# Patient Record
Sex: Male | Born: 1970 | Race: Black or African American | Hispanic: No | Marital: Single | Smoking: Current every day smoker
Health system: Southern US, Community
[De-identification: ages and names within clinical notes are randomized; demographics above are authoritative.]

## PROBLEM LIST (undated history)

## (undated) ENCOUNTER — Emergency Department (HOSPITAL_BASED_OUTPATIENT_CLINIC_OR_DEPARTMENT_OTHER): Admission: EM | Payer: Commercial Managed Care - HMO | Source: Home / Self Care

## (undated) DIAGNOSIS — I2510 Atherosclerotic heart disease of native coronary artery without angina pectoris: Secondary | ICD-10-CM

## (undated) DIAGNOSIS — I509 Heart failure, unspecified: Secondary | ICD-10-CM

## (undated) DIAGNOSIS — I5022 Chronic systolic (congestive) heart failure: Secondary | ICD-10-CM

## (undated) DIAGNOSIS — I10 Essential (primary) hypertension: Secondary | ICD-10-CM

## (undated) HISTORY — PX: ABDOMINAL SURGERY: SHX537

---

## 2012-10-15 ENCOUNTER — Encounter (HOSPITAL_BASED_OUTPATIENT_CLINIC_OR_DEPARTMENT_OTHER): Payer: Self-pay | Admitting: *Deleted

## 2012-10-15 ENCOUNTER — Emergency Department (HOSPITAL_BASED_OUTPATIENT_CLINIC_OR_DEPARTMENT_OTHER)
Admission: EM | Admit: 2012-10-15 | Discharge: 2012-10-16 | Disposition: A | Discharge status: Home / Self Care | Payer: No Typology Code available for payment source | Attending: Emergency Medicine | Admitting: Emergency Medicine

## 2012-10-15 ENCOUNTER — Emergency Department (HOSPITAL_BASED_OUTPATIENT_CLINIC_OR_DEPARTMENT_OTHER): Payer: No Typology Code available for payment source

## 2012-10-15 DIAGNOSIS — F17200 Nicotine dependence, unspecified, uncomplicated: Secondary | ICD-10-CM | POA: Insufficient documentation

## 2012-10-15 DIAGNOSIS — S139XXA Sprain of joints and ligaments of unspecified parts of neck, initial encounter: Secondary | ICD-10-CM | POA: Insufficient documentation

## 2012-10-15 DIAGNOSIS — S39012A Strain of muscle, fascia and tendon of lower back, initial encounter: Secondary | ICD-10-CM

## 2012-10-15 DIAGNOSIS — Y9389 Activity, other specified: Secondary | ICD-10-CM | POA: Insufficient documentation

## 2012-10-15 DIAGNOSIS — Z79899 Other long term (current) drug therapy: Secondary | ICD-10-CM | POA: Insufficient documentation

## 2012-10-15 DIAGNOSIS — S239XXA Sprain of unspecified parts of thorax, initial encounter: Secondary | ICD-10-CM | POA: Insufficient documentation

## 2012-10-15 DIAGNOSIS — I10 Essential (primary) hypertension: Secondary | ICD-10-CM | POA: Insufficient documentation

## 2012-10-15 DIAGNOSIS — S161XXA Strain of muscle, fascia and tendon at neck level, initial encounter: Secondary | ICD-10-CM

## 2012-10-15 DIAGNOSIS — S335XXA Sprain of ligaments of lumbar spine, initial encounter: Secondary | ICD-10-CM | POA: Insufficient documentation

## 2012-10-15 DIAGNOSIS — S29019A Strain of muscle and tendon of unspecified wall of thorax, initial encounter: Secondary | ICD-10-CM

## 2012-10-15 DIAGNOSIS — Y92410 Unspecified street and highway as the place of occurrence of the external cause: Secondary | ICD-10-CM | POA: Insufficient documentation

## 2012-10-15 HISTORY — DX: Essential (primary) hypertension: I10
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
## 2012-10-16 MED ORDER — CYCLOBENZAPRINE HCL 10 MG PO TABS: 10.0000 mg | ORAL_TABLET | Freq: Three times a day (TID) | ORAL | Status: DC | PRN

## 2013-05-22 ENCOUNTER — Emergency Department (HOSPITAL_BASED_OUTPATIENT_CLINIC_OR_DEPARTMENT_OTHER)
Admission: EM | Admit: 2013-05-22 | Discharge: 2013-05-23 | Disposition: A | Discharge status: Home / Self Care | Payer: Self-pay | Attending: Emergency Medicine | Admitting: Emergency Medicine

## 2013-05-22 ENCOUNTER — Encounter (HOSPITAL_BASED_OUTPATIENT_CLINIC_OR_DEPARTMENT_OTHER): Payer: Self-pay | Admitting: Emergency Medicine

## 2013-05-22 DIAGNOSIS — Z79899 Other long term (current) drug therapy: Secondary | ICD-10-CM | POA: Insufficient documentation

## 2013-05-22 DIAGNOSIS — I10 Essential (primary) hypertension: Secondary | ICD-10-CM | POA: Insufficient documentation

## 2013-05-22 DIAGNOSIS — R071 Chest pain on breathing: Secondary | ICD-10-CM | POA: Insufficient documentation

## 2013-05-22 DIAGNOSIS — R0781 Pleurodynia: Secondary | ICD-10-CM

## 2013-05-22 DIAGNOSIS — F17200 Nicotine dependence, unspecified, uncomplicated: Secondary | ICD-10-CM | POA: Insufficient documentation

## 2013-05-22 LAB — PROTIME-INR
INR: 1.33 (ref 0.00–1.49)
Prothrombin Time: 16.2 seconds — ABNORMAL HIGH (ref 11.6–15.2)

## 2013-05-22 LAB — CBC WITH DIFFERENTIAL/PLATELET
Basophils Absolute: 0 10*3/uL (ref 0.0–0.1)
Basophils Relative: 0 % (ref 0–1)
Eosinophils Absolute: 0.2 10*3/uL (ref 0.0–0.7)
Eosinophils Relative: 4 % (ref 0–5)
HCT: 42.3 % (ref 39.0–52.0)
Hemoglobin: 14.4 g/dL (ref 13.0–17.0)
Lymphocytes Relative: 43 % (ref 12–46)
Lymphs Abs: 2.6 10*3/uL (ref 0.7–4.0)
MCH: 28.6 pg (ref 26.0–34.0)
MCHC: 34 g/dL (ref 30.0–36.0)
MCV: 83.9 fL (ref 78.0–100.0)
Monocytes Absolute: 0.8 10*3/uL (ref 0.1–1.0)
Monocytes Relative: 13 % — ABNORMAL HIGH (ref 3–12)
Neutro Abs: 2.4 10*3/uL (ref 1.7–7.7)
Neutrophils Relative %: 41 % — ABNORMAL LOW (ref 43–77)
Platelets: 207 10*3/uL (ref 150–400)
RBC: 5.04 MIL/uL (ref 4.22–5.81)
RDW: 14.7 % (ref 11.5–15.5)
WBC: 6 10*3/uL (ref 4.0–10.5)

## 2013-05-22 NOTE — ED Notes (Signed)
Chest pain since Thursday- right chest- worse with deep breath and laughing- states got sweaty during one episode

## 2013-05-23 ENCOUNTER — Emergency Department (HOSPITAL_BASED_OUTPATIENT_CLINIC_OR_DEPARTMENT_OTHER): Payer: Self-pay

## 2013-05-23 LAB — COMPREHENSIVE METABOLIC PANEL
ALT: 15 U/L (ref 0–53)
AST: 15 U/L (ref 0–37)
Albumin: 4.1 g/dL (ref 3.5–5.2)
Alkaline Phosphatase: 85 U/L (ref 39–117)
BUN: 12 mg/dL (ref 6–23)
CO2: 26 mEq/L (ref 19–32)
Calcium: 9.7 mg/dL (ref 8.4–10.5)
Chloride: 100 mEq/L (ref 96–112)
Creatinine, Ser: 1.3 mg/dL (ref 0.50–1.35)
GFR calc Af Amer: 75 mL/min — ABNORMAL LOW (ref >90)
GFR calc non Af Amer: 65 mL/min — ABNORMAL LOW (ref >90)
Glucose, Bld: 99 mg/dL (ref 70–99)
Potassium: 4 mEq/L (ref 3.7–5.3)
Sodium: 141 mEq/L (ref 137–147)
Total Bilirubin: 0.3 mg/dL (ref 0.3–1.2)
Total Protein: 8.6 g/dL — ABNORMAL HIGH (ref 6.0–8.3)

## 2013-05-23 LAB — TROPONIN I: Troponin I: <0.3 ng/mL (ref <0.30)

## 2013-05-23 LAB — D-DIMER, QUANTITATIVE: D-Dimer, Quant: 1.42 ug/mL-FEU — ABNORMAL HIGH (ref 0.00–0.48)

## 2013-05-23 MED ORDER — IOHEXOL 350 MG/ML SOLN
100.0000 mL | Freq: Once | INTRAVENOUS | Status: AC | PRN
  Administered 2013-05-23: 100 mL via INTRAVENOUS

## 2013-05-23 NOTE — ED Provider Notes (Addendum)
CSN: [id number]     Arrival date & time 05/22/13  2302 History   First MD Initiated Contact with Patient 05/23/13 0122     Chief Complaint  Patient presents with  . 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 He is here with chest pain for the past 4 days. The pain is on the right side anteriorly. The pain is pleuritic and he only feels it with deep breathing, laughing or coughing. He has had only an occasional cough, nonproductive. He has not had a fever that he is aware of but 3 days ago did have alternating feelings of warmth and chills. This lasted one day. The pain is moderate at worst. There is no associated shortness of breath, nausea, vomiting or diaphoresis. He denies lower extremity pain or swelling. He has no history of DVT.  Past Medical History  Diagnosis Date  . Hypertension    History reviewed. No pertinent past surgical history. No family history on file. History  Substance Use Topics  . Smoking status: Current Every Day Smoker    Types: Cigarettes  . Smokeless tobacco: Never Used  . Alcohol Use: 1.2 oz/week    2 Cans of beer per week    Review of Systems  All other systems reviewed and are negative.    Allergies  Review of patient's allergies indicates no known allergies.  Home Medications   Current Outpatient Rx  Name  Route  Sig  Dispense  Refill  . cyclobenzaprine (FLEXERIL) 10 MG tablet   Oral   Take 1 tablet (10 mg total) by mouth 3 (three) times daily as needed for muscle spasms.   15 tablet   0   . hydrochlorothiazide (HYDRODIURIL) 25 MG tablet   Oral   Take 25 mg by mouth daily.          BP 157/84  Pulse 97  Temp(Src) 99.1 F (37.3 C) (Oral)  Resp 17  Ht 6\' 2"  (1.88 m)  Wt 270 lb (122.471 kg)  BMI 34.65 kg/m2  SpO2 99%  Physical Exam General: Well-developed,  well-nourished male in no acute distress; appearance consistent with age of record HENT: normocephalic; atraumatic Eyes: pupils equal, round and reactive to light; extraocular muscles intact Neck: supple Heart: regular rate and rhythm; no murmurs, rubs or gallops Lungs: clear to auscultation bilaterally Chest: non-tender Abdomen: soft; nondistended; nontender; no masses or hepatosplenomegaly; bowel sounds present Extremities: No deformity; full range of motion; pulses normal; no edema Neurologic: Awake, alert and oriented; motor function intact in all extremities and symmetric; no facial droop Skin: Warm and dry Psychiatric: Normal mood and affect    ED Course  Procedures (including critical care time)  MDM    EKG Interpretation  Date/Time:  Sunday May 22 2013 23:12:00 EDT Ventricular Rate:  99 PR Interval:  158 QRS Duration: 86 QT Interval:  326 QTC Calculation: 418 R Axis:   28 Text Interpretation:  Normal sinus rhythm Possible Left atrial enlargement T wave abnormality, consider inferolateral ischemia Abnormal ECG No previous ECGs available Confirmed by Keishon Chavarin  MD, Jenny Reichmann ([id number]) on 05/23/2013 1:23:32 AM       Nursing notes and vitals signs, including pulse oximetry, reviewed.  Summary of this visit's results, reviewed by myself:  Labs:  Results for orders placed during the hospital encounter of 05/22/13 (from the past 24 hour(s))  CBC WITH DIFFERENTIAL     Status:  Abnormal   Collection Time    05/22/13 11:35 PM      Result Value Ref Range   WBC 6.0  4.0 - 10.5 K/uL   RBC 5.04  4.22 - 5.81 MIL/uL   Hemoglobin 14.4  13.0 - 17.0 g/dL   HCT 42.3  39.0 - 52.0 %   MCV 83.9  78.0 - 100.0 fL   MCH 28.6  26.0 - 34.0 pg   MCHC 34.0  30.0 - 36.0 g/dL   RDW 14.7  11.5 - 15.5 %   Platelets 207  150 - 400 K/uL   Neutrophils Relative % 41 (*) 43 - 77 %   Neutro Abs 2.4  1.7 - 7.7 K/uL   Lymphocytes Relative 43  12 - 46 %   Lymphs Abs 2.6  0.7 - 4.0 K/uL   Monocytes  Relative 13 (*) 3 - 12 %   Monocytes Absolute 0.8  0.1 - 1.0 K/uL   Eosinophils Relative 4  0 - 5 %   Eosinophils Absolute 0.2  0.0 - 0.7 K/uL   Basophils Relative 0  0 - 1 %   Basophils Absolute 0.0  0.0 - 0.1 K/uL  COMPREHENSIVE METABOLIC PANEL     Status: Abnormal   Collection Time    05/22/13 11:35 PM      Result Value Ref Range   Sodium 141  137 - 147 mEq/L   Potassium 4.0  3.7 - 5.3 mEq/L   Chloride 100  96 - 112 mEq/L   CO2 26  19 - 32 mEq/L   Glucose, Bld 99  70 - 99 mg/dL   BUN 12  6 - 23 mg/dL   Creatinine, Ser 1.30  0.50 - 1.35 mg/dL   Calcium 9.7  8.4 - 10.5 mg/dL   Total Protein 8.6 (*) 6.0 - 8.3 g/dL   Albumin 4.1  3.5 - 5.2 g/dL   AST 15  0 - 37 U/L   ALT 15  0 - 53 U/L   Alkaline Phosphatase 85  39 - 117 U/L   Total Bilirubin 0.3  0.3 - 1.2 mg/dL   GFR calc non Af Amer 65 (*) >90 mL/min   GFR calc Af Amer 75 (*) >90 mL/min  TROPONIN I     Status: None   Collection Time    05/22/13 11:35 PM      Result Value Ref Range   Troponin I <0.30  <0.30 ng/mL  PROTIME-INR     Status: Abnormal   Collection Time    05/22/13 11:35 PM      Result Value Ref Range   Prothrombin Time 16.2 (*) 11.6 - 15.2 seconds   INR 1.33  0.00 - 1.49  D-DIMER, QUANTITATIVE     Status: Abnormal   Collection Time    05/22/13 11:35 PM      Result Value Ref Range   D-Dimer, Quant 1.42 (*) 0.00 - 0.48 ug/mL-FEU   Will obtain CT Angio Chest due to pleuritic pain with elevated D-Dimer.  Imaging Studies: Dg Chest 2 View  05/23/2013   CLINICAL DATA:  Chest pain. Increasing with deep inspiration and laughing.  EXAM: CHEST  2 VIEW  COMPARISON:  No priors.  FINDINGS: Mild diffuse peribronchial cuffing and interstitial prominence. No acute consolidative airspace disease. No pleural effusions. No evidence of pulmonary edema. Heart size is normal. Mediastinal contours are unremarkable.  IMPRESSION: 1. Findings, as above, suggestive of mild acute bronchitis.   Electronically Signed   By: Quillian Quince  Entrikin M.D.   On: 05/23/2013 02:05   Ct Angio Chest W/cm &/or Wo Cm  05/23/2013   CLINICAL DATA:  Chest pain.  EXAM: CT ANGIOGRAPHY CHEST WITH CONTRAST  TECHNIQUE: Multidetector CT imaging of the chest was performed using the standard protocol during bolus administration of intravenous contrast. Multiplanar CT image reconstructions and MIPs were obtained to evaluate the vascular anatomy.  CONTRAST:  143mL OMNIPAQUE IOHEXOL 350 MG/ML SOLN  COMPARISON:  No priors.  FINDINGS: Mediastinum: No filling defects within the pulmonary arterial tree to suggest underlying pulmonary embolism. Heart size is normal. Probable left ventricular hypertrophy. Numerous borderline enlarged mediastinal lymph nodes. No definite pathologically enlarged mediastinal or hilar lymph nodes are noted. Esophagus is unremarkable in appearance.  Lungs/Pleura: Unremarkable. Description small amount of dependent atelectasis in the lower lobes of the lungs bilaterally. No consolidative airspace disease. No pleural effusions. No suspicious appearing pulmonary nodules or masses are identified.  Upper Abdomen: Unremarkable.  Musculoskeletal: There are no aggressive appearing lytic or blastic lesions noted in the visualized portions of the skeleton.  Review of the MIP images confirms the above findings.  IMPRESSION: 1. No evidence of pulmonary embolism. 2. No acute findings in the thorax to account for the patient's symptoms. 3. Probable left ventricular hypertrophy. This could be confirmed with followup nonemergent echocardiogram if clinically appropriate.   Electronically Signed   By: Vinnie Langton M.D.   On: 05/23/2013 03:07   3:12 AM No evidence of pulmonary embolism on CT. advised CT findings of enlarged heart which is likely the cause of his T wave inversions. He states he has PCP with whom he will follow up.     Wynetta Fines, MD 05/23/13 [id number]  Wynetta Fines, MD 05/23/13 [place], MD 05/23/13 [phone]

## 2013-05-23 NOTE — Discharge Instructions (Signed)

## 2014-12-20 ENCOUNTER — Encounter (HOSPITAL_BASED_OUTPATIENT_CLINIC_OR_DEPARTMENT_OTHER): Payer: Self-pay

## 2014-12-20 ENCOUNTER — Emergency Department (HOSPITAL_BASED_OUTPATIENT_CLINIC_OR_DEPARTMENT_OTHER)
Admission: EM | Admit: 2014-12-20 | Discharge: 2014-12-20 | Disposition: A | Discharge status: Home / Self Care | Payer: Self-pay | Attending: Emergency Medicine | Admitting: Emergency Medicine

## 2014-12-20 ENCOUNTER — Emergency Department (HOSPITAL_BASED_OUTPATIENT_CLINIC_OR_DEPARTMENT_OTHER): Payer: Self-pay

## 2014-12-20 DIAGNOSIS — I10 Essential (primary) hypertension: Secondary | ICD-10-CM | POA: Insufficient documentation

## 2014-12-20 DIAGNOSIS — S3992XA Unspecified injury of lower back, initial encounter: Secondary | ICD-10-CM | POA: Insufficient documentation

## 2014-12-20 DIAGNOSIS — Y92410 Unspecified street and highway as the place of occurrence of the external cause: Secondary | ICD-10-CM | POA: Insufficient documentation

## 2014-12-20 DIAGNOSIS — Y9389 Activity, other specified: Secondary | ICD-10-CM | POA: Insufficient documentation

## 2014-12-20 DIAGNOSIS — T25221A Burn of second degree of right foot, initial encounter: Secondary | ICD-10-CM | POA: Insufficient documentation

## 2014-12-20 DIAGNOSIS — Z720 Tobacco use: Secondary | ICD-10-CM | POA: Insufficient documentation

## 2014-12-20 DIAGNOSIS — T300 Burn of unspecified body region, unspecified degree: Secondary | ICD-10-CM

## 2014-12-20 DIAGNOSIS — Y998 Other external cause status: Secondary | ICD-10-CM | POA: Insufficient documentation

## 2014-12-20 DIAGNOSIS — X100XXA Contact with hot drinks, initial encounter: Secondary | ICD-10-CM | POA: Insufficient documentation

## 2014-12-20 MED ORDER — HYDROCHLOROTHIAZIDE 25 MG PO TABS: 25.0000 mg | ORAL_TABLET | Freq: Every day | ORAL | Status: DC

## 2014-12-20 MED ORDER — HYDROCHLOROTHIAZIDE 25 MG PO TABS
25.0000 mg | ORAL_TABLET | Freq: Once | ORAL | Status: AC
  Administered 2014-12-20: 25 mg via ORAL
  Filled 2014-12-20: qty 1

## 2014-12-20 MED ORDER — TETANUS-DIPHTH-ACELL PERTUSSIS 5-2.5-18.5 LF-MCG/0.5 IM SUSP: 0.5000 mL | Freq: Once | INTRAMUSCULAR | Status: DC

## 2014-12-20 MED ORDER — BACITRACIN ZINC 500 UNIT/GM EX OINT
TOPICAL_OINTMENT | Freq: Two times a day (BID) | CUTANEOUS | Status: DC
  Administered 2014-12-20: 1 via TOPICAL
  Filled 2014-12-20: qty 28.35

## 2014-12-20 MED ORDER — CEPHALEXIN 500 MG PO CAPS: 500.0000 mg | ORAL_CAPSULE | Freq: Four times a day (QID) | ORAL | Status: DC

## 2014-12-20 MED ORDER — BACITRACIN ZINC 500 UNIT/GM EX OINT: 1.0000 "application " | TOPICAL_OINTMENT | Freq: Two times a day (BID) | CUTANEOUS | Status: DC

## 2014-12-20 NOTE — ED Notes (Signed)
Pt denies ha, dizziness, eye pain or visual disturbances.
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
## 2014-12-20 NOTE — ED Notes (Signed)
Dr Wyvonnia Dusky aware of BP being elevated.

## 2015-06-06 ENCOUNTER — Encounter (HOSPITAL_BASED_OUTPATIENT_CLINIC_OR_DEPARTMENT_OTHER): Payer: Self-pay

## 2015-06-06 ENCOUNTER — Emergency Department (HOSPITAL_BASED_OUTPATIENT_CLINIC_OR_DEPARTMENT_OTHER)
Admission: EM | Admit: 2015-06-06 | Discharge: 2015-06-06 | Disposition: A | Discharge status: Home / Self Care | Payer: No Typology Code available for payment source | Attending: Emergency Medicine | Admitting: Emergency Medicine

## 2015-06-06 DIAGNOSIS — I10 Essential (primary) hypertension: Secondary | ICD-10-CM | POA: Insufficient documentation

## 2015-06-06 DIAGNOSIS — F17210 Nicotine dependence, cigarettes, uncomplicated: Secondary | ICD-10-CM | POA: Insufficient documentation

## 2015-06-06 DIAGNOSIS — J3489 Other specified disorders of nose and nasal sinuses: Secondary | ICD-10-CM | POA: Insufficient documentation

## 2015-06-06 MED ORDER — DIPHENHYDRAMINE HCL 25 MG PO TABS: 25.0000 mg | ORAL_TABLET | Freq: Four times a day (QID) | ORAL | Status: DC

## 2015-06-06 MED ORDER — HYDROCHLOROTHIAZIDE 25 MG PO TABS: 25.0000 mg | ORAL_TABLET | Freq: Every day | ORAL | Status: DC

## 2015-06-06 MED ORDER — FLUTICASONE PROPIONATE 50 MCG/ACT NA SUSP: 2.0000 | Freq: Every day | NASAL | Status: DC

## 2015-06-06 MED FILL — DIPHENHYDRAMINE 25 MG CAP: 25 | 25 days supply | Qty: 100 | Fill #0

## 2015-06-06 MED FILL — SM ALLERGY RELIEF 50 MCG SP: 50 MCG | 30 days supply | Qty: 16 | Fill #0

## 2015-06-06 MED FILL — HYDROCHLOROTHIAZIDE 25 MG T: 25 | 30 days supply | Qty: 30 | Fill #0

## 2015-06-06 NOTE — Discharge Instructions (Signed)
Hypertension Hypertension, commonly called high blood pressure, is when the force of blood pumping through your arteries is too strong. Your arteries are the blood vessels that carry blood from your heart throughout your body. A blood pressure reading consists of a higher number over a lower number, such as 110/72. The higher number (systolic) is the pressure inside your arteries when your heart pumps. The lower number (diastolic) is the pressure inside your arteries when your heart relaxes. Ideally you want your blood pressure below 120/80. Hypertension forces your heart to work harder to pump blood. Your arteries may become narrow or stiff. Having untreated or uncontrolled hypertension can cause heart attack, stroke, kidney disease, and other problems. RISK FACTORS Some risk factors for high blood pressure are controllable. Others are not.  Risk factors you cannot control include:   Race. You may be at higher risk if you are African American.  Age. Risk increases with age.  Gender. Men are at higher risk than women before age 45 years. After age 65, women are at higher risk than men. Risk factors you can control include:  Not getting enough exercise or physical activity.  Being overweight.  Getting too much fat, sugar, calories, or salt in your diet.  Drinking too much alcohol. SIGNS AND SYMPTOMS Hypertension does not usually cause signs or symptoms. Extremely high blood pressure (hypertensive crisis) may cause headache, anxiety, shortness of breath, and nosebleed. DIAGNOSIS To check if you have hypertension, your health care provider will measure your blood pressure while you are seated, with your arm held at the level of your heart. It should be measured at least twice using the same arm. Certain conditions can cause a difference in blood pressure between your right and left arms. A blood pressure reading that is higher than normal on one occasion does not mean that you need treatment. If  it is not clear whether you have high blood pressure, you may be asked to return on a different day to have your blood pressure checked again. Or, you may be asked to monitor your blood pressure at home for 1 or more weeks. TREATMENT Treating high blood pressure includes making lifestyle changes and possibly taking medicine. Living a healthy lifestyle can help lower high blood pressure. You may need to change some of your habits. Lifestyle changes may include:  Following the DASH diet. This diet is high in fruits, vegetables, and whole grains. It is low in salt, red meat, and added sugars.  Keep your sodium intake below 2,300 mg per day.  Getting at least 30-45 minutes of aerobic exercise at least 4 times per week.  Losing weight if necessary.  Not smoking.  Limiting alcoholic beverages.  Learning ways to reduce stress. Your health care provider may prescribe medicine if lifestyle changes are not enough to get your blood pressure under control, and if one of the following is true:  You are 18-59 years of age and your systolic blood pressure is above 140.  You are 60 years of age or older, and your systolic blood pressure is above 150.  Your diastolic blood pressure is above 90.  You have diabetes, and your systolic blood pressure is over 140 or your diastolic blood pressure is over 90.  You have kidney disease and your blood pressure is above 140/90.  You have heart disease and your blood pressure is above 140/90. Your personal target blood pressure may vary depending on your medical conditions, your age, and other factors. HOME CARE INSTRUCTIONS    Have your blood pressure rechecked as directed by your health care provider.   Take medicines only as directed by your health care provider. Follow the directions carefully. Blood pressure medicines must be taken as prescribed. The medicine does not work as well when you skip doses. Skipping doses also puts you at risk for  problems.  Do not smoke.   Monitor your blood pressure at home as directed by your health care provider. SEEK MEDICAL CARE IF:   You think you are having a reaction to medicines taken.  You have recurrent headaches or feel dizzy.  You have swelling in your ankles.  You have trouble with your vision. SEEK IMMEDIATE MEDICAL CARE IF:  You develop a severe headache or confusion.  You have unusual weakness, numbness, or feel faint.  You have severe chest or abdominal pain.  You vomit repeatedly.  You have trouble breathing. MAKE SURE YOU:   Understand these instructions.  Will watch your condition.  Will get help right away if you are not doing well or get worse.   This information is not intended to replace advice given to you by your health care provider. Make sure you discuss any questions you have with your health care provider.   Document Released: 02/17/2005 Document Revised: 07/04/2014 Document Reviewed: 12/10/2012 Elsevier Interactive Patient Education 2016 [employer]  

## 2015-06-06 NOTE — ED Notes (Signed)
C/o head congestion, sinus pain x 1 week

## 2015-06-06 NOTE — ED Notes (Signed)
Pt wanting refill for home BP medications.  Pt states that he cannot "get to his doctor right now".

## 2015-06-06 NOTE — ED Provider Notes (Signed)
CSN: [id number]     Arrival date & time 06/06/15  1207 History   First MD Initiated Contact with Patient 06/06/15 1224     Chief Complaint  Patient presents with  . Nasal Congestion     HPI Patient presents to the emergency department with complaints of nasal congestion over the past 2 weeks.  He states fullness sensation in his bilateral maxillary sinuses as well as radiating towards his bilateral ears.  He's had nasal congestion as well.  Denies sore throat.  No significant cough.  No chest pain shortness breath.  Denies abdominal pain.  He also requests a refill of his blood pressure medication.  He previously was on 25 mg hydrochlorothiazide daily.   Past Medical History  Diagnosis Date  . Hypertension    History reviewed. No pertinent past surgical history. No family history on file. Social History  Substance Use Topics  . Smoking status: Current Every Day Smoker -- 0.50 packs/day    Types: Cigarettes  . Smokeless tobacco: Never Used  . Alcohol Use: Yes     Comment: weekly    Review of Systems  All other systems reviewed and are negative.     Allergies  Review of patient's allergies indicates no known allergies.  Home Medications   Prior to Admission medications   Not on File   BP 184/123 mmHg  Pulse 93  Temp(Src) 98 F (36.7 C) (Oral)  Resp 18  Ht 6\' 1"  (1.854 m)  Wt 268 lb (121.564 kg)  BMI 35.37 kg/m2  SpO2 100% Physical Exam  Constitutional: He is oriented to person, place, and time. He appears well-developed and well-nourished.  HENT:  Head: Normocephalic and atraumatic.  Right Ear: External ear normal.  Left Ear: External ear normal.  Bilateral TMs normal.  No significant maxillary or frontal sinus tenderness.  No facial swelling.  Posterior pharynx is normal in appearance  Eyes: EOM are normal.  Neck: Normal range of motion.  Cardiovascular: Normal rate and regular rhythm.   Pulmonary/Chest: Effort normal and breath sounds normal. No respiratory  distress.  Abdominal: Soft. He exhibits no distension.  Musculoskeletal: Normal range of motion.  Neurological: He is alert and oriented to person, place, and time.  Skin: Skin is warm and dry.  Psychiatric: He has a normal mood and affect. Judgment normal.  Nursing note and vitals reviewed.   ED Course  Procedures (including critical care time) Labs Review Labs Reviewed - No data to display  Imaging Review No results found. I have personally reviewed and evaluated these images and lab results as part of my medical decision-making.   EKG Interpretation None      MDM   Final diagnoses:  None    Patient be placed on nasal decongestant.  No indication for antibiotics.  Doubt bacterial sinusitis.  Patient is given a refill of his hydrochlorothiazide.  I've informed him that he will need to see her primary care physician for ongoing prescriptions.    Jola Schmidt, MD 06/06/15 1250

## 2015-12-09 ENCOUNTER — Emergency Department (HOSPITAL_COMMUNITY)
Admission: EM | Admit: 2015-12-09 | Discharge: 2015-12-09 | Disposition: A | Discharge status: Home / Self Care | Payer: Self-pay | Attending: Emergency Medicine | Admitting: Emergency Medicine

## 2015-12-09 ENCOUNTER — Encounter (HOSPITAL_COMMUNITY): Payer: Self-pay | Admitting: *Deleted

## 2015-12-09 ENCOUNTER — Emergency Department (HOSPITAL_COMMUNITY): Payer: Self-pay

## 2015-12-09 DIAGNOSIS — K2900 Acute gastritis without bleeding: Secondary | ICD-10-CM | POA: Insufficient documentation

## 2015-12-09 DIAGNOSIS — F17210 Nicotine dependence, cigarettes, uncomplicated: Secondary | ICD-10-CM | POA: Insufficient documentation

## 2015-12-09 DIAGNOSIS — I10 Essential (primary) hypertension: Secondary | ICD-10-CM | POA: Insufficient documentation

## 2015-12-09 LAB — CBC
HCT: 40.1 % (ref 39.0–52.0)
Hemoglobin: 12.9 g/dL — ABNORMAL LOW (ref 13.0–17.0)
MCH: 27.3 pg (ref 26.0–34.0)
MCHC: 32.2 g/dL (ref 30.0–36.0)
MCV: 84.8 fL (ref 78.0–100.0)
Platelets: 257 10*3/uL (ref 150–400)
RBC: 4.73 MIL/uL (ref 4.22–5.81)
RDW: 14.6 % (ref 11.5–15.5)
WBC: 6.8 10*3/uL (ref 4.0–10.5)

## 2015-12-09 LAB — BASIC METABOLIC PANEL
Anion gap: 11 (ref 5–15)
BUN: 13 mg/dL (ref 6–20)
CO2: 24 mmol/L (ref 22–32)
Calcium: 8.7 mg/dL — ABNORMAL LOW (ref 8.9–10.3)
Chloride: 104 mmol/L (ref 101–111)
Creatinine, Ser: 1.27 mg/dL — ABNORMAL HIGH (ref 0.61–1.24)
GFR calc Af Amer: >60 mL/min (ref >60)
GFR calc non Af Amer: >60 mL/min (ref >60)
Glucose, Bld: 96 mg/dL (ref 65–99)
Potassium: 3.3 mmol/L — ABNORMAL LOW (ref 3.5–5.1)
Sodium: 139 mmol/L (ref 135–145)

## 2015-12-09 LAB — ETHANOL: Alcohol, Ethyl (B): 186 mg/dL — ABNORMAL HIGH (ref <5)

## 2015-12-09 LAB — TROPONIN I
Troponin I: <0.03 ng/mL (ref <0.03)
Troponin I: <0.03 ng/mL (ref <0.03)

## 2015-12-09 MED ORDER — SODIUM CHLORIDE 0.9 % IV BOLUS (SEPSIS)
1000.0000 mL | Freq: Once | INTRAVENOUS | Status: AC
  Administered 2015-12-09: 1000 mL via INTRAVENOUS

## 2015-12-09 MED ORDER — GI COCKTAIL ~~LOC~~
30.0000 mL | Freq: Once | ORAL | Status: AC
  Administered 2015-12-09: 30 mL via ORAL
  Filled 2015-12-09: qty 30

## 2015-12-09 MED ORDER — OMEPRAZOLE 20 MG PO CPDR: 20.0000 mg | DELAYED_RELEASE_CAPSULE | Freq: Every day | ORAL | 0 refills | Status: DC

## 2015-12-09 NOTE — ED Provider Notes (Signed)
[facility] DEPT Provider Note   CSN: [id number] Arrival date & time: 12/09/15  0241  By signing my name below, I, Clinton Johnson, attest that this documentation has been prepared under the direction and in the presence of Sema Stangler, MD. Electronically Signed: Hansel Johnson, ED Scribe. 12/09/15. 3:17 AM.     History   Chief Complaint Chief Complaint  Patient presents with  . Chest Pain    HPI Clinton Johnson is a 45 y.o. male with h/o HTN who presents to the Emergency Department complaining of moderate, substernal CP onset at 2:10 AM after eating a hot dog. Pt states he left the club and ate a hot dog, which was followed by immediate pain. He states he consumed large amounts of alcohol this evening (~15 crown and cokes). He denies illegal drug use. No daily medications. He denies vomiting, abdominal pain.   The history is provided by the patient. No language interpreter was used.  Chest Pain   This is a recurrent problem. The current episode started 1 to 2 hours ago. The problem occurs constantly. The problem has not changed since onset.The pain is associated with eating. The pain is present in the substernal region. The pain is moderate. The pain does not radiate. Duration of episode(s) is 1 hour. Pertinent negatives include no abdominal pain, no back pain, no claudication, no cough, no diaphoresis, no exertional chest pressure, no fever, no headaches, no hemoptysis, no irregular heartbeat, no leg pain, no lower extremity edema, no malaise/fatigue, no near-syncope, no numbness, no orthopnea, no palpitations, no shortness of breath, no sputum production, no syncope, no vomiting and no weakness. He has tried nothing for the symptoms. The treatment provided no relief. Risk factors include alcohol intake.  His past medical history is significant for hypertension.  Pertinent negatives for past medical history include no aneurysm and no Marfan's syndrome.  Pertinent negatives for family medical  history include: no aortic dissection.  Procedure history is negative for cardiac catheterization.    Past Medical History:  Diagnosis Date  . Hypertension     There are no active problems to display for this patient.   History reviewed. No pertinent surgical history.     Home Medications    Prior to Admission medications   Medication Sig Start Date End Date Taking? Authorizing Provider  diphenhydrAMINE (BENADRYL) 25 MG tablet Take 1 tablet (25 mg total) by mouth every 6 (six) hours. 06/06/15   Jola Schmidt, MD  fluticasone [facility]) 50 MCG/ACT nasal spray Place 2 sprays into both nostrils daily. 06/06/15   Jola Schmidt, MD  hydrochlorothiazide (HYDRODIURIL) 25 MG tablet Take 1 tablet (25 mg total) by mouth daily. 06/06/15   Jola Schmidt, MD    Family History No family history on file.  Social History Social History  Substance Use Topics  . Smoking status: Current Every Day Smoker    Packs/day: 0.50    Types: Cigarettes  . Smokeless tobacco: Never Used  . Alcohol use Yes     Comment: weekly     Allergies   Review of patient's allergies indicates no known allergies.   Review of Systems Review of Systems  Constitutional: Negative for diaphoresis, fever and malaise/fatigue.  Respiratory: Negative for cough, hemoptysis, sputum production, chest tightness and shortness of breath.   Cardiovascular: Positive for chest pain. Negative for palpitations, orthopnea, claudication, leg swelling, syncope and near-syncope.  Gastrointestinal: Negative for abdominal pain, diarrhea and vomiting.  Musculoskeletal: Negative for arthralgias and back pain.  Neurological: Negative for weakness,  numbness and headaches.  All other systems reviewed and are negative.    Physical Exam Updated Vital Signs BP 121/83   Pulse 104   Temp 98.9 F (37.2 C) (Oral)   Resp 18   Ht 6\' 1"  (1.854 m)   Wt 270 lb (122.5 kg)   SpO2 96%   BMI 35.62 kg/m   Physical Exam  Constitutional: He is  oriented to person, place, and time. He appears well-developed and well-nourished.  Intoxicated  HENT:  Head: Normocephalic and atraumatic.  Mouth/Throat: Oropharynx is clear and moist. No oropharyngeal exudate.  Moist mucous membranes.   Eyes: Conjunctivae and EOM are normal.  Pinpoint pupils.   Neck: Normal range of motion. Neck supple. No JVD present. No tracheal deviation present.  No carotid bruits. Trachea midline.   Cardiovascular: Normal rate, regular rhythm, normal heart sounds and intact distal pulses.  Exam reveals no gallop and no friction rub.   No murmur heard. RRR.   Pulmonary/Chest: Effort normal and breath sounds normal. No stridor. No respiratory distress. He has no wheezes. He has no rales. He exhibits no tenderness.  Lungs CTA bilaterally.   Abdominal: Soft. He exhibits no distension and no mass. There is no tenderness. There is no rebound and no guarding.  Hyperactive bowel sounds throughout.   Musculoskeletal: Normal range of motion. He exhibits no edema or tenderness.  No BLE edema. PTs intact.   Lymphadenopathy:    He has no cervical adenopathy.  Neurological: He is alert and oriented to person, place, and time. He has normal reflexes.  DTRs intact.   Skin: Skin is warm and dry.  Psychiatric: He has a normal mood and affect.  Nursing note and vitals reviewed.    ED Treatments / Results   Vitals:   12/09/15 0445 12/09/15 0600  BP: 134/77 121/79  Pulse: 92   Resp: (!) 28 20  Temp:     Results for orders placed or performed during the hospital encounter of [id number]  Basic metabolic panel  Result Value Ref Range   Sodium 139 135 - 145 mmol/L   Potassium 3.3 (L) 3.5 - 5.1 mmol/L   Chloride 104 101 - 111 mmol/L   CO2 24 22 - 32 mmol/L   Glucose, Bld 96 65 - 99 mg/dL   BUN 13 6 - 20 mg/dL   Creatinine, Ser 1.27 (H) 0.61 - 1.24 mg/dL   Calcium 8.7 (L) 8.9 - 10.3 mg/dL   GFR calc non Af Amer >60 >60 mL/min   GFR calc Af Amer >60 >60 mL/min   Anion gap  11 5 - 15  CBC  Result Value Ref Range   WBC 6.8 4.0 - 10.5 K/uL   RBC 4.73 4.22 - 5.81 MIL/uL   Hemoglobin 12.9 (L) 13.0 - 17.0 g/dL   HCT 40.1 39.0 - 52.0 %   MCV 84.8 78.0 - 100.0 fL   MCH 27.3 26.0 - 34.0 pg   MCHC 32.2 30.0 - 36.0 g/dL   RDW 14.6 11.5 - 15.5 %   Platelets 257 150 - 400 K/uL  Troponin I  Result Value Ref Range   Troponin I <0.03 <0.03 ng/mL  Ethanol  Result Value Ref Range   Alcohol, Ethyl (B) 186 (H) <5 mg/dL  Troponin I  Result Value Ref Range   Troponin I <0.03 <0.03 ng/mL   Dg Chest 2 View  Result Date: 12/09/2015 CLINICAL DATA:  Moderate substernal chest pain beginning at 2:10 a.m. Immediate pain began after eating a  hot dog. Alcohol use. EXAM: CHEST  2 VIEW COMPARISON:  05/23/2013 FINDINGS: Slightly shallow inspiration with linear atelectasis in the right lung base. Heart size and pulmonary vascularity are normal. Central peribronchial thickening and interstitial changes consistent with chronic bronchitis. No focal airspace disease or consolidation. No blunting of costophrenic angles. No pneumothorax. Mediastinal contours appear intact. Tortuous aorta. IMPRESSION: Shallow inspiration with linear atelectasis in the right lung base. Chronic bronchitic changes. No evidence of active pulmonary disease. Electronically Signed   By: Lucienne Capers M.D.   On: 12/09/2015 03:33     DIAGNOSTIC STUDIES: Oxygen Saturation is 96% on RA, adequate by my interpretation.    COORDINATION OF CARE: 3:15 AM Discussed treatment plan with pt at bedside which includes lab work and pt agreed to plan.   EKG  EKG Interpretation  Date/Time:  Sunday December 09 2015 02:46:15 EDT Ventricular Rate:  108 PR Interval:  148 QRS Duration: 98 QT Interval:  348 QTC Calculation: 466 R Axis:   28 Text Interpretation:  Sinus tachycardia with Premature ventricular complexes non specific st changes Confirmed by [facility]  MD, Yliana Gravois ([id number]) on 12/09/2015 3:02:14 AM        Procedures Procedures (including critical care time)  Medications  gi cocktail (Maalox,Lidocaine,Donnatal) (30 mLs Oral Given 12/09/15 0345)  sodium chloride 0.9 % bolus 1,000 mL (0 mLs Intravenous Stopped 12/09/15 0445)     Initial Impression / Assessment and Plan / ED Course  I have reviewed the triage vital signs and the nursing notes.  Pertinent labs & imaging results that were available during my care of the patient were reviewed by me and considered in my medical decision making (see chart for details).  Clinical Course    Sleeping soundly without complaints since meds given  Final Clinical Impressions(s) / ED Diagnoses    New Prescriptions New Prescriptions   No medications on file  HEART score is 1, low risk for mace.  SX consistent with gastritis likely from a combination of ETOH mixed with hot dog.  Symptoms atypical for cardiac pain ruled out with 2 normal troponins.  PERC negative wells 0 highly doubt pe.  No ETOH. Bland diet and take prilosec x 1 month. All questions answered to patient's satisfaction. Based on history and exam patient has been appropriately medically screened and emergency conditions excluded. Patient is stable for discharge at this time. Follow up with your PMD for recheck in 2 days and strict return precautions given.   I personally performed the services described in this documentation, which was scribed in my presence. The recorded information has been reviewed and is accurate.      Veatrice Kells, MD 12/09/15 [phone]

## 2015-12-09 NOTE — ED Triage Notes (Signed)
The pt was at the club and as he was leaving he developed chest pain central  No mother symptoms  He is intoxicated.  No previous history.  He thinks it was the hot dog he ate

## 2019-12-05 ENCOUNTER — Other Ambulatory Visit: Payer: Self-pay

## 2019-12-05 ENCOUNTER — Encounter (HOSPITAL_BASED_OUTPATIENT_CLINIC_OR_DEPARTMENT_OTHER): Payer: Self-pay | Admitting: Emergency Medicine

## 2019-12-05 ENCOUNTER — Emergency Department (HOSPITAL_BASED_OUTPATIENT_CLINIC_OR_DEPARTMENT_OTHER): Payer: Self-pay

## 2019-12-05 ENCOUNTER — Emergency Department (HOSPITAL_BASED_OUTPATIENT_CLINIC_OR_DEPARTMENT_OTHER)
Admission: EM | Admit: 2019-12-05 | Discharge: 2019-12-05 | Discharge status: Against Medical Advice | Payer: Self-pay | Attending: Emergency Medicine | Admitting: Emergency Medicine

## 2019-12-05 DIAGNOSIS — Z79899 Other long term (current) drug therapy: Secondary | ICD-10-CM | POA: Insufficient documentation

## 2019-12-05 DIAGNOSIS — R1011 Right upper quadrant pain: Secondary | ICD-10-CM | POA: Insufficient documentation

## 2019-12-05 DIAGNOSIS — D135 Benign neoplasm of extrahepatic bile ducts: Secondary | ICD-10-CM

## 2019-12-05 DIAGNOSIS — F17210 Nicotine dependence, cigarettes, uncomplicated: Secondary | ICD-10-CM | POA: Insufficient documentation

## 2019-12-05 DIAGNOSIS — I110 Hypertensive heart disease with heart failure: Secondary | ICD-10-CM | POA: Insufficient documentation

## 2019-12-05 DIAGNOSIS — I509 Heart failure, unspecified: Secondary | ICD-10-CM

## 2019-12-05 DIAGNOSIS — K824 Cholesterolosis of gallbladder: Secondary | ICD-10-CM | POA: Insufficient documentation

## 2019-12-05 LAB — COMPREHENSIVE METABOLIC PANEL
ALT: 24 U/L (ref 0–44)
AST: 27 U/L (ref 15–41)
Albumin: 3.8 g/dL (ref 3.5–5.0)
Alkaline Phosphatase: 71 U/L (ref 38–126)
Anion gap: 11 (ref 5–15)
BUN: 12 mg/dL (ref 6–20)
CO2: 23 mmol/L (ref 22–32)
Calcium: 8.7 mg/dL — ABNORMAL LOW (ref 8.9–10.3)
Chloride: 102 mmol/L (ref 98–111)
Creatinine, Ser: 1.25 mg/dL — ABNORMAL HIGH (ref 0.61–1.24)
GFR calc Af Amer: >60 mL/min (ref >60)
GFR calc non Af Amer: >60 mL/min (ref >60)
Glucose, Bld: 94 mg/dL (ref 70–99)
Potassium: 4 mmol/L (ref 3.5–5.1)
Sodium: 136 mmol/L (ref 135–145)
Total Bilirubin: 1.1 mg/dL (ref 0.3–1.2)
Total Protein: 7.6 g/dL (ref 6.5–8.1)

## 2019-12-05 LAB — CBC WITH DIFFERENTIAL/PLATELET
Abs Immature Granulocytes: 0.02 10*3/uL (ref 0.00–0.07)
Basophils Absolute: 0.1 10*3/uL (ref 0.0–0.1)
Basophils Relative: 1 %
Eosinophils Absolute: 0.2 10*3/uL (ref 0.0–0.5)
Eosinophils Relative: 3 %
HCT: 44.1 % (ref 39.0–52.0)
Hemoglobin: 14.2 g/dL (ref 13.0–17.0)
Immature Granulocytes: 0 %
Lymphocytes Relative: 46 %
Lymphs Abs: 2.7 10*3/uL (ref 0.7–4.0)
MCH: 27.6 pg (ref 26.0–34.0)
MCHC: 32.2 g/dL (ref 30.0–36.0)
MCV: 85.8 fL (ref 80.0–100.0)
Monocytes Absolute: 0.7 10*3/uL (ref 0.1–1.0)
Monocytes Relative: 13 %
Neutro Abs: 2.2 10*3/uL (ref 1.7–7.7)
Neutrophils Relative %: 37 %
Platelets: 247 10*3/uL (ref 150–400)
RBC: 5.14 MIL/uL (ref 4.22–5.81)
RDW: 16 % — ABNORMAL HIGH (ref 11.5–15.5)
WBC: 5.8 10*3/uL (ref 4.0–10.5)
nRBC: 0.3 % — ABNORMAL HIGH (ref 0.0–0.2)

## 2019-12-05 LAB — LIPASE, BLOOD: Lipase: 25 U/L (ref 11–51)

## 2019-12-05 LAB — BRAIN NATRIURETIC PEPTIDE: B Natriuretic Peptide: 633.8 pg/mL — ABNORMAL HIGH (ref 0.0–100.0)

## 2019-12-05 MED ORDER — HYDRALAZINE HCL 20 MG/ML IJ SOLN
10.0000 mg | Freq: Once | INTRAMUSCULAR | Status: AC
  Administered 2019-12-05: 10 mg via INTRAVENOUS
  Filled 2019-12-05: qty 1

## 2019-12-05 MED ORDER — FUROSEMIDE 10 MG/ML IJ SOLN
40.0000 mg | Freq: Once | INTRAMUSCULAR | Status: AC
Start: 2019-12-05 — End: 2019-12-05
  Administered 2019-12-05: 40 mg via INTRAVENOUS
  Filled 2019-12-05: qty 4

## 2019-12-05 NOTE — ED Notes (Signed)
Pt refusing to stay, offered food, drinks, & snacks which pt also refused. States he is tired of waiting and will go to a different hospital "that will actually do something for me". Attempted to explain that he is receiving care here, that his BP is concerning, & every hospital system is overwhelmed/there will be a wait. Pt adamant that he is ready to leave, verbalized understanding that he is leaving against medical advice. IV removed.

## 2019-12-05 NOTE — ED Triage Notes (Signed)
Pt here with multiple complaints. Poor historian. States that he's been SOB, ankles are swelling, has HTN, he's been put out of his house, worried about corona virus.

## 2019-12-05 NOTE — ED Provider Notes (Signed)
[facility] EMERGENCY DEPARTMENT Provider Note   CSN: [id number] Arrival date & time: 12/05/19  1014     History Chief Complaint  Patient presents with  . Multiple Complaints    Clinton Johnson is a 49 y.o. male past medical history of hypertension, MI with cardiac stent, presenting to the emergency department with complaint of 3 days of shortness of breath, lower extremity edema, right upper quadrant abdominal pain.  Patient states 3 days ago he noticed bilateral ankles were swollen, this is improved with elevation.  He does endorse increased sodium intake in his diet.  He is taking his blood pressures at the pharmacy and he has had high readings.  He is not been taking any blood pressure medications.  He states last year he was seen at a [facility] in [facility] for a heart attack where he had a stent placed.  He was treated with aspirin and a "blood thinner" at discharge, however has not been taking it since.  No known history of CHF.  Mild cough is noted. Denies associated chest pain or fevers.  Patient also complaining of intermittent right upper quadrant abdominal pain that is postprandial in nature.  No associated nausea or vomiting.  Past abdominal surgeries include repair of his bowels when he was a teenager after an injury during football.  He does have an umbilical hernia though has not been bothering him.  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  Currently he is just driving through town.  The history is provided by the patient.       Past Medical History:  Diagnosis Date  . Hypertension     Patient Active Problem List   Diagnosis Date Noted  . Acute exacerbation of CHF (congestive heart failure) ([place]) 12/05/2019    History reviewed. No pertinent surgical history.     History reviewed. No pertinent family history.  Social History   Tobacco Use  . Smoking status: Current Every Day Smoker     Packs/day: 0.50    Types: Cigarettes  . Smokeless tobacco: Never Used  Substance Use Topics  . Alcohol use: Yes    Comment: weekly  . Drug use: No    Home Medications Prior to Admission medications   Medication Sig Start Date End Date Taking? Authorizing Provider  diphenhydrAMINE (BENADRYL) 25 MG tablet Take 1 tablet (25 mg total) by mouth every 6 (six) hours. Patient not taking: Reported on 12/09/2015 06/06/15   Jola Schmidt, MD  fluticasone [facility]) 50 MCG/ACT nasal spray Place 2 sprays into both nostrils daily. Patient not taking: Reported on 12/09/2015 06/06/15   Jola Schmidt, MD  hydrochlorothiazide (HYDRODIURIL) 25 MG tablet Take 1 tablet (25 mg total) by mouth daily. Patient not taking: Reported on 12/09/2015 06/06/15   Jola Schmidt, MD  omeprazole (PRILOSEC) 20 MG capsule Take 1 capsule (20 mg total) by mouth daily. 12/09/15   Palumbo, April, MD    Allergies    Patient has no known allergies.  Review of Systems   Review of Systems  All other systems reviewed and are negative.   Physical Exam Updated Vital Signs BP (!) 183/139   Pulse 93   Temp 98.4 F (36.9 C)   Resp 18   SpO2 100%   Physical Exam Vitals and nursing note reviewed.  Constitutional:      General: He is not in acute distress.    Appearance: He is well-developed. He is obese.  HENT:  Head: Normocephalic and atraumatic.  Eyes:     Conjunctiva/sclera: Conjunctivae normal.  Cardiovascular:     Rate and Rhythm: Normal rate and regular rhythm.  Pulmonary:     Effort: Pulmonary effort is normal. No respiratory distress.     Comments: Diminished bilaterally Abdominal:     General: Bowel sounds are normal.     Palpations: Abdomen is soft.     Tenderness: There is abdominal tenderness. There is no guarding or rebound.     Hernia: A hernia (Easily reducible umbilical hernia ) is present.     Comments: Right upper quadrant tenderness, neg murphys.  Midline surgical scar to the epigastrium.    Musculoskeletal:     Comments: 2-3+ pitting edema BLE  Skin:    General: Skin is warm.  Neurological:     Mental Status: He is alert.  Psychiatric:        Behavior: Behavior normal.     ED Results / Procedures / Treatments   Labs (all labs ordered are listed, but only abnormal results are displayed) Labs Reviewed  BRAIN NATRIURETIC PEPTIDE - Abnormal; Notable for the following components:      Result Value   B Natriuretic Peptide 633.8 (*)    All other components within normal limits  CBC WITH DIFFERENTIAL/PLATELET - Abnormal; Notable for the following components:   RDW 16.0 (*)    nRBC 0.3 (*)    All other components within normal limits  COMPREHENSIVE METABOLIC PANEL - Abnormal; Notable for the following components:   Creatinine, Ser 1.25 (*)    Calcium 8.7 (*)    All other components within normal limits  RESPIRATORY PANEL BY RT PCR (FLU A&B, COVID)  LIPASE, BLOOD    EKG EKG Interpretation  Date/Time:  Monday December 05 2019 11:55:50 EDT Ventricular Rate:  90 PR Interval:    QRS Duration: 100 QT Interval:  414 QTC Calculation: 507 R Axis:   12 Text Interpretation: Sinus rhythm Probable left atrial enlargement Anterior infarct, old Borderline repolarization abnormality Prolonged QT interval No significant change since last tracing Confirmed by Gareth Morgan [phone]) on 12/05/2019 2:39:11 PM   Radiology DG Chest Port 1 View  Result Date: 12/05/2019 CLINICAL DATA:  Shortness of breath EXAM: PORTABLE CHEST 1 VIEW COMPARISON:  None. FINDINGS: Bilateral interstitial thickening and hazy right lower lobe airspace disease concerning for pulmonary edema versus pneumonia. No pleural effusion or pneumothorax. Stable cardiomediastinal silhouette. No aggressive osseous lesion. IMPRESSION: Bilateral interstitial thickening and hazy right lower lobe airspace disease concerning for pulmonary edema versus pneumonia. Electronically Signed   By: Kathreen Devoid   On: 12/05/2019 13:34    US Abdomen Limited RUQ  Result Date: 12/05/2019 CLINICAL DATA:  Right upper quadrant pain EXAM: ULTRASOUND ABDOMEN LIMITED RIGHT UPPER QUADRANT COMPARISON:  None. FINDINGS: Gallbladder: Mildly thickened appearance of the gallbladder wall, most pronounced near the gallbladder neck measuring 5.6 mm. There are areas of comet tail artifact at this location (image 44) suggesting adenomyomatosis. No hyperdense gallstones. No sonographic Murphy sign noted by sonographer. Common bile duct: Diameter: 5 mm. Liver: Liver measures 19.6 cm in length. No focal lesion identified. Within normal limits in parenchymal echogenicity. Portal vein is patent on color Doppler imaging with normal direction of blood flow towards the liver. Other: Suspect small right pleural effusion. IMPRESSION: 1. No sonographic evidence of cholecystitis. 2. Findings suggestive of gallbladder adenomyomatosis. 3. Liver is mildly enlarged measuring approximately 20 cm in length. 4. Small right pleural effusion. Electronically Signed   By: Hart Carwin  Plundo D.O.   On: 12/05/2019 14:04    Procedures Procedures (including critical care time)  Medications Ordered in ED Medications  furosemide (LASIX) injection 40 mg (40 mg Intravenous Given 12/05/19 1528)  hydrALAZINE (APRESOLINE) injection 10 mg (10 mg Intravenous Given 12/05/19 1526)    ED Course  I have reviewed the triage vital signs and the nursing notes.  Pertinent labs & imaging results that were available during my care of the patient were reviewed by me and considered in my medical decision making (see chart for details).  Clinical Course as of Dec 04 1853  Molli Knock Dec 05, 2019  1620 Consulted with hospitalist, accepting admission   [JR]    Clinical Course User Index [JR] Keegan Bensch, [place] N, PA-C   MDM Rules/Calculators/A&P                          Patient with history of CAD, hypertension, presenting with a complaint of lower extremity edema, orthopnea, dyspnea on exertion  over the last few days.  He also has separate complaint of right upper quadrant abdominal pain that is postprandial nature, comes and goes.  On exam, patient is hypertensive, in no acute distress.  Lung sounds are diminished, he has 2-3+ pitting edema to bilateral lower extremities, O2 saturation is normal on room air.  Symptoms are concerning for new onset CHF, laboratory work-up initiated with basic labs, BNP, EKG and chest x-ray.  Regarding patient's right upper quadrant abdominal pain, he has some tenderness in this location, no palpable primary in the right upper quadrant, though patient does have easily reducible umbilical hernia.  He has surgical scar to the midline epigastric region.  Right upper quadrant ultrasound obtained to evaluate for gallbladder pathology.  Chest x-ray with findings consistent with pulmonary edema versus early infiltrate.  Given patient's presentation, favor pulmonary edema.  BNP is also elevated at 630.  Treated with IV Lasix and hydralazine.  Right upper quadrant ultrasound consistent with gallbladder adenomyomatosis.  No surrounding inflammatory changes concerning for acute cholecystitis.  Lipase within normal limits.  No leukocytosis.  Given patient's lack of outpatient follow-up, and presentation consistent with new onset of CHF, recommend admission for IV diuresis, blood pressure management, echocardiogram.  Patient accepted to [facility] hospitalist service for admission.  Patient however requested he self swab the nasopharyngeal swab for Covid test.  Recommended against this due to lack of efficacy of the swab.  Patient stating he would prefer to drive himself to [facility] for admission, however explained to patient that it was not lack of transport to the other facility but that patient does not have a bed available currently. lPatient then stating he would like to leave to get food. Offered multiple options for a meal including food delivery. Patient ultimately decided  to leave AMA. Patient is aware of the risks of leaving and verbalized understanding.  Final Clinical Impression(s) / ED Diagnoses Final diagnoses:  RUQ abdominal pain  New onset of congestive heart failure ([place])  Adenomyomatosis of gallbladder    Rx / DC Orders ED Discharge Orders    None       Kariann Wecker, [place] N, PA-C 12/05/19 1904    Gareth Morgan, MD 12/05/19 2156

## 2019-12-05 NOTE — ED Notes (Signed)
Pt refused Covid swab.

## 2019-12-05 NOTE — ED Notes (Signed)
Pt refused Covid swab states he will go to [facility] and they  Will let him swab himself , pt has on his shirt and  Has pulled off all  Leads they are laying on the floor pulled off bp cuff and pulse ox

## 2019-12-06 ENCOUNTER — Telehealth: Payer: Self-pay | Admitting: Emergency Medicine

## 2020-01-21 ENCOUNTER — Encounter (HOSPITAL_BASED_OUTPATIENT_CLINIC_OR_DEPARTMENT_OTHER): Payer: Self-pay

## 2020-01-21 ENCOUNTER — Other Ambulatory Visit: Payer: Self-pay

## 2020-01-21 ENCOUNTER — Emergency Department (HOSPITAL_BASED_OUTPATIENT_CLINIC_OR_DEPARTMENT_OTHER)
Admission: EM | Admit: 2020-01-21 | Discharge: 2020-01-21 | Disposition: A | Discharge status: Home / Self Care | Payer: Self-pay | Attending: Emergency Medicine | Admitting: Emergency Medicine

## 2020-01-21 ENCOUNTER — Emergency Department (HOSPITAL_BASED_OUTPATIENT_CLINIC_OR_DEPARTMENT_OTHER): Payer: Self-pay

## 2020-01-21 DIAGNOSIS — I509 Heart failure, unspecified: Secondary | ICD-10-CM

## 2020-01-21 DIAGNOSIS — F17210 Nicotine dependence, cigarettes, uncomplicated: Secondary | ICD-10-CM | POA: Insufficient documentation

## 2020-01-21 DIAGNOSIS — I110 Hypertensive heart disease with heart failure: Secondary | ICD-10-CM | POA: Insufficient documentation

## 2020-01-21 DIAGNOSIS — I5023 Acute on chronic systolic (congestive) heart failure: Secondary | ICD-10-CM | POA: Insufficient documentation

## 2020-01-21 LAB — CBC WITH DIFFERENTIAL/PLATELET
Abs Immature Granulocytes: 0.01 10*3/uL (ref 0.00–0.07)
Basophils Absolute: 0.1 10*3/uL (ref 0.0–0.1)
Basophils Relative: 1 %
Eosinophils Absolute: 0.1 10*3/uL (ref 0.0–0.5)
Eosinophils Relative: 2 %
HCT: 48.8 % (ref 39.0–52.0)
Hemoglobin: 15.4 g/dL (ref 13.0–17.0)
Immature Granulocytes: 0 %
Lymphocytes Relative: 51 %
Lymphs Abs: 2.9 10*3/uL (ref 0.7–4.0)
MCH: 27.1 pg (ref 26.0–34.0)
MCHC: 31.6 g/dL (ref 30.0–36.0)
MCV: 85.8 fL (ref 80.0–100.0)
Monocytes Absolute: 0.7 10*3/uL (ref 0.1–1.0)
Monocytes Relative: 12 %
Neutro Abs: 2 10*3/uL (ref 1.7–7.7)
Neutrophils Relative %: 34 %
Platelets: 274 10*3/uL (ref 150–400)
RBC: 5.69 MIL/uL (ref 4.22–5.81)
RDW: 16 % — ABNORMAL HIGH (ref 11.5–15.5)
WBC: 5.8 10*3/uL (ref 4.0–10.5)
nRBC: 0 % (ref 0.0–0.2)

## 2020-01-21 LAB — BASIC METABOLIC PANEL
Anion gap: 10 (ref 5–15)
BUN: 14 mg/dL (ref 6–20)
CO2: 25 mmol/L (ref 22–32)
Calcium: 9 mg/dL (ref 8.9–10.3)
Chloride: 104 mmol/L (ref 98–111)
Creatinine, Ser: 1.29 mg/dL — ABNORMAL HIGH (ref 0.61–1.24)
GFR, Estimated: >60 mL/min (ref >60)
Glucose, Bld: 105 mg/dL — ABNORMAL HIGH (ref 70–99)
Potassium: 3.6 mmol/L (ref 3.5–5.1)
Sodium: 139 mmol/L (ref 135–145)

## 2020-01-21 LAB — BRAIN NATRIURETIC PEPTIDE: B Natriuretic Peptide: 760.4 pg/mL — ABNORMAL HIGH (ref 0.0–100.0)

## 2020-01-21 MED ORDER — LABETALOL HCL 5 MG/ML IV SOLN
10.0000 mg | Freq: Once | INTRAVENOUS | Status: AC
  Administered 2020-01-21: 10 mg via INTRAVENOUS
  Filled 2020-01-21: qty 4

## 2020-01-21 MED ORDER — HYDROCHLOROTHIAZIDE 25 MG PO TABS: 25.0000 mg | ORAL_TABLET | Freq: Every day | ORAL | 0 refills | Status: DC

## 2020-01-21 MED ORDER — FUROSEMIDE 10 MG/ML IJ SOLN
40.0000 mg | Freq: Once | INTRAMUSCULAR | Status: AC
  Administered 2020-01-21: 40 mg via INTRAVENOUS
  Filled 2020-01-21: qty 4

## 2020-01-21 NOTE — ED Provider Notes (Signed)
[facility] EMERGENCY DEPARTMENT Provider Note   CSN: [id number] Arrival date & time: 01/21/20  1439     History Chief Complaint  Patient presents with  . Shortness of Breath    Chistian Johnson is a 49 y.o. male who presents with concern for shortness of breath, lower extremity edema intermittently times approximately 1 month.  Patient is a Administrator, originally from [place], who was seen in the emergency department in October 2021 when he is experiencing the same symptoms.  He states that at that time he was told he had fluid in his lungs, and they recommend admitting to the hospital but he declined.   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  He states that he rarely has any swelling when he is holding off of work, elevating his legs.  Patient states that he has since moved to [facility]  Personally reviewed this patient's medical records.  Patient with history of MI with stent placement in November 2020, hypertension, but states he does not take his medications.  Patient was seen here in December 05, 2019 and diagnosed with heart failure exacerbation.  ED provider that time wanted to admit the patient to the hospital due to evidence of pulmonary edema on his chest x-ray, as well as elevated BNP.  Patient left AMA at that time, refusing to be admitted to the hospital.  He was administered IV Lasix and hydralazine while in the emergency department at that time, and states that these medications made him feel much better.  He is hoping that we can do the same thing today and sent him home.  Patient has not seen a cardiologist ever, does not have a primary care doctor, is noncompliant with his hypertension medications.  HPI     Past Medical History:  Diagnosis Date  . Hypertension     Patient Active Problem List   Diagnosis Date Noted  . Acute exacerbation of CHF (congestive heart  failure) ([place]) 12/05/2019    History reviewed. No pertinent surgical history.     History reviewed. No pertinent family history.  Social History   Tobacco Use  . Smoking status: Current Every Day Smoker    Packs/day: 0.50    Types: Cigarettes  . Smokeless tobacco: Never Used  Substance Use Topics  . Alcohol use: Not Currently  . Drug use: No    Home Medications Prior to Admission medications   Medication Sig Start Date End Date Taking? Authorizing Provider  diphenhydrAMINE (BENADRYL) 25 MG tablet Take 1 tablet (25 mg total) by mouth every 6 (six) hours. Patient not taking: Reported on 12/09/2015 06/06/15   Jola Schmidt, MD  fluticasone [facility]) 50 MCG/ACT nasal spray Place 2 sprays into both nostrils daily. Patient not taking: Reported on 12/09/2015 06/06/15   Jola Schmidt, MD  hydrochlorothiazide (HYDRODIURIL) 25 MG tablet Take 1 tablet (25 mg total) by mouth daily for 14 days. 01/21/20 02/04/20  Kathyjo Briere, Gypsy Balsam, PA-C  omeprazole (PRILOSEC) 20 MG capsule Take 1 capsule (20 mg total) by mouth daily. 12/09/15   Palumbo, April, MD    Allergies    Patient has no known allergies.  Review of Systems   Review of Systems  Constitutional: Positive for fatigue.  HENT: Negative.   Eyes: Negative.   Respiratory: Positive for chest tightness and shortness of breath. Negative for cough.   Cardiovascular: Positive for leg swelling. Negative for chest pain and  palpitations.  Gastrointestinal: Negative for abdominal pain, diarrhea, nausea and vomiting.  Genitourinary: Negative for dysuria, hematuria and urgency.  Musculoskeletal: Negative for arthralgias, back pain, gait problem, joint swelling and myalgias.  Skin: Negative.   Neurological: Negative for dizziness, syncope, facial asymmetry, speech difficulty, weakness, light-headedness and headaches.  Psychiatric/Behavioral: Negative.     Physical Exam Updated Vital Signs BP (!) 166/100   Pulse 88   Temp 98.4 F (36.9 C)    Resp 20   Ht 6\' 1"  (1.854 m)   Wt 122.5 kg   SpO2 100%   BMI 35.62 kg/m   Physical Exam Vitals and nursing note reviewed.  Constitutional:      Appearance: He is obese.  HENT:     Head: Normocephalic and atraumatic.     Nose: Nose normal.     Mouth/Throat:     Mouth: Mucous membranes are moist.     Pharynx: Oropharynx is clear. Uvula midline. No oropharyngeal exudate, posterior oropharyngeal erythema or uvula swelling.     Tonsils: No tonsillar exudate.  Eyes:     General:        Right eye: No discharge.        Left eye: No discharge.     Conjunctiva/sclera: Conjunctivae normal.     Pupils: Pupils are equal, round, and reactive to light.  Neck:     Trachea: Trachea and phonation normal.  Cardiovascular:     Rate and Rhythm: Regular rhythm. Tachycardia present.     Pulses:          Radial pulses are 2+ on the right side and 2+ on the left side.       Dorsalis pedis pulses are 2+ on the right side and 1+ on the left side.     Heart sounds: No murmur heard.   Pulmonary:     Effort: Pulmonary effort is normal. No respiratory distress.     Breath sounds: Examination of the right-middle field reveals rales. Examination of the right-lower field reveals rales. Examination of the left-lower field reveals rales. Rales present. No wheezing.  Chest:     Chest wall: No mass, deformity, tenderness, crepitus or edema.  Abdominal:     General: Bowel sounds are normal. There is no distension.     Tenderness: There is no abdominal tenderness.  Musculoskeletal:        General: No deformity.     Cervical back: Neck supple. No edema, rigidity or crepitus. No pain with movement, spinous process tenderness or muscular tenderness.     Right lower leg: 2+ Pitting Edema present.     Left lower leg: 1+ Pitting Edema present.  Lymphadenopathy:     Cervical: No cervical adenopathy.  Skin:    General: Skin is warm and dry.     Capillary Refill: Capillary refill takes less than 2 seconds.    Neurological:     General: No focal deficit present.     Mental Status: He is alert and oriented to person, place, and time. Mental status is at baseline.  Psychiatric:        Mood and Affect: Mood normal.     ED Results / Procedures / Treatments   Labs (all labs ordered are listed, but only abnormal results are displayed) Labs Reviewed  BASIC METABOLIC PANEL - Abnormal; Notable for the following components:      Result Value   Glucose, Bld 105 (*)    Creatinine, Ser 1.29 (*)    All other components within  normal limits  CBC WITH DIFFERENTIAL/PLATELET - Abnormal; Notable for the following components:   RDW 16.0 (*)    All other components within normal limits  BRAIN NATRIURETIC PEPTIDE - Abnormal; Notable for the following components:   B Natriuretic Peptide 760.4 (*)    All other components within normal limits    EKG EKG Interpretation  Date/Time:  Saturday January 21 2020 17:11:04 EST Ventricular Rate:  103 PR Interval:    QRS Duration: 100 QT Interval:  386 QTC Calculation: 506 R Axis:   6 Text Interpretation: Sinus tachycardia Ventricular premature complex Probable left atrial enlargement LVH with secondary repolarization abnormality Anterior infarct, old Prolonged QT interval Confirmed by Quintella Reichert [phone]) on 01/21/2020 5:14:20 PM   Radiology DG Chest 2 View  Result Date: 01/21/2020 CLINICAL DATA:  Shortness of breath and bilateral ankle swelling. EXAM: CHEST - 2 VIEW COMPARISON:  December 05, 2019 FINDINGS: Mild, diffusely increased interstitial lung markings are seen. This is stable in severity when compared to the prior study. There is no evidence of a pleural effusion or pneumothorax. The cardiac silhouette is moderately enlarged and unchanged in size. The visualized skeletal structures are unremarkable. IMPRESSION: Findings consistent with mild interstitial edema. Electronically Signed   By: Virgina Norfolk M.D.   On: 01/21/2020 18:55     Procedures Procedures (including critical care time)  Medications Ordered in ED Medications  furosemide (LASIX) injection 40 mg (40 mg Intravenous Given 01/21/20 1944)  labetalol (NORMODYNE) injection 10 mg (10 mg Intravenous Given 01/21/20 2009)    ED Course  I have reviewed the triage vital signs and the nursing notes.  Pertinent labs & imaging results that were available during my care of the patient were reviewed by me and considered in my medical decision making (see chart for details).    MDM Rules/Calculators/A&P                         49 year old male with history of CAD, hypertension, CHF who presents today with intermittent shortness of breath, lower extremity swelling for the last month.   Patient hypertensive on intake 174/98.  Patient remains normal.  Physical exam significant for rales in the lung bases bilaterally, 2+ pitting edema in lower extremities bilaterally, R>L. Patient appears to be short of breath while sitting in the exam room.   Proceed with CBC, BMP, BNP, chest x-ray, EKG.  EKG with sinus tachy, prolonged Qtc 506.  Chest x-ray mild interstitial edema.  Patient continues to be hypertensive, IV labetalol ordered.  IV Lasix ordered for interstitial edema.  CBC unremarkable, BMP creatinine elevated to 1.29, previously 1.25.  BNP elevated to 760.4  Ambulatory O2 sat >96%.  Given interstitial edema on chest x-ray, shortness of breath at rest, lower extremity edema, and hypertension I recommend admission to the hospital for this patient.  I discussed my disposition plan with the patient, who adamantly refuses admission to the hospital at this time.  He states he can come back tomorrow, he cannot be admitted to the hospital today because his sister's funeral is tomorrow.   I had extensive discussion with this patient regarding risks of leaving the emergency department [facility]. He voiced understanding of the risks of progressively  worsening congestive heart failure and fluid overload, as well as a cardiac implications of uncontrolled hypertension.  He continued to express wishes to be discharged home at this time.  He voiced understanding that he is leaving [facility].  I prescribed 2-week supply of antihypertensive medication HCTZ, which the patient was on in 2017 before he stopped taking all his medications at home.  I provided contact information for the cardiologist, recommend patient follow-up closely with them.  Erskin voiced understanding of his medical evaluation and treatment plan.  He again voiced understanding he is leaving the emergency [place].   Final Clinical Impression(s) / ED Diagnoses Final diagnoses:  Acute on chronic congestive heart failure, unspecified heart failure type [facility])    Rx / DC Orders ED Discharge Orders         Ordered    hydrochlorothiazide (HYDRODIURIL) 25 MG tablet  Daily        01/21/20 2019           Athel Merriweather, Sharlene Dory 01/21/20 2045    Quintella Reichert, MD 01/21/20 2159

## 2020-01-21 NOTE — ED Notes (Signed)
Patient transported to X-ray 

## 2020-01-21 NOTE — ED Notes (Signed)
Pt ambulated in the room with O2 sats of 96%the whole time.

## 2020-01-21 NOTE — Discharge Instructions (Signed)
You were evaluated in the emergency department today for your shortness of breath and leg swelling.  Your blood pressure was very high while in the emergency department.  Based on your blood work and your chest x-ray we determined that you are in congestive heart failure. This is where your heart is not working properly and can get fluid backed up into your lungs and your limbs.  You were administered a fluid medication called Lasix that will help you drain extra fluid in your body, as well as a blood pressure medication called labetalol through your IV while in the emergency department.  It was my recommendation that you be admitted to the hospital tonight, however you refused admission tonight.  This means that you are leaving [facility], and you are assuming the risks associated with leaving the hospital in your current condition.  Below is the contact information for the cardiologist.  Please call their office first thing Monday morning to schedule an appointment for evaluation for high blood pressure and heart failure.  Additionally I prescribed you 2 weeks worth of the blood pressure medication you have been on in the past.  You will need to see a cardiologist for a refill.  Please return immediately to the emergency department if develop chest pain, worsening shortness of breath, palpitations, lightheadedness, dizziness, or other new severe symptoms.

## 2020-01-21 NOTE — ED Notes (Signed)
Pt not in lobby when called, was phoned, supposed to be in car.

## 2020-01-21 NOTE — ED Notes (Addendum)
Pts o2 sat 88% after walking in from outside to pt room. Pt recovered to 98% after sitting down

## 2020-01-21 NOTE — ED Triage Notes (Signed)
Pt states he was seen a few months ago for swelling in legs and shortness of breath. 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 Pt reports that when he is home and not driving his breathing and legs are fine.

## 2020-03-02 ENCOUNTER — Other Ambulatory Visit: Payer: Self-pay

## 2020-03-02 ENCOUNTER — Encounter (HOSPITAL_BASED_OUTPATIENT_CLINIC_OR_DEPARTMENT_OTHER): Payer: Self-pay

## 2020-03-02 DIAGNOSIS — F17210 Nicotine dependence, cigarettes, uncomplicated: Secondary | ICD-10-CM | POA: Insufficient documentation

## 2020-03-02 DIAGNOSIS — I509 Heart failure, unspecified: Secondary | ICD-10-CM | POA: Insufficient documentation

## 2020-03-02 DIAGNOSIS — Z760 Encounter for issue of repeat prescription: Secondary | ICD-10-CM | POA: Insufficient documentation

## 2020-03-02 DIAGNOSIS — Z79899 Other long term (current) drug therapy: Secondary | ICD-10-CM | POA: Insufficient documentation

## 2020-03-02 DIAGNOSIS — I110 Hypertensive heart disease with heart failure: Secondary | ICD-10-CM | POA: Insufficient documentation

## 2020-03-02 NOTE — ED Triage Notes (Signed)
Pt c/o ShOB and fluid build up in his legs and abd. Pt has hx of CHF. Pt states he is out of his medication. Does not have a PCP.  Pt's SpO2 with ambulation was 96% on R/A.

## 2020-03-03 ENCOUNTER — Emergency Department (HOSPITAL_BASED_OUTPATIENT_CLINIC_OR_DEPARTMENT_OTHER)
Admission: EM | Admit: 2020-03-03 | Discharge: 2020-03-03 | Disposition: A | Discharge status: Home / Self Care | Payer: Self-pay | Attending: Emergency Medicine | Admitting: Emergency Medicine

## 2020-03-03 DIAGNOSIS — I509 Heart failure, unspecified: Secondary | ICD-10-CM

## 2020-03-03 DIAGNOSIS — R609 Edema, unspecified: Secondary | ICD-10-CM

## 2020-03-03 DIAGNOSIS — Z760 Encounter for issue of repeat prescription: Secondary | ICD-10-CM

## 2020-03-03 HISTORY — DX: Heart failure, unspecified: I50.9

## 2020-03-03 LAB — BASIC METABOLIC PANEL
Anion gap: 8 (ref 5–15)
BUN: 11 mg/dL (ref 6–20)
CO2: 26 mmol/L (ref 22–32)
Calcium: 8.7 mg/dL — ABNORMAL LOW (ref 8.9–10.3)
Chloride: 107 mmol/L (ref 98–111)
Creatinine, Ser: 1.33 mg/dL — ABNORMAL HIGH (ref 0.61–1.24)
GFR, Estimated: >60 mL/min (ref >60)
Glucose, Bld: 104 mg/dL — ABNORMAL HIGH (ref 70–99)
Potassium: 3.4 mmol/L — ABNORMAL LOW (ref 3.5–5.1)
Sodium: 141 mmol/L (ref 135–145)

## 2020-03-03 LAB — CBC WITH DIFFERENTIAL/PLATELET
Abs Immature Granulocytes: 0.02 10*3/uL (ref 0.00–0.07)
Basophils Absolute: 0.1 10*3/uL (ref 0.0–0.1)
Basophils Relative: 1 %
Eosinophils Absolute: 0.1 10*3/uL (ref 0.0–0.5)
Eosinophils Relative: 2 %
HCT: 45.8 % (ref 39.0–52.0)
Hemoglobin: 14.7 g/dL (ref 13.0–17.0)
Immature Granulocytes: 0 %
Lymphocytes Relative: 50 %
Lymphs Abs: 2.3 10*3/uL (ref 0.7–4.0)
MCH: 27.1 pg (ref 26.0–34.0)
MCHC: 32.1 g/dL (ref 30.0–36.0)
MCV: 84.5 fL (ref 80.0–100.0)
Monocytes Absolute: 0.6 10*3/uL (ref 0.1–1.0)
Monocytes Relative: 12 %
Neutro Abs: 1.7 10*3/uL (ref 1.7–7.7)
Neutrophils Relative %: 35 %
Platelets: 217 10*3/uL (ref 150–400)
RBC: 5.42 MIL/uL (ref 4.22–5.81)
RDW: 17.7 % — ABNORMAL HIGH (ref 11.5–15.5)
Smear Review: NORMAL
WBC Morphology: ABNORMAL
WBC: 4.7 10*3/uL (ref 4.0–10.5)
nRBC: 0.4 % — ABNORMAL HIGH (ref 0.0–0.2)

## 2020-03-03 MED ORDER — HYDROCHLOROTHIAZIDE 25 MG PO TABS
25.0000 mg | ORAL_TABLET | Freq: Every day | ORAL | 2 refills | Status: DC
Start: 2020-03-03 — End: 2020-05-15

## 2020-03-03 MED ORDER — HYDROCHLOROTHIAZIDE 25 MG PO TABS
25.0000 mg | ORAL_TABLET | Freq: Once | ORAL | Status: AC
  Administered 2020-03-03: 25 mg via ORAL
  Filled 2020-03-03: qty 1

## 2020-03-03 MED ORDER — FUROSEMIDE 10 MG/ML IJ SOLN
20.0000 mg | Freq: Once | INTRAMUSCULAR | Status: AC
  Administered 2020-03-03: 20 mg via INTRAVENOUS
  Filled 2020-03-03: qty 2

## 2020-03-03 NOTE — ED Provider Notes (Signed)
[facility] Provider Note: Lowella Dell, MD, FACEP  CSN: [id number] MRN: [id number] ARRIVAL: 03/02/20 at 2318 ROOM: MH01/MH01   CHIEF COMPLAINT  Shortness of Breath   HISTORY OF PRESENT ILLNESS  03/03/20 12:22 AM Clinton Johnson is a 50 y.o. male with history of hypertension and congestive heart failure.  He does not currently have a physician and recently ran out of his hydrochlorothiazide.  Since then he has developed edema of his legs, abdomen and dyspnea on exertion.  The edema is moderate the dyspnea exertion is mild.  He has not had any shortness of breath.  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  His edema improves when he is at home and more active.   Past Medical History:  Diagnosis Date  . CHF (congestive heart failure) (HCC)   . Hypertension     Past Surgical History:  Procedure Laterality Date  . ABDOMINAL SURGERY      No family history on file.  Social History   Tobacco Use  . Smoking status: Current Every Day Smoker    Packs/day: 0.50    Types: Cigarettes  . Smokeless tobacco: Never Used  Vaping Use  . Vaping Use: Never used  Substance Use Topics  . Alcohol use: Yes  . Drug use: No    Prior to Admission medications   Medication Sig Start Date End Date Taking? Authorizing Provider  hydrochlorothiazide (HYDRODIURIL) 25 MG tablet Take 1 tablet (25 mg total) by mouth daily. 03/03/20 04/02/20  Naviah Belfield, MD  omeprazole (PRILOSEC) 20 MG capsule Take 1 capsule (20 mg total) by mouth daily. 12/09/15   Palumbo, April, MD  diphenhydrAMINE (BENADRYL) 25 MG tablet Take 1 tablet (25 mg total) by mouth every 6 (six) hours. Patient not taking: Reported on 12/09/2015 06/06/15 03/03/20  Azalia Bilis, MD  fluticasone [facility]) 50 MCG/ACT nasal spray Place 2 sprays into both nostrils daily. Patient not taking: Reported on 12/09/2015 06/06/15 03/03/20  Azalia Bilis, MD    Allergies Patient has no known allergies.   REVIEW OF  SYSTEMS  Negative except as noted here or in the History of Present Illness.   PHYSICAL EXAMINATION  Initial Vital Signs Blood pressure (!) 165/135, pulse 95, temperature 99 F (37.2 C), temperature source Oral, resp. rate 20, height 6\' 1"  (1.854 m), weight 128.6 kg, SpO2 98 %.  Examination General: Well-developed, well-nourished male in no acute distress; appearance consistent with age of record HENT: normocephalic; atraumatic; no pharyngeal erythema or exudate Eyes: pupils equal, round and reactive to light; extraocular muscles intact Neck: supple Heart: regular rate and rhythm Lungs: clear to auscultation bilaterally Abdomen: soft; nondistended; nontender; bowel sounds present Extremities: No deformity; full range of motion; +1 pitting edema of lower legs Neurologic: Awake, alert and oriented; motor function intact in all extremities and symmetric; no facial droop Skin: Warm and dry Psychiatric: Normal mood and affect   RESULTS  Summary of this visit's results, reviewed and interpreted by myself:   EKG Interpretation  Date/Time:  Friday March 02 2020 23:31:40 EST Ventricular Rate:  96 PR Interval:  148 QRS Duration: 92 QT Interval:  404 QTC Calculation: 510 R Axis:   55 Text Interpretation: Normal sinus rhythm Possible Left atrial enlargement Anteroseptal infarct , age undetermined Prolonged QT Abnormal ECG No significant change was found Confirmed by [id number] (Paula Libra) on 03/02/2020 11:41:28 PM      Laboratory Studies: Results for orders placed or performed during the hospital encounter of  03/03/20 (from the past 24 hour(s))  Basic metabolic panel     Status: Abnormal   Collection Time: 03/03/20 12:42 AM  Result Value Ref Range   Sodium 141 135 - 145 mmol/L   Potassium 3.4 (L) 3.5 - 5.1 mmol/L   Chloride 107 98 - 111 mmol/L   CO2 26 22 - 32 mmol/L   Glucose, Bld 104 (H) 70 - 99 mg/dL   BUN 11 6 - 20 mg/dL   Creatinine, Ser 1.33 (H) 0.61 - 1.24 mg/dL    Calcium 8.7 (L) 8.9 - 10.3 mg/dL   GFR, Estimated >60 >60 mL/min   Anion gap 8 5 - 15  CBC with Differential/Platelet     Status: Abnormal   Collection Time: 03/03/20 12:42 AM  Result Value Ref Range   WBC 4.7 4.0 - 10.5 K/uL   RBC 5.42 4.22 - 5.81 MIL/uL   Hemoglobin 14.7 13.0 - 17.0 g/dL   HCT 45.8 39.0 - 52.0 %   MCV 84.5 80.0 - 100.0 fL   MCH 27.1 26.0 - 34.0 pg   MCHC 32.1 30.0 - 36.0 g/dL   RDW 17.7 (H) 11.5 - 15.5 %   Platelets 217 150 - 400 K/uL   nRBC 0.4 (H) 0.0 - 0.2 %   Neutrophils Relative % 35 %   Neutro Abs 1.7 1.7 - 7.7 K/uL   Lymphocytes Relative 50 %   Lymphs Abs 2.3 0.7 - 4.0 K/uL   Monocytes Relative 12 %   Monocytes Absolute 0.6 0.1 - 1.0 K/uL   Eosinophils Relative 2 %   Eosinophils Absolute 0.1 0.0 - 0.5 K/uL   Basophils Relative 1 %   Basophils Absolute 0.1 0.0 - 0.1 K/uL   WBC Morphology Abnormal lymphocytes present    RBC Morphology MORPHOLOGY UNREMARKABLE    Smear Review Normal platelet morphology    Immature Granulocytes 0 %   Abs Immature Granulocytes 0.02 0.00 - 0.07 K/uL   Imaging Studies: No results found.  ED COURSE and MDM  Nursing notes, initial and subsequent vitals signs, including pulse oximetry, reviewed and interpreted by myself.  Vitals:   03/02/20 2324 03/02/20 2328 03/03/20 0100  BP:  (!) 165/135 (!) 167/91  Pulse:  95 91  Resp:  20 18  Temp:  99 F (37.2 C) 99.1 F (37.3 C)  TempSrc:  Oral Oral  SpO2:  98% 98%  Weight: 128.6 kg    Height: 6\' 1"  (1.854 m)     Medications  furosemide (LASIX) injection 20 mg (20 mg Intravenous Given 03/03/20 0044)  hydrochlorothiazide (HYDRODIURIL) tablet 25 mg (25 mg Oral Given 03/03/20 0044)   1:51 AM Brisk diuresis after IV Lasix.  We will refill the patient's hydrochlorothiazide.  He was strongly encouraged to establish with a new PCP.   PROCEDURES  Procedures   ED DIAGNOSES     ICD-10-CM   1. Acute on chronic congestive heart failure, unspecified heart failure type ([place])   I50.9   2. Medication refill  Z76.0   3. Peripheral edema  R60.9        Louetta Hollingshead, Jenny Reichmann, MD 03/03/20 [phone]

## 2020-03-05 ENCOUNTER — Other Ambulatory Visit: Payer: Self-pay

## 2020-03-05 ENCOUNTER — Encounter (HOSPITAL_BASED_OUTPATIENT_CLINIC_OR_DEPARTMENT_OTHER): Payer: Self-pay

## 2020-03-05 DIAGNOSIS — Z79899 Other long term (current) drug therapy: Secondary | ICD-10-CM | POA: Insufficient documentation

## 2020-03-05 DIAGNOSIS — F17210 Nicotine dependence, cigarettes, uncomplicated: Secondary | ICD-10-CM | POA: Insufficient documentation

## 2020-03-05 DIAGNOSIS — I509 Heart failure, unspecified: Secondary | ICD-10-CM | POA: Insufficient documentation

## 2020-03-05 DIAGNOSIS — R0789 Other chest pain: Secondary | ICD-10-CM | POA: Insufficient documentation

## 2020-03-05 DIAGNOSIS — R1013 Epigastric pain: Secondary | ICD-10-CM | POA: Insufficient documentation

## 2020-03-05 DIAGNOSIS — I110 Hypertensive heart disease with heart failure: Secondary | ICD-10-CM | POA: Insufficient documentation

## 2020-03-05 DIAGNOSIS — R6881 Early satiety: Secondary | ICD-10-CM | POA: Insufficient documentation

## 2020-03-05 NOTE — ED Triage Notes (Signed)
Pt presents from home with abdominal pain. Reports that after drinking 1 cup or water or eating two cookies he is so "full" that he can't catch his breath or take a deep breath.

## 2020-03-06 ENCOUNTER — Emergency Department (HOSPITAL_BASED_OUTPATIENT_CLINIC_OR_DEPARTMENT_OTHER): Payer: Self-pay

## 2020-03-06 ENCOUNTER — Emergency Department (HOSPITAL_BASED_OUTPATIENT_CLINIC_OR_DEPARTMENT_OTHER)
Admission: EM | Admit: 2020-03-06 | Discharge: 2020-03-06 | Disposition: A | Discharge status: Home / Self Care | Payer: Self-pay | Attending: Emergency Medicine | Admitting: Emergency Medicine

## 2020-03-06 DIAGNOSIS — R6881 Early satiety: Secondary | ICD-10-CM

## 2020-03-06 DIAGNOSIS — R1013 Epigastric pain: Secondary | ICD-10-CM

## 2020-03-06 LAB — CBC WITH DIFFERENTIAL/PLATELET
Abs Immature Granulocytes: 0.02 10*3/uL (ref 0.00–0.07)
Basophils Absolute: 0.1 10*3/uL (ref 0.0–0.1)
Basophils Relative: 1 %
Eosinophils Absolute: 0.1 10*3/uL (ref 0.0–0.5)
Eosinophils Relative: 1 %
HCT: 47.8 % (ref 39.0–52.0)
Hemoglobin: 15.4 g/dL (ref 13.0–17.0)
Immature Granulocytes: 0 %
Lymphocytes Relative: 32 %
Lymphs Abs: 2.6 10*3/uL (ref 0.7–4.0)
MCH: 27.1 pg (ref 26.0–34.0)
MCHC: 32.2 g/dL (ref 30.0–36.0)
MCV: 84.2 fL (ref 80.0–100.0)
Monocytes Absolute: 1 10*3/uL (ref 0.1–1.0)
Monocytes Relative: 12 %
Neutro Abs: 4.5 10*3/uL (ref 1.7–7.7)
Neutrophils Relative %: 54 %
Platelets: 223 10*3/uL (ref 150–400)
RBC: 5.68 MIL/uL (ref 4.22–5.81)
RDW: 17.6 % — ABNORMAL HIGH (ref 11.5–15.5)
WBC: 8.2 10*3/uL (ref 4.0–10.5)
nRBC: 0 % (ref 0.0–0.2)

## 2020-03-06 LAB — LIPASE, BLOOD: Lipase: 19 U/L (ref 11–51)

## 2020-03-06 LAB — COMPREHENSIVE METABOLIC PANEL
ALT: 17 U/L (ref 0–44)
AST: 14 U/L — ABNORMAL LOW (ref 15–41)
Albumin: 3.8 g/dL (ref 3.5–5.0)
Alkaline Phosphatase: 87 U/L (ref 38–126)
Anion gap: 12 (ref 5–15)
BUN: 13 mg/dL (ref 6–20)
CO2: 25 mmol/L (ref 22–32)
Calcium: 8.9 mg/dL (ref 8.9–10.3)
Chloride: 100 mmol/L (ref 98–111)
Creatinine, Ser: 1.31 mg/dL — ABNORMAL HIGH (ref 0.61–1.24)
GFR, Estimated: >60 mL/min (ref >60)
Glucose, Bld: 105 mg/dL — ABNORMAL HIGH (ref 70–99)
Potassium: 3.5 mmol/L (ref 3.5–5.1)
Sodium: 137 mmol/L (ref 135–145)
Total Bilirubin: 1.3 mg/dL — ABNORMAL HIGH (ref 0.3–1.2)
Total Protein: 7.9 g/dL (ref 6.5–8.1)

## 2020-03-06 MED ORDER — IOHEXOL 300 MG/ML  SOLN
100.0000 mL | Freq: Once | INTRAMUSCULAR | Status: AC | PRN
  Administered 2020-03-06: 100 mL via INTRAVENOUS

## 2020-03-06 MED ORDER — SODIUM CHLORIDE 0.9 % IV BOLUS
1000.0000 mL | Freq: Once | INTRAVENOUS | Status: AC
  Administered 2020-03-06: 1000 mL via INTRAVENOUS

## 2020-03-06 MED ORDER — FUROSEMIDE 20 MG PO TABS: 20.0000 mg | ORAL_TABLET | Freq: Every day | ORAL | 0 refills | Status: DC

## 2020-03-06 NOTE — ED Provider Notes (Signed)
[facility] EMERGENCY DEPARTMENT Provider Note   CSN: [id number] Arrival date & time: 03/05/20  2332     History Chief Complaint  Patient presents with  . Abdominal Pain    Clinton Johnson is a 50 y.o. male.  Patient is a 50 year old male with history of CHF, hypertension, and prior abdominal surgery related to a traumatic football injury.  Patient presents today for evaluation of upper abdominal pain that has been occurring intermittently over the past week.  He describes feeling extremely full like "eating at a buffet a restaurant" after he eats just a small amount of food.  He denies any weight loss.  He denies any bowel or bladder complaints.  He was seen here with similar complaints 5 days ago.  He was given laxatives, however this has not helped.  He denies bloody stool.  He denies fevers or chills.  The history is provided by the patient.  Abdominal Pain Pain location:  Epigastric Pain quality: cramping   Pain radiates to:  Does not radiate Pain severity:  Moderate Duration:  5 days Timing:  Intermittent Progression:  Worsening Chronicity:  New Relieved by:  Nothing Worsened by:  Eating      Past Medical History:  Diagnosis Date  . CHF (congestive heart failure) (HCC)   . Hypertension     Patient Active Problem List   Diagnosis Date Noted  . Acute exacerbation of CHF (congestive heart failure) (HCC) 12/05/2019    Past Surgical History:  Procedure Laterality Date  . ABDOMINAL SURGERY         History reviewed. No pertinent family history.  Social History   Tobacco Use  . Smoking status: Current Every Day Smoker    Packs/day: 0.50    Types: Cigarettes  . Smokeless tobacco: Never Used  Vaping Use  . Vaping Use: Never used  Substance Use Topics  . Alcohol use: Yes  . Drug use: No    Home Medications Prior to Admission medications   Medication Sig Start Date End Date Taking? Authorizing Provider  hydrochlorothiazide (HYDRODIURIL) 25 MG  tablet Take 1 tablet (25 mg total) by mouth daily. 03/03/20 04/02/20  Molpus, John, MD  omeprazole (PRILOSEC) 20 MG capsule Take 1 capsule (20 mg total) by mouth daily. 12/09/15   Palumbo, April, MD  diphenhydrAMINE (BENADRYL) 25 MG tablet Take 1 tablet (25 mg total) by mouth every 6 (six) hours. Patient not taking: Reported on 12/09/2015 06/06/15 03/03/20  Azalia Bilis, MD  fluticasone [facility]) 50 MCG/ACT nasal spray Place 2 sprays into both nostrils daily. Patient not taking: Reported on 12/09/2015 06/06/15 03/03/20  Azalia Bilis, MD    Allergies    Patient has no known allergies.  Review of Systems   Review of Systems  Gastrointestinal: Positive for abdominal pain.  All other systems reviewed and are negative.   Physical Exam Updated Vital Signs BP (!) 175/95 (BP Location: Left Arm)   Pulse 98   Temp 98.6 F (37 C) (Oral)   Resp 18   Ht 6\' 1"  (1.854 m)   Wt 128.6 kg   SpO2 100%   BMI 37.40 kg/m   Physical Exam Vitals and nursing note reviewed.  Constitutional:      General: He is not in acute distress.    Appearance: He is well-developed and well-nourished. He is not diaphoretic.  HENT:     Head: Normocephalic and atraumatic.     Mouth/Throat:     Mouth: Oropharynx is clear and moist.  Cardiovascular:  Rate and Rhythm: Normal rate and regular rhythm.     Heart sounds: No murmur heard. No friction rub.  Pulmonary:     Effort: Pulmonary effort is normal. No respiratory distress.     Breath sounds: Normal breath sounds. No wheezing or rales.  Abdominal:     General: Bowel sounds are normal. There is no distension.     Palpations: Abdomen is soft.     Tenderness: There is abdominal tenderness in the epigastric area. There is no guarding or rebound.     Comments: There is mild tenderness in the epigastric region.  There is a prior surgical scar in this area, but appears to be normal in appearance.  He has an umbilical hernia that is reducible and nontender.  Musculoskeletal:         General: No edema. Normal range of motion.     Cervical back: Normal range of motion and neck supple.  Skin:    General: Skin is warm and dry.  Neurological:     Mental Status: He is alert and oriented to person, place, and time.     Coordination: Coordination normal.     ED Results / Procedures / Treatments   Labs (all labs ordered are listed, but only abnormal results are displayed) Labs Reviewed  COMPREHENSIVE METABOLIC PANEL  CBC WITH DIFFERENTIAL/PLATELET  LIPASE, BLOOD  URINALYSIS, ROUTINE W REFLEX MICROSCOPIC    EKG None  Radiology No results found.  Procedures Procedures (including critical care time)  Medications Ordered in ED Medications  sodium chloride 0.9 % bolus 1,000 mL (has no administration in time range)    ED Course  I have reviewed the triage vital signs and the nursing notes.  Pertinent labs & imaging results that were available during my care of the patient were reviewed by me and considered in my medical decision making (see chart for details).    MDM Rules/Calculators/A&P  Patient is a 50 year old male presenting with complaints of early satiety.  He tells me he eats 2 cookies and feels as though he is eating a full meal.  He is not describing any pain or fever.  Patient's work-up reveals unremarkable laboratory studies.  CT of his abdomen and chest shows trace pulmonary edema and small bilateral pleural effusions, but no acute intra-abdominal pathology.  I am uncertain as to how the trace pulmonary edema relates to his eating issues, but I suspect are unrelated.  Patient tells me he did have a MI in the past and apparently was diagnosed with heart failure.  He had been on Lasix, however has been off of this for many months.  I will restart his Lasix and place a referral for gastroenterology.  Patient may benefit from an endoscopy to evaluate for gastritis or other abnormality that would cause him to feel full.  Final Clinical  Impression(s) / ED Diagnoses Final diagnoses:  None    Rx / DC Orders ED Discharge Orders    None       Veryl Speak, MD 03/06/20 [phone]

## 2020-03-06 NOTE — Discharge Instructions (Signed)
Resume taking your Lasix as previously prescribed.  This medication has been refilled for you today.  Follow-up with gastroenterology in the next week for a recheck if symptoms or not improving.  The contact information for [facility] gastroenterology has been provided in this discharge summary for you to call and make these arrangements.

## 2020-05-07 ENCOUNTER — Emergency Department (HOSPITAL_BASED_OUTPATIENT_CLINIC_OR_DEPARTMENT_OTHER)
Admission: EM | Admit: 2020-05-07 | Discharge: 2020-05-07 | Disposition: A | Discharge status: Home / Self Care | Payer: Self-pay | Attending: Emergency Medicine | Admitting: Emergency Medicine

## 2020-05-07 ENCOUNTER — Other Ambulatory Visit: Payer: Self-pay

## 2020-05-07 ENCOUNTER — Encounter (HOSPITAL_BASED_OUTPATIENT_CLINIC_OR_DEPARTMENT_OTHER): Payer: Self-pay | Admitting: *Deleted

## 2020-05-07 ENCOUNTER — Emergency Department (HOSPITAL_BASED_OUTPATIENT_CLINIC_OR_DEPARTMENT_OTHER): Payer: Self-pay

## 2020-05-07 DIAGNOSIS — I509 Heart failure, unspecified: Secondary | ICD-10-CM | POA: Insufficient documentation

## 2020-05-07 DIAGNOSIS — I110 Hypertensive heart disease with heart failure: Secondary | ICD-10-CM | POA: Insufficient documentation

## 2020-05-07 DIAGNOSIS — F17210 Nicotine dependence, cigarettes, uncomplicated: Secondary | ICD-10-CM | POA: Insufficient documentation

## 2020-05-07 LAB — BASIC METABOLIC PANEL
Anion gap: 7 (ref 5–15)
BUN: 11 mg/dL (ref 6–20)
CO2: 27 mmol/L (ref 22–32)
Calcium: 8.5 mg/dL — ABNORMAL LOW (ref 8.9–10.3)
Chloride: 105 mmol/L (ref 98–111)
Creatinine, Ser: 1.32 mg/dL — ABNORMAL HIGH (ref 0.61–1.24)
GFR, Estimated: >60 mL/min (ref >60)
Glucose, Bld: 118 mg/dL — ABNORMAL HIGH (ref 70–99)
Potassium: 3.2 mmol/L — ABNORMAL LOW (ref 3.5–5.1)
Sodium: 139 mmol/L (ref 135–145)

## 2020-05-07 LAB — CBC
HCT: 41.8 % (ref 39.0–52.0)
Hemoglobin: 13.7 g/dL (ref 13.0–17.0)
MCH: 27.6 pg (ref 26.0–34.0)
MCHC: 32.8 g/dL (ref 30.0–36.0)
MCV: 84.3 fL (ref 80.0–100.0)
Platelets: 234 10*3/uL (ref 150–400)
RBC: 4.96 MIL/uL (ref 4.22–5.81)
RDW: 18.9 % — ABNORMAL HIGH (ref 11.5–15.5)
WBC: 5.8 10*3/uL (ref 4.0–10.5)
nRBC: 0.3 % — ABNORMAL HIGH (ref 0.0–0.2)

## 2020-05-07 LAB — TROPONIN I (HIGH SENSITIVITY): Troponin I (High Sensitivity): 24 ng/L — ABNORMAL HIGH (ref <18)

## 2020-05-07 LAB — BRAIN NATRIURETIC PEPTIDE: B Natriuretic Peptide: 760 pg/mL — ABNORMAL HIGH (ref 0.0–100.0)

## 2020-05-07 MED ORDER — FUROSEMIDE 20 MG PO TABS: 20.0000 mg | ORAL_TABLET | Freq: Every day | ORAL | 0 refills | Status: DC

## 2020-05-07 MED ORDER — POTASSIUM CHLORIDE ER 10 MEQ PO TBCR: 10.0000 meq | EXTENDED_RELEASE_TABLET | Freq: Every day | ORAL | 0 refills | Status: DC

## 2020-05-07 MED ORDER — ALBUTEROL SULFATE HFA 108 (90 BASE) MCG/ACT IN AERS: 2.0000 | INHALATION_SPRAY | RESPIRATORY_TRACT | Status: DC | PRN

## 2020-05-07 MED ORDER — POTASSIUM CHLORIDE CRYS ER 20 MEQ PO TBCR
40.0000 meq | EXTENDED_RELEASE_TABLET | Freq: Once | ORAL | Status: AC
  Administered 2020-05-07: 40 meq via ORAL
  Filled 2020-05-07: qty 2

## 2020-05-07 MED ORDER — FUROSEMIDE 10 MG/ML IJ SOLN
40.0000 mg | Freq: Once | INTRAMUSCULAR | Status: AC
  Administered 2020-05-07: 40 mg via INTRAVENOUS
  Filled 2020-05-07: qty 4

## 2020-05-07 NOTE — Progress Notes (Signed)
Patient is not wheezing at this time and his BBS are clear.  Patient states that he has swelling in his legs but is not in distress.  RT will continue to monitor.

## 2020-05-07 NOTE — ED Provider Notes (Signed)
[facility] Provider Note   CSN: [id number] Arrival date & time: 05/07/20  2028     History Chief Complaint  Patient presents with  . Shortness of Breath    Clinton Johnson is a 50 y.o. male.  The history is provided by the patient and medical records.   Clinton Johnson is a 50 y.o. male who presents to the Emergency Department complaining of swelling, sob.  He presents to the ED complaining of three days of progressive lower extremity edema and sob.  He ran out of his meds a few days ago.  No chest pain, fever.   Works as a Administrator.  Gets fatigued easily over the last few days.  Has a cough, productive of mucous.   He works as a Administrator. He does not have a cardiologist. He states that two years ago he was admitted to a hospital in [facility] and had a stent placed in his heart. He gets his medications from the emergency department. He has not been vaccinated for COVID-19. No known sick contacts.    Past Medical History:  Diagnosis Date  . CHF (congestive heart failure) ([place])   . Hypertension     Patient Active Problem List   Diagnosis Date Noted  . Acute exacerbation of CHF (congestive heart failure) ([place]) 12/05/2019    Past Surgical History:  Procedure Laterality Date  . ABDOMINAL SURGERY         No family history on file.  Social History   Tobacco Use  . Smoking status: Current Every Day Smoker    Packs/day: 0.50    Types: Cigarettes  . Smokeless tobacco: Never Used  Vaping Use  . Vaping Use: Never used  Substance Use Topics  . Alcohol use: Yes  . Drug use: No    Home Medications Prior to Admission medications   Medication Sig Start Date End Date Taking? Authorizing Provider  furosemide (LASIX) 20 MG tablet Take 1 tablet (20 mg total) by mouth daily. 05/07/20  Yes Quintella Reichert, MD  omeprazole (PRILOSEC) 20 MG capsule Take 1 capsule (20 mg total) by mouth daily. 12/09/15  Yes Palumbo, April, MD  potassium chloride  (KLOR-CON) 10 MEQ tablet Take 1 tablet (10 mEq total) by mouth daily. 05/07/20  Yes Quintella Reichert, MD  hydrochlorothiazide (HYDRODIURIL) 25 MG tablet Take 1 tablet (25 mg total) by mouth daily. 03/03/20 04/02/20  Molpus, Jenny Reichmann, MD  diphenhydrAMINE (BENADRYL) 25 MG tablet Take 1 tablet (25 mg total) by mouth every 6 (six) hours. Patient not taking: Reported on 12/09/2015 06/06/15 03/03/20  Jola Schmidt, MD  fluticasone [facility]) 50 MCG/ACT nasal spray Place 2 sprays into both nostrils daily. Patient not taking: Reported on 12/09/2015 06/06/15 03/03/20  Jola Schmidt, MD    Allergies    Patient has no known allergies.  Review of Systems   Review of Systems  All other systems reviewed and are negative.   Physical Exam Updated Vital Signs BP (!) 142/101   Pulse 89   Temp 99 F (37.2 C) (Oral)   Resp (!) 33   Ht 6\' 1"  (1.854 m)   Wt 129.7 kg   SpO2 91%   BMI 37.73 kg/m   Physical Exam Vitals and nursing note reviewed.  Constitutional:      Appearance: He is well-developed and well-nourished.  HENT:     Head: Normocephalic and atraumatic.  Cardiovascular:     Rate and Rhythm: Normal rate and regular rhythm.  Heart sounds: No murmur heard.   Pulmonary:     Effort: Pulmonary effort is normal. No respiratory distress.     Comments: Crackles to the mid lung bilaterally Abdominal:     Palpations: Abdomen is soft.     Tenderness: There is no abdominal tenderness. There is no guarding or rebound.  Musculoskeletal:        General: No tenderness or edema.     Comments: 2+ pitting edema to bilateral lower extremities  Skin:    General: Skin is warm and dry.  Neurological:     Mental Status: He is alert and oriented to person, place, and time.  Psychiatric:        Mood and Affect: Mood and affect normal.        Behavior: Behavior normal.     ED Results / Procedures / Treatments   Labs (all labs ordered are listed, but only abnormal results are displayed) Labs Reviewed  BASIC  METABOLIC PANEL - Abnormal; Notable for the following components:      Result Value   Potassium 3.2 (*)    Glucose, Bld 118 (*)    Creatinine, Ser 1.32 (*)    Calcium 8.5 (*)    All other components within normal limits  CBC - Abnormal; Notable for the following components:   RDW 18.9 (*)    nRBC 0.3 (*)    All other components within normal limits  BRAIN NATRIURETIC PEPTIDE - Abnormal; Notable for the following components:   B Natriuretic Peptide 760.0 (*)    All other components within normal limits  TROPONIN I (HIGH SENSITIVITY) - Abnormal; Notable for the following components:   Troponin I (High Sensitivity) 24 (*)    All other components within normal limits  TROPONIN I (HIGH SENSITIVITY)    EKG EKG Interpretation  Date/Time:  Monday May 07 2020 20:54:55 EST Ventricular Rate:  99 PR Interval:    QRS Duration: 109 QT Interval:  403 QTC Calculation: 518 R Axis:   58 Text Interpretation: Sinus rhythm Probable left atrial enlargement LVH with secondary repolarization abnormality Prolonged QT interval Confirmed by Quintella Reichert [phone]) on 05/07/2020 9:35:01 PM   Radiology DG Chest 2 View  Result Date: 05/07/2020 CLINICAL DATA:  Shortness of breath since yesterday, lower extremity edema EXAM: CHEST - 2 VIEW COMPARISON:  01/21/2020 FINDINGS: Frontal and lateral views of the chest demonstrate an enlarged cardiac silhouette. There is increased central vascular congestion, with diffuse increased interstitial prominence and perihilar ground-glass airspace disease. Trace bilateral pleural effusions. No pneumothorax. IMPRESSION: 1. Constellation of findings most consistent with mild congestive heart failure. Electronically Signed   By: Randa Ngo M.D.   On: 05/07/2020 21:17    Procedures Procedures   Medications Ordered in ED Medications  potassium chloride SA (KLOR-CON) CR tablet 40 mEq (has no administration in time range)  furosemide (LASIX) injection 40 mg (40 mg  Intravenous Given 05/07/20 2238)    ED Course  I have reviewed the triage vital signs and the nursing notes.  Pertinent labs & imaging results that were available during my care of the patient were reviewed by me and considered in my medical decision making (see chart for details).    MDM Rules/Calculators/A&P                         patient here for evaluation of progressive lower extremity edema and shortness of breath. He is non-toxic appearing on evaluation. He does have diffuse  crackles in lower extremity edema on examination. During his ED stay he has experienced multiple episodes of hypoxia with oxygen saturations dropping to the 70s. Chest x-ray with pulmonary vascular congestion. Labs significant for elevation in his BNP, mild elevation in troponin. Discussed with patient concern for hypoxia in setting of CHF and recommend admission for ongoing treatment. Also recommend further evaluation with testing for COVID-19 and he declines at this time. Patient refuses admission to a local hospital. Forde Dandy was held when patient was on supplemental oxygen and not hypoxic. Discussed concern for worsening symptoms, heart and brain damage due to hypoxia and patient technologist these risks. Discussed with patient that he is free to return to the emergency department at anytime for reassessment. Final Clinical Impression(s) / ED Diagnoses Final diagnoses:  Acute congestive heart failure, unspecified heart failure type ([place])    Rx / DC Orders ED Discharge Orders         Ordered    furosemide (LASIX) 20 MG tablet  Daily        05/07/20 2256    potassium chloride (KLOR-CON) 10 MEQ tablet  Daily        05/07/20 2256           Quintella Reichert, MD 05/07/20 2259

## 2020-05-07 NOTE — ED Triage Notes (Addendum)
Sob since yesterday. States he had a negative Covid test a couple of weeks ago. Oxygen sats 95%r/a. 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 Symptoms started after he ran out of his medications to tx his CHF.

## 2020-05-07 NOTE — Discharge Instructions (Addendum)
You have congestive heart failure and fluid on your lungs. Your oxygen levels keep dropping in the emergency department. It is recommended that she get admitted to the hospital for further treatment and workup.

## 2020-05-15 ENCOUNTER — Other Ambulatory Visit: Payer: Self-pay

## 2020-05-15 ENCOUNTER — Encounter (HOSPITAL_BASED_OUTPATIENT_CLINIC_OR_DEPARTMENT_OTHER): Payer: Self-pay | Admitting: Emergency Medicine

## 2020-05-15 ENCOUNTER — Emergency Department (HOSPITAL_BASED_OUTPATIENT_CLINIC_OR_DEPARTMENT_OTHER)
Admission: EM | Admit: 2020-05-15 | Discharge: 2020-05-15 | Disposition: A | Discharge status: Home / Self Care | Payer: Self-pay | Attending: Emergency Medicine | Admitting: Emergency Medicine

## 2020-05-15 DIAGNOSIS — Z79899 Other long term (current) drug therapy: Secondary | ICD-10-CM | POA: Insufficient documentation

## 2020-05-15 DIAGNOSIS — F17210 Nicotine dependence, cigarettes, uncomplicated: Secondary | ICD-10-CM | POA: Insufficient documentation

## 2020-05-15 DIAGNOSIS — I110 Hypertensive heart disease with heart failure: Secondary | ICD-10-CM | POA: Insufficient documentation

## 2020-05-15 DIAGNOSIS — I509 Heart failure, unspecified: Secondary | ICD-10-CM | POA: Insufficient documentation

## 2020-05-15 MED ORDER — FUROSEMIDE 40 MG PO TABS
40.0000 mg | ORAL_TABLET | Freq: Once | ORAL | Status: DC
  Filled 2020-05-15: qty 1

## 2020-05-15 MED ORDER — FUROSEMIDE 20 MG PO TABS: 20.0000 mg | ORAL_TABLET | Freq: Once | ORAL | Status: DC

## 2020-05-15 MED ORDER — HYDROCHLOROTHIAZIDE 25 MG PO TABS: 25.0000 mg | ORAL_TABLET | Freq: Every day | ORAL | 2 refills | Status: DC

## 2020-05-15 MED ORDER — FUROSEMIDE 10 MG/ML IJ SOLN
40.0000 mg | Freq: Once | INTRAMUSCULAR | Status: AC
  Administered 2020-05-15: 40 mg via INTRAVENOUS
  Filled 2020-05-15: qty 4

## 2020-05-15 MED ORDER — FUROSEMIDE 20 MG PO TABS: 20.0000 mg | ORAL_TABLET | Freq: Every day | ORAL | 0 refills | Status: DC

## 2020-05-15 NOTE — ED Triage Notes (Addendum)
Pt c/o shob, hypertension and CHF. Pt was seen here 3/7 for same and has ran out of meds x 1 month due to being unable to get in with PMD for 3 months.

## 2020-05-15 NOTE — ED Provider Notes (Addendum)
[facility] EMERGENCY DEPARTMENT Provider Note   CSN: [id number] Arrival date & time: 05/15/20  1941     History Chief Complaint  Patient presents with  . Shortness of Breath    Clinton Johnson is a 50 y.o. male.  Patient here for medication refill.  States that he ran out of his heart failure medication and his blood pressure medicine.  He was seen here last week for similar but did not get prescriptions for his medications.  He does not feel short of breath or chest pain.  He has noticed that he has started to get some swelling in his legs that happens when he does not have his medications.  States that he just wants his medications refilled.  Denies any active chest pain or shortness of breath.  The history is provided by the patient.  Shortness of Breath Severity:  Mild Onset quality:  Gradual Timing:  Intermittent Progression:  Waxing and waning Chronicity:  New Relieved by:  Nothing Worsened by:  Nothing Associated symptoms: no abdominal pain, no chest pain, no claudication, no cough, no diaphoresis, no ear pain, no fever, no headaches, no hemoptysis, no neck pain, no rash, no sore throat, no sputum production, no syncope, no swollen glands and no vomiting        Past Medical History:  Diagnosis Date  . CHF (congestive heart failure) ([place])   . Hypertension     Patient Active Problem List   Diagnosis Date Noted  . Acute exacerbation of CHF (congestive heart failure) ([place]) 12/05/2019    Past Surgical History:  Procedure Laterality Date  . ABDOMINAL SURGERY         No family history on file.  Social History   Tobacco Use  . Smoking status: Current Every Day Smoker    Packs/day: 0.50    Types: Cigarettes  . Smokeless tobacco: Never Used  Vaping Use  . Vaping Use: Never used  Substance Use Topics  . Alcohol use: Yes  . Drug use: No    Home Medications Prior to Admission medications   Medication Sig Start Date End Date Taking? Authorizing  Provider  furosemide (LASIX) 20 MG tablet Take 1 tablet (20 mg total) by mouth daily. 05/15/20 06/14/20  Evelean Bigler, DO  hydrochlorothiazide (HYDRODIURIL) 25 MG tablet Take 1 tablet (25 mg total) by mouth daily. 05/15/20 06/14/20  Vermelle Cammarata, DO  omeprazole (PRILOSEC) 20 MG capsule Take 1 capsule (20 mg total) by mouth daily. 12/09/15   Palumbo, April, MD  potassium chloride (KLOR-CON) 10 MEQ tablet Take 1 tablet (10 mEq total) by mouth daily. 05/07/20   Quintella Reichert, MD  diphenhydrAMINE (BENADRYL) 25 MG tablet Take 1 tablet (25 mg total) by mouth every 6 (six) hours. Patient not taking: Reported on 12/09/2015 06/06/15 03/03/20  Jola Schmidt, MD  fluticasone [facility]) 50 MCG/ACT nasal spray Place 2 sprays into both nostrils daily. Patient not taking: Reported on 12/09/2015 06/06/15 03/03/20  Jola Schmidt, MD    Allergies    Patient has no known allergies.  Review of Systems   Review of Systems  Constitutional: Negative for chills, diaphoresis and fever.  HENT: Negative for ear pain and sore throat.   Eyes: Negative for pain and visual disturbance.  Respiratory: Positive for shortness of breath. Negative for cough, hemoptysis and sputum production.   Cardiovascular: Positive for leg swelling. Negative for chest pain, palpitations, claudication and syncope.  Gastrointestinal: Negative for abdominal pain and vomiting.  Genitourinary: Negative for dysuria and hematuria.  Musculoskeletal: Negative for arthralgias, back pain and neck pain.  Skin: Negative for color change and rash.  Neurological: Negative for seizures, syncope and headaches.  All other systems reviewed and are negative.   Physical Exam Updated Vital Signs  ED Triage Vitals  Enc Vitals Group     BP 05/15/20 1946 (!) 175/137     Pulse Rate 05/15/20 1946 (!) 106     Resp 05/15/20 1946 19     Temp 05/15/20 1946 97.8 F (36.6 C)     Temp Source 05/15/20 1946 Oral     SpO2 05/15/20 1946 98 %     Weight 05/15/20 1953 285 lb  15 oz (129.7 kg)     Height 05/15/20 1953 6\' 1"  (1.854 m)     Head Circumference --      Peak Flow --      Pain Score 05/15/20 1953 0     Pain Loc --      Pain Edu? --      Excl. in [place]? --     Physical Exam Vitals and nursing note reviewed.  Constitutional:      Appearance: He is well-developed.  HENT:     Head: Normocephalic and atraumatic.  Eyes:     Extraocular Movements: Extraocular movements intact.     Conjunctiva/sclera: Conjunctivae normal.     Pupils: Pupils are equal, round, and reactive to light.  Cardiovascular:     Rate and Rhythm: Normal rate and regular rhythm.     Pulses: Normal pulses.     Heart sounds: Normal heart sounds. No murmur heard.   Pulmonary:     Effort: Pulmonary effort is normal. No respiratory distress.     Breath sounds: Normal breath sounds. No decreased breath sounds or wheezing.  Abdominal:     Palpations: Abdomen is soft.     Tenderness: There is no abdominal tenderness.  Musculoskeletal:     Cervical back: Normal range of motion and neck supple.     Right lower leg: Edema (trace) present.     Left lower leg: Edema (trace) present.  Skin:    General: Skin is warm and dry.     Capillary Refill: Capillary refill takes less than 2 seconds.  Neurological:     General: No focal deficit present.     Mental Status: He is alert.     ED Results / Procedures / Treatments   Labs (all labs ordered are listed, but only abnormal results are displayed) Labs Reviewed - No data to display  EKG EKG Interpretation  Date/Time:  Tuesday May 15 2020 19:56:15 EDT Ventricular Rate:  104 PR Interval:    QRS Duration: 102 QT Interval:  374 QTC Calculation: 492 R Axis:   43 Text Interpretation: Sinus tachycardia Probable left atrial enlargement Anterior infarct, old Borderline repolarization abnormality Confirmed by Lennice Sites [phone]) on 05/15/2020 9:05:45 PM   Radiology No results found.  Procedures Procedures   Medications Ordered  in ED Medications  furosemide (LASIX) injection 40 mg (40 mg Intravenous Given 05/15/20 2030)    ED Course  I have reviewed the triage vital signs and the nursing notes.  Pertinent labs & imaging results that were available during my care of the patient were reviewed by me and considered in my medical decision making (see chart for details).    MDM Rules/Calculators/A&P  Rual Vermeer is a 50 year old male with history of hypertension, heart failure who presents the ED with medication refill.  He states he needs his medicine for blood pressure and heart failure.  He has been without his Lasix for several days.  He started to notice some swelling in his legs.  Does not feel short of breath or chest pain.  EKG shows sinus rhythm.  Had been seen last week and overall had some evidence of mild heart failure.  He states that he got IV Lasix and felt a lot better last week.  He does not appear to have any signs of respiratory failure.  Room air oxygenation is normal and ambulatory pulse ox is normal.  Offered him laboratory work and chest x-ray however he prefers to just have his medications refilled which I think is reasonable as patient overall appears stable.  He states that it might be 2 to 3 months until he can see a primary care doctor.  We will give him information to follow-up with cardiology to see if he can get a quicker an appointment.  Will prescribe him Lasix and blood pressure medicine.  He understands return precautions.  This chart was dictated using voice recognition software.  Despite best efforts to proofread,  errors can occur which can change the documentation meaning.    Final Clinical Impression(s) / ED Diagnoses Final diagnoses:  Congestive heart failure, unspecified HF chronicity, unspecified heart failure type [facility])    Rx / DC Orders ED Discharge Orders         Ordered    furosemide (LASIX) 20 MG tablet  Daily        05/15/20 2109     hydrochlorothiazide (HYDRODIURIL) 25 MG tablet  Daily        05/15/20 2109           Lennice Sites, DO 05/15/20 2110    Lennice Sites, DO 05/15/20 2110

## 2020-06-26 ENCOUNTER — Other Ambulatory Visit: Payer: Self-pay

## 2020-06-26 ENCOUNTER — Encounter (HOSPITAL_BASED_OUTPATIENT_CLINIC_OR_DEPARTMENT_OTHER): Payer: Self-pay | Admitting: Emergency Medicine

## 2020-06-26 ENCOUNTER — Emergency Department (HOSPITAL_BASED_OUTPATIENT_CLINIC_OR_DEPARTMENT_OTHER)
Admission: EM | Admit: 2020-06-26 | Discharge: 2020-06-26 | Disposition: A | Discharge status: Home / Self Care | Payer: Self-pay | Attending: Emergency Medicine | Admitting: Emergency Medicine

## 2020-06-26 DIAGNOSIS — F17210 Nicotine dependence, cigarettes, uncomplicated: Secondary | ICD-10-CM | POA: Insufficient documentation

## 2020-06-26 DIAGNOSIS — I10 Essential (primary) hypertension: Secondary | ICD-10-CM

## 2020-06-26 DIAGNOSIS — Z79899 Other long term (current) drug therapy: Secondary | ICD-10-CM | POA: Insufficient documentation

## 2020-06-26 DIAGNOSIS — I509 Heart failure, unspecified: Secondary | ICD-10-CM | POA: Insufficient documentation

## 2020-06-26 DIAGNOSIS — I110 Hypertensive heart disease with heart failure: Secondary | ICD-10-CM | POA: Insufficient documentation

## 2020-06-26 MED ORDER — HYDROCHLOROTHIAZIDE 25 MG PO TABS
25.0000 mg | ORAL_TABLET | Freq: Once | ORAL | Status: AC
  Administered 2020-06-26: 25 mg via ORAL
  Filled 2020-06-26: qty 1

## 2020-06-26 MED ORDER — HYDROCHLOROTHIAZIDE 25 MG PO TABS: 25.0000 mg | ORAL_TABLET | Freq: Every day | ORAL | 2 refills | Status: DC

## 2020-06-26 NOTE — ED Triage Notes (Signed)
Patient presents with complaints of hypertension; denies chest pain, headache, dizziness or blurred vision; states ran out of bp meds x 3 days; states follow up with pcp in June.

## 2020-06-26 NOTE — ED Provider Notes (Signed)
[facility] EMERGENCY DEPARTMENT Provider Note   CSN: [id number] Arrival date & time: 06/26/20  0308     History Chief Complaint  Patient presents with  . Hypertension    Clinton Johnson is a 50 y.o. male.  Patient presents to the emergency department with concerns over high blood pressure.  He has a history of high blood pressure, normally maintained on hydrochlorothiazide.  Patient reports that he ran out of his medications 3 days ago.  He has not had any headache, blurred vision, chest pain, shortness of breath or swelling.        Past Medical History:  Diagnosis Date  . CHF (congestive heart failure) ([place])   . Hypertension     Patient Active Problem List   Diagnosis Date Noted  . Acute exacerbation of CHF (congestive heart failure) ([place]) 12/05/2019    Past Surgical History:  Procedure Laterality Date  . ABDOMINAL SURGERY         History reviewed. No pertinent family history.  Social History   Tobacco Use  . Smoking status: Current Every Day Smoker    Packs/day: 0.50    Types: Cigarettes  . Smokeless tobacco: Never Used  Vaping Use  . Vaping Use: Never used  Substance Use Topics  . Alcohol use: Yes  . Drug use: No    Home Medications Prior to Admission medications   Medication Sig Start Date End Date Taking? Authorizing Provider  furosemide (LASIX) 20 MG tablet Take 1 tablet (20 mg total) by mouth daily. 05/15/20 06/14/20  Curatolo, Adam, DO  hydrochlorothiazide (HYDRODIURIL) 25 MG tablet Take 1 tablet (25 mg total) by mouth daily. 06/26/20 09/24/20  Orpah Greek, MD  omeprazole (PRILOSEC) 20 MG capsule Take 1 capsule (20 mg total) by mouth daily. 12/09/15   Palumbo, April, MD  potassium chloride (KLOR-CON) 10 MEQ tablet Take 1 tablet (10 mEq total) by mouth daily. 05/07/20   Quintella Reichert, MD  diphenhydrAMINE (BENADRYL) 25 MG tablet Take 1 tablet (25 mg total) by mouth every 6 (six) hours. Patient not taking: Reported on 12/09/2015  06/06/15 03/03/20  Jola Schmidt, MD  fluticasone [facility]) 50 MCG/ACT nasal spray Place 2 sprays into both nostrils daily. Patient not taking: Reported on 12/09/2015 06/06/15 03/03/20  Jola Schmidt, MD    Allergies    Patient has no known allergies.  Review of Systems   Review of Systems  Respiratory: Negative for shortness of breath.   Cardiovascular: Positive for chest pain.  All other systems reviewed and are negative.   Physical Exam Updated Vital Signs BP (!) 157/113 (BP Location: Right Arm)   Pulse (!) 106   Temp 98.7 F (37.1 C) (Oral)   Resp 18   Ht 6\' 1"  (1.854 m)   Wt 129.7 kg   SpO2 97%   BMI 37.72 kg/m   Physical Exam Vitals and nursing note reviewed.  Constitutional:      General: He is not in acute distress.    Appearance: Normal appearance. He is well-developed.  HENT:     Head: Normocephalic and atraumatic.     Right Ear: Hearing normal.     Left Ear: Hearing normal.     Nose: Nose normal.  Eyes:     Conjunctiva/sclera: Conjunctivae normal.     Pupils: Pupils are equal, round, and reactive to light.  Cardiovascular:     Rate and Rhythm: Regular rhythm.     Heart sounds: S1 normal and S2 normal. No murmur heard. No friction rub.  No gallop.   Pulmonary:     Effort: Pulmonary effort is normal. No respiratory distress.     Breath sounds: Normal breath sounds.  Chest:     Chest wall: No tenderness.  Abdominal:     General: Bowel sounds are normal.     Palpations: Abdomen is soft.     Tenderness: There is no abdominal tenderness. There is no guarding or rebound. Negative signs include Murphy's sign and McBurney's sign.     Hernia: No hernia is present.  Musculoskeletal:        General: Normal range of motion.     Cervical back: Normal range of motion and neck supple.  Skin:    General: Skin is warm and dry.     Findings: No rash.  Neurological:     Mental Status: He is alert and oriented to person, place, and time.     GCS: GCS eye subscore is 4. GCS  verbal subscore is 5. GCS motor subscore is 6.     Cranial Nerves: No cranial nerve deficit.     Sensory: No sensory deficit.     Coordination: Coordination normal.  Psychiatric:        Speech: Speech normal.        Behavior: Behavior normal.        Thought Content: Thought content normal.     ED Results / Procedures / Treatments   Labs (all labs ordered are listed, but only abnormal results are displayed) Labs Reviewed - No data to display  EKG None  Radiology No results found.  Procedures Procedures   Medications Ordered in ED Medications  hydrochlorothiazide (HYDRODIURIL) tablet 25 mg (has no administration in time range)    ED Course  I have reviewed the triage vital signs and the nursing notes.  Pertinent labs & imaging results that were available during my care of the patient were reviewed by me and considered in my medical decision making (see chart for details).    MDM Rules/Calculators/A&P                          Patient appears well and is symptom-free.  He has been off of his meds for 3 days and his blood pressure is elevated.  He has follow-up with cardiology scheduled in 2 months.  Previous cardiac history is unclear.  He thinks he had some kind of a stent placed in [facility] but he is not sure where it was placed.  He was told he did not have a heart attack.  He was told that his heart was pumping fine after that event, but he does carry history of congestive heart failure.  He does not appear decompensated at this time.  Will require echo when he follows up with cardiology to determine if his medications need to be adjusted.  Final Clinical Impression(s) / ED Diagnoses Final diagnoses:  Primary hypertension    Rx / DC Orders ED Discharge Orders         Ordered    hydrochlorothiazide (HYDRODIURIL) 25 MG tablet  Daily        06/26/20 0342           Orpah Greek, MD 06/26/20 [phone]

## 2021-01-26 ENCOUNTER — Emergency Department (HOSPITAL_BASED_OUTPATIENT_CLINIC_OR_DEPARTMENT_OTHER): Payer: Self-pay

## 2021-01-26 ENCOUNTER — Other Ambulatory Visit: Payer: Self-pay

## 2021-01-26 ENCOUNTER — Encounter (HOSPITAL_BASED_OUTPATIENT_CLINIC_OR_DEPARTMENT_OTHER): Payer: Self-pay | Admitting: Emergency Medicine

## 2021-01-26 ENCOUNTER — Inpatient Hospital Stay (HOSPITAL_BASED_OUTPATIENT_CLINIC_OR_DEPARTMENT_OTHER)
Admission: EM | Admit: 2021-01-26 | Discharge: 2021-01-29 | DRG: 281 | Disposition: A | Discharge status: Home / Self Care | Payer: Self-pay | Attending: Cardiovascular Disease | Admitting: Cardiovascular Disease

## 2021-01-26 DIAGNOSIS — I2510 Atherosclerotic heart disease of native coronary artery without angina pectoris: Secondary | ICD-10-CM | POA: Diagnosis present

## 2021-01-26 DIAGNOSIS — Z7982 Long term (current) use of aspirin: Secondary | ICD-10-CM

## 2021-01-26 DIAGNOSIS — Z20822 Contact with and (suspected) exposure to covid-19: Secondary | ICD-10-CM | POA: Diagnosis present

## 2021-01-26 DIAGNOSIS — Z8249 Family history of ischemic heart disease and other diseases of the circulatory system: Secondary | ICD-10-CM

## 2021-01-26 DIAGNOSIS — I10 Essential (primary) hypertension: Secondary | ICD-10-CM

## 2021-01-26 DIAGNOSIS — I5023 Acute on chronic systolic (congestive) heart failure: Secondary | ICD-10-CM

## 2021-01-26 DIAGNOSIS — I5020 Unspecified systolic (congestive) heart failure: Secondary | ICD-10-CM

## 2021-01-26 DIAGNOSIS — I214 Non-ST elevation (NSTEMI) myocardial infarction: Principal | ICD-10-CM | POA: Diagnosis present

## 2021-01-26 DIAGNOSIS — I110 Hypertensive heart disease with heart failure: Secondary | ICD-10-CM | POA: Diagnosis present

## 2021-01-26 DIAGNOSIS — I5022 Chronic systolic (congestive) heart failure: Secondary | ICD-10-CM | POA: Diagnosis present

## 2021-01-26 DIAGNOSIS — I5043 Acute on chronic combined systolic (congestive) and diastolic (congestive) heart failure: Secondary | ICD-10-CM

## 2021-01-26 DIAGNOSIS — E785 Hyperlipidemia, unspecified: Secondary | ICD-10-CM | POA: Diagnosis present

## 2021-01-26 DIAGNOSIS — Z720 Tobacco use: Secondary | ICD-10-CM

## 2021-01-26 DIAGNOSIS — Z955 Presence of coronary angioplasty implant and graft: Secondary | ICD-10-CM

## 2021-01-26 DIAGNOSIS — Z6837 Body mass index (BMI) 37.0-37.9, adult: Secondary | ICD-10-CM

## 2021-01-26 DIAGNOSIS — Z79899 Other long term (current) drug therapy: Secondary | ICD-10-CM

## 2021-01-26 DIAGNOSIS — E876 Hypokalemia: Secondary | ICD-10-CM | POA: Diagnosis not present

## 2021-01-26 DIAGNOSIS — F17210 Nicotine dependence, cigarettes, uncomplicated: Secondary | ICD-10-CM | POA: Diagnosis present

## 2021-01-26 DIAGNOSIS — I255 Ischemic cardiomyopathy: Secondary | ICD-10-CM | POA: Diagnosis present

## 2021-01-26 DIAGNOSIS — E663 Overweight: Secondary | ICD-10-CM | POA: Diagnosis present

## 2021-01-26 DIAGNOSIS — F1010 Alcohol abuse, uncomplicated: Secondary | ICD-10-CM | POA: Diagnosis present

## 2021-01-26 HISTORY — DX: Atherosclerotic heart disease of native coronary artery without angina pectoris: I25.10

## 2021-01-26 LAB — BASIC METABOLIC PANEL
Anion gap: 11 (ref 5–15)
Anion gap: 11 (ref 5–15)
BUN: 14 mg/dL (ref 6–20)
BUN: 12 mg/dL (ref 6–20)
CO2: 30 mmol/L (ref 22–32)
CO2: 30 mmol/L (ref 22–32)
Calcium: 9 mg/dL (ref 8.9–10.3)
Calcium: 9.5 mg/dL (ref 8.9–10.3)
Chloride: 94 mmol/L — ABNORMAL LOW (ref 98–111)
Chloride: 98 mmol/L (ref 98–111)
Creatinine, Ser: 1.34 mg/dL — ABNORMAL HIGH (ref 0.61–1.24)
Creatinine, Ser: 1.15 mg/dL (ref 0.61–1.24)
GFR, Estimated: >60 mL/min (ref >60)
GFR, Estimated: >60 mL/min (ref >60)
Glucose, Bld: 115 mg/dL — ABNORMAL HIGH (ref 70–99)
Glucose, Bld: 121 mg/dL — ABNORMAL HIGH (ref 70–99)
Potassium: 2.5 mmol/L — CL (ref 3.5–5.1)
Potassium: 3.3 mmol/L — ABNORMAL LOW (ref 3.5–5.1)
Sodium: 135 mmol/L (ref 135–145)
Sodium: 139 mmol/L (ref 135–145)

## 2021-01-26 LAB — HEPATIC FUNCTION PANEL
ALT: 15 U/L (ref 0–44)
AST: 30 U/L (ref 15–41)
Albumin: 4 g/dL (ref 3.5–5.0)
Alkaline Phosphatase: 76 U/L (ref 38–126)
Bilirubin, Direct: 0.1 mg/dL (ref 0.0–0.2)
Indirect Bilirubin: 0.8 mg/dL (ref 0.3–0.9)
Total Bilirubin: 0.9 mg/dL (ref 0.3–1.2)
Total Protein: 8.2 g/dL — ABNORMAL HIGH (ref 6.5–8.1)

## 2021-01-26 LAB — RESP PANEL BY RT-PCR (FLU A&B, COVID) ARPGX2
Influenza A by PCR: NEGATIVE
Influenza B by PCR: NEGATIVE
SARS Coronavirus 2 by RT PCR: NEGATIVE

## 2021-01-26 LAB — CBC
HCT: 42 % (ref 39.0–52.0)
HCT: 35.8 % — ABNORMAL LOW (ref 39.0–52.0)
Hemoglobin: 13.8 g/dL (ref 13.0–17.0)
Hemoglobin: 11.7 g/dL — ABNORMAL LOW (ref 13.0–17.0)
MCH: 28.2 pg (ref 26.0–34.0)
MCH: 28.1 pg (ref 26.0–34.0)
MCHC: 32.9 g/dL (ref 30.0–36.0)
MCHC: 32.7 g/dL (ref 30.0–36.0)
MCV: 85.9 fL (ref 80.0–100.0)
MCV: 86.1 fL (ref 80.0–100.0)
Platelets: 226 10*3/uL (ref 150–400)
Platelets: 200 10*3/uL (ref 150–400)
RBC: 4.89 MIL/uL (ref 4.22–5.81)
RBC: 4.16 MIL/uL — ABNORMAL LOW (ref 4.22–5.81)
RDW: 14.6 % (ref 11.5–15.5)
RDW: 14.8 % (ref 11.5–15.5)
WBC: 6.9 10*3/uL (ref 4.0–10.5)
WBC: 6.4 10*3/uL (ref 4.0–10.5)
nRBC: 0 % (ref 0.0–0.2)
nRBC: 0 % (ref 0.0–0.2)

## 2021-01-26 LAB — TROPONIN I (HIGH SENSITIVITY)
Troponin I (High Sensitivity): 1043 ng/L (ref <18)
Troponin I (High Sensitivity): 821 ng/L (ref <18)
Troponin I (High Sensitivity): 1642 ng/L (ref <18)

## 2021-01-26 LAB — MAGNESIUM: Magnesium: 2.1 mg/dL (ref 1.7–2.4)

## 2021-01-26 LAB — HEPARIN LEVEL (UNFRACTIONATED)
Heparin Unfractionated: 0.28 IU/mL — ABNORMAL LOW (ref 0.30–0.70)
Heparin Unfractionated: 0.35 IU/mL (ref 0.30–0.70)

## 2021-01-26 LAB — ETHANOL: Alcohol, Ethyl (B): 157 mg/dL — ABNORMAL HIGH (ref <10)

## 2021-01-26 LAB — TSH: TSH: 2.594 u[IU]/mL (ref 0.350–4.500)

## 2021-01-26 LAB — HIV ANTIBODY (ROUTINE TESTING W REFLEX): HIV Screen 4th Generation wRfx: NONREACTIVE

## 2021-01-26 MED ORDER — NITROGLYCERIN 2 % TD OINT
1.0000 [in_us] | TOPICAL_OINTMENT | Freq: Once | TRANSDERMAL | Status: AC
  Administered 2021-01-26: 1 [in_us] via TOPICAL
  Filled 2021-01-26: qty 1

## 2021-01-26 MED ORDER — ASPIRIN 81 MG PO CHEW: 324.0000 mg | CHEWABLE_TABLET | ORAL | Status: DC

## 2021-01-26 MED ORDER — NITROPRUSSIDE SODIUM-NACL 20-0.9 MG/100ML-% IV SOLN: 0.0000 ug/kg/min | INTRAVENOUS | Status: DC

## 2021-01-26 MED ORDER — METOPROLOL TARTRATE 25 MG PO TABS
25.0000 mg | ORAL_TABLET | Freq: Two times a day (BID) | ORAL | Status: DC
  Administered 2021-01-28 – 2021-01-29 (×2): 25 mg via ORAL
  Filled 2021-01-26 (×6): qty 1

## 2021-01-26 MED ORDER — SODIUM CHLORIDE 0.9 % IV SOLN: INTRAVENOUS | Status: DC

## 2021-01-26 MED ORDER — ONDANSETRON HCL 4 MG/2ML IJ SOLN: 4.0000 mg | Freq: Four times a day (QID) | INTRAMUSCULAR | Status: DC | PRN

## 2021-01-26 MED ORDER — ALPRAZOLAM 0.5 MG PO TABS: 0.2500 mg | ORAL_TABLET | Freq: Two times a day (BID) | ORAL | Status: DC | PRN

## 2021-01-26 MED ORDER — NITROGLYCERIN 2 % TD OINT
1.0000 [in_us] | TOPICAL_OINTMENT | Freq: Four times a day (QID) | TRANSDERMAL | Status: DC
  Administered 2021-01-26 – 2021-01-29 (×9): 1 [in_us] via TOPICAL
  Filled 2021-01-26 (×2): qty 30

## 2021-01-26 MED ORDER — ATORVASTATIN CALCIUM 80 MG PO TABS
80.0000 mg | ORAL_TABLET | Freq: Every day | ORAL | Status: DC
  Administered 2021-01-26 – 2021-01-29 (×4): 80 mg via ORAL
  Filled 2021-01-26 (×4): qty 1

## 2021-01-26 MED ORDER — SODIUM CHLORIDE 0.9% FLUSH
3.0000 mL | Freq: Two times a day (BID) | INTRAVENOUS | Status: DC
  Administered 2021-01-26 – 2021-01-29 (×5): 3 mL via INTRAVENOUS

## 2021-01-26 MED ORDER — ASPIRIN 81 MG PO CHEW
324.0000 mg | CHEWABLE_TABLET | Freq: Once | ORAL | Status: AC
  Administered 2021-01-26: 324 mg via ORAL
  Filled 2021-01-26: qty 4

## 2021-01-26 MED ORDER — KETOROLAC TROMETHAMINE 30 MG/ML IJ SOLN
30.0000 mg | Freq: Once | INTRAMUSCULAR | Status: AC
  Administered 2021-01-26: 30 mg via INTRAVENOUS
  Filled 2021-01-26: qty 1

## 2021-01-26 MED ORDER — HEPARIN (PORCINE) 25000 UT/250ML-% IV SOLN
2050.0000 [IU]/h | INTRAVENOUS | Status: DC
  Administered 2021-01-26: 03:00:00 1600 [IU]/h via INTRAVENOUS
  Administered 2021-01-27 (×2): 2050 [IU]/h via INTRAVENOUS
  Filled 2021-01-26 (×4): qty 250

## 2021-01-26 MED ORDER — NICOTINE 14 MG/24HR TD PT24
14.0000 mg | MEDICATED_PATCH | Freq: Every day | TRANSDERMAL | Status: DC
  Filled 2021-01-26: qty 1

## 2021-01-26 MED ORDER — POTASSIUM CHLORIDE 10 MEQ/100ML IV SOLN
10.0000 meq | Freq: Once | INTRAVENOUS | Status: AC
  Administered 2021-01-26: 10 meq via INTRAVENOUS
  Filled 2021-01-26: qty 100

## 2021-01-26 MED ORDER — NITROGLYCERIN 0.4 MG SL SUBL: 0.4000 mg | SUBLINGUAL_TABLET | SUBLINGUAL | Status: DC | PRN

## 2021-01-26 MED ORDER — HEPARIN BOLUS VIA INFUSION
1600.0000 [IU] | Freq: Once | INTRAVENOUS | Status: AC
Start: 2021-01-26 — End: 2021-01-26
  Administered 2021-01-26: 1600 [IU] via INTRAVENOUS
  Filled 2021-01-26: qty 1600

## 2021-01-26 MED ORDER — POTASSIUM CHLORIDE CRYS ER 20 MEQ PO TBCR
40.0000 meq | EXTENDED_RELEASE_TABLET | Freq: Once | ORAL | Status: AC
Start: 2021-01-26 — End: 2021-01-26
  Administered 2021-01-26: 40 meq via ORAL
  Filled 2021-01-26: qty 2

## 2021-01-26 MED ORDER — ASPIRIN EC 81 MG PO TBEC
81.0000 mg | DELAYED_RELEASE_TABLET | Freq: Every day | ORAL | Status: DC
  Administered 2021-01-27 – 2021-01-28 (×2): 81 mg via ORAL
  Filled 2021-01-26 (×2): qty 1

## 2021-01-26 MED ORDER — POTASSIUM CHLORIDE CRYS ER 20 MEQ PO TBCR
40.0000 meq | EXTENDED_RELEASE_TABLET | Freq: Once | ORAL | Status: AC
  Administered 2021-01-26: 40 meq via ORAL
  Filled 2021-01-26: qty 2

## 2021-01-26 MED ORDER — HEPARIN BOLUS VIA INFUSION
4000.0000 [IU] | Freq: Once | INTRAVENOUS | Status: AC
Start: 2021-01-26 — End: 2021-01-26
  Administered 2021-01-26: 4000 [IU] via INTRAVENOUS

## 2021-01-26 MED ORDER — ACETAMINOPHEN 325 MG PO TABS: 650.0000 mg | ORAL_TABLET | ORAL | Status: DC | PRN

## 2021-01-26 MED ORDER — ASPIRIN 300 MG RE SUPP: 300.0000 mg | RECTAL | Status: DC

## 2021-01-26 MED ORDER — MORPHINE SULFATE (PF) 4 MG/ML IV SOLN
4.0000 mg | Freq: Once | INTRAVENOUS | Status: AC
  Administered 2021-01-26: 4 mg via INTRAVENOUS
  Filled 2021-01-26: qty 1

## 2021-01-26 NOTE — Progress Notes (Signed)
ANTICOAGULATION CONSULT NOTE - Initial Consult  Pharmacy Consult for Heparin Indication: chest pain/ACS  No Known Allergies  Patient Measurements: Height: 6\' 1"  (185.4 cm) Weight: 129.3 kg (285 lb) IBW/kg (Calculated) : 79.9 Heparin Dosing Weight: 109 kg  Vital Signs: Temp: 98 F (36.7 C) (11/26 0112) Temp Source: Oral (11/26 0112) BP: 125/82 (11/26 0200) Pulse Rate: 91 (11/26 0200)  Labs: Recent Labs    01/26/21 0123 01/26/21 0124  HGB 13.8  --   HCT 42.0  --   PLT 226  --   CREATININE 1.34*  --   TROPONINIHS  --  1,043*    Estimated Creatinine Clearance: 90.9 mL/min (A) (by C-G formula based on SCr of 1.34 mg/dL (H)).   Medical History: Past Medical History:  Diagnosis Date   CHF (congestive heart failure) ([facility])    Hypertension     Medications:  Awaiting home med rec  Assessment: 50 y.o. M presents with CP. To begin heparin per pharmacy for ACS. No AC PTA. CBC ok on admission.  Goal of Therapy:  Heparin level 0.3-0.7 units/ml Monitor platelets by anticoagulation protocol: Yes   Plan:  Heparin IV bolus 4000 units Heparin gtt at 1600 units/hr Will f/u heparin level in 6 hours Daily heparin level and CBC  Sherlon Handing, PharmD, BCPS Please see amion for complete clinical pharmacist phone list 01/26/2021,2:20 AM

## 2021-01-26 NOTE — H&P (Addendum)
Cardiology Admission History and Physical:   Patient ID: Clinton Johnson MRN: [id number]; DOB: May 27, 1970   Admission date: 01/26/2021  PCP:  Patient, No Pcp Per (Inactive)   [facility] Providers Cardiologist:  None        Chief Complaint:  chest pain  Patient Profile:   Clinton Johnson is a 50 y.o. male with CAD and stent, CHF and HTN who is being seen 01/26/2021 for the evaluation of NSTEMI.  History of Present Illness:   Clinton Johnson with hx of stent 2 years ago in [place]( pt is a truck Geophysicist/field seismologist and was in [place] but lives in [place]).   Also HTN - he is on ASA but plavix has been stopped.  Pt unsure which coronary artery his stent was placed into.   No Diabetes or thyroid disease.  Not taking statin.   4 days before Thanksgiving he had some chest pressure that went away, then on Thanksgiving similar episode but again went away.  Then yesterday pain came on and would not go away.  He went to ER and with IV heparin, asa, morphine pain resolved.  No associated symptoms or N,V, diaphoresis or SOB.    Now on IV heparin with NTG paste sitting up in bed without complaints.  He is a smoker a PPD and drinks beer and [place] on weekends, never had DTs. No drugs.    In ER he was on nipride from chart. But maybe not started.    EKG:  The ECG that was done 01/26/21 was personally reviewed and demonstrates SR with LBBB QRS 121 ms has inf lat ST depression but similar to EKG 05/16/20   2V CXR no active disease  Na 135, K+ 2.5, BUN 14, Cr 1.34,  Hgb 13.8 plts 226, WBC 6.9 Neg covid and influenza  BP 133/98 P 82 R 17  No chest pain. Mother may have died from MI.    Past Medical History:  Diagnosis Date   CHF (congestive heart failure) (HCC)    Coronary artery disease    Hypertension     Past Surgical History:  Procedure Laterality Date   ABDOMINAL SURGERY       Medications Prior to Admission: Prior to Admission medications   Medication Sig Start Date End Date Taking?  Authorizing Provider  furosemide (LASIX) 20 MG tablet Take 1 tablet (20 mg total) by mouth daily. 05/15/20 06/14/20  Curatolo, Adam, DO  hydrochlorothiazide (HYDRODIURIL) 25 MG tablet Take 1 tablet (25 mg total) by mouth daily. 06/26/20 09/24/20  Orpah Greek, MD  omeprazole (PRILOSEC) 20 MG capsule Take 1 capsule (20 mg total) by mouth daily. 12/09/15   Palumbo, April, MD  potassium chloride (KLOR-CON) 10 MEQ tablet Take 1 tablet (10 mEq total) by mouth daily. 05/07/20   Quintella Reichert, MD  diphenhydrAMINE (BENADRYL) 25 MG tablet Take 1 tablet (25 mg total) by mouth every 6 (six) hours. Patient not taking: Reported on 12/09/2015 06/06/15 03/03/20  Jola Schmidt, MD  fluticasone [facility]) 50 MCG/ACT nasal spray Place 2 sprays into both nostrils daily. Patient not taking: Reported on 12/09/2015 06/06/15 03/03/20  Jola Schmidt, MD     Allergies:   No Known Allergies  Social History:   Social History   Socioeconomic History   Marital status: Single    Spouse name: Not on file   Number of children: Not on file   Years of education: Not on file   Highest education level: Not on file  Occupational History  Not on file  Tobacco Use   Smoking status: Every Day    Packs/day: 0.50    Types: Cigarettes   Smokeless tobacco: Never  Vaping Use   Vaping Use: Never used  Substance and Sexual Activity   Alcohol use: Yes   Drug use: Yes    Types: Marijuana   Sexual activity: Not on file  Other Topics Concern   Not on file  Social History Narrative   Not on file   Social Determinants of Health   Financial Resource Strain: Not on file  Food Insecurity: Not on file  Transportation Needs: Not on file  Physical Activity: Not on file  Stress: Not on file  Social Connections: Not on file  Intimate Partner Violence: Not on file    Family History:   The patient's family history includes Heart attack in his mother.    ROS:  Please see the history of present illness.  General:no colds or  fevers, no weight changes Skin:no rashes or ulcers HEENT:no blurred vision, no congestion CV:see HPI PUL:see HPI GI:no diarrhea constipation or melena, no indigestion GU:no hematuria, no dysuria MS:no joint pain, no claudication Neuro:no syncope, no lightheadedness Endo:no diabetes, no thyroid disease   All other ROS reviewed and negative.     Physical Exam/Data:   Vitals:   01/26/21 0730 01/26/21 0800 01/26/21 0900 01/26/21 1051  BP: 123/81 (!) 126/91 107/71 (!) 133/98  Pulse: 84 80 76 79  Resp: 18 16 12 16   Temp:    97.9 F (36.6 C)  TempSrc:    Oral  SpO2: 100% 99% 95% 100%  Weight:      Height:       No intake or output data in the 24 hours ending 01/26/21 1352 Last 3 Weights 01/26/2021 01/26/2021 06/26/2020  Weight (lbs) 279 lb 5.2 oz 285 lb 285 lb 15 oz  Weight (kg) 126.7 kg 129.275 kg 129.7 kg     Body mass index is 36.85 kg/m.  General:  Well nourished, well developed, in no acute distress HEENT: normal Neck: no JVD Vascular: No carotid bruits; Distal pulses 2+ bilaterally   Cardiac:  normal S1, S2; RRR; no murmur gallup rub or click Lungs:  clear to auscultation bilaterally, no wheezing, rhonchi or rales  Abd: soft, nontender, no hepatomegaly  Ext: no lower ext edema Musculoskeletal:  No deformities, BUE and BLE strength normal and equal Skin: warm and dry  Neuro:  alert and oriented X 3 MAE follows commands, no focal abnormalities noted Psych:  Normal affect     Relevant CV Studies: none  Laboratory Data:  High Sensitivity Troponin:   Recent Labs  Lab 01/26/21 0124 01/26/21 0320  TROPONINIHS 1,043* 821*      Chemistry Recent Labs  Lab 01/26/21 0123  NA 135  K 2.5*  CL 94*  CO2 30  GLUCOSE 115*  BUN 14  CREATININE 1.34*  CALCIUM 9.0  GFRNONAA >60  ANIONGAP 11    No results for input(s): PROT, ALBUMIN, AST, ALT, ALKPHOS, BILITOT in the last 168 hours. Lipids No results for input(s): CHOL, TRIG, HDL, LABVLDL, LDLCALC, CHOLHDL in the  last 168 hours. Hematology Recent Labs  Lab 01/26/21 0123 01/26/21 0556  WBC 6.9 6.4  RBC 4.89 4.16*  HGB 13.8 11.7*  HCT 42.0 35.8*  MCV 85.9 86.1  MCH 28.2 28.1  MCHC 32.9 32.7  RDW 14.6 14.8  PLT 226 200   Thyroid No results for input(s): TSH, FREET4 in the last 168 hours. BNPNo  results for input(s): BNP, PROBNP in the last 168 hours.  DDimer No results for input(s): DDIMER in the last 168 hours.   Radiology/Studies:  DG Chest 2 View  Result Date: 01/26/2021 CLINICAL DATA:  Intermittent chest pain x1 day. EXAM: CHEST - 2 VIEW COMPARISON:  May 07, 2020 FINDINGS: There is no evidence of acute infiltrate, pleural effusion or pneumothorax. The heart size and mediastinal contours are within normal limits. The visualized skeletal structures are unremarkable. IMPRESSION: No active cardiopulmonary disease. Electronically Signed   By: Virgina Norfolk M.D.   On: 01/26/2021 02:01     Assessment and Plan:   NSTEMI with pk hs Troponin 1,043 on IV heparin and po ASA has been given.  Currently pain free on NTG paste and IV heparin. Continue add BB and statin-high dose.  Will need cardiac cath Monday.  It appears he runs out of meds freq with elevated BP, check Echo.  CAD with hx of Stent to coronary artery in [facility].   HTN elevated add BB  HLD add high dose statin, was not on as outpt. Tobacco and ETOH abuse. Will order nicotine patch, he has never had DTs.     Risk Assessment/Risk Scores:    TIMI Risk Score for Unstable Angina or Non-ST Elevation MI:   The patient's TIMI risk score is 5, which indicates a 26% risk of all cause mortality, new or recurrent myocardial infarction or need for urgent revascularization in the next 14 days.       Severity of Illness: The appropriate patient status for this patient is INPATIENT. Inpatient status is judged to be reasonable and necessary in order to provide the required intensity of service to ensure the patient's safety. The  patient's presenting symptoms, physical exam findings, and initial radiographic and laboratory data in the context of their chronic comorbidities is felt to place them at high risk for further clinical deterioration. Furthermore, it is not anticipated that the patient will be medically stable for discharge from the hospital within 2 midnights of admission.   * I certify that at the point of admission it is my clinical judgment that the patient will require inpatient hospital care spanning beyond 2 midnights from the point of admission due to high intensity of service, high risk for further deterioration and high frequency of surveillance required.*   For questions or updates, please contact [place] Please consult [email or web address] for contact info under     Signed, Cecilie Kicks, NP  01/26/2021 1:52 PM    Patient seen and examined.  Agree with above documentation.  Clinton Johnson is a 50 year old male with a history of CAD status post PCI, tobacco use, hypertension who presents with NSTEMI.  He reports that he was on vacation in [place] 2 years ago and was admitted with MI.  Underwent stent but he is unsure of details.  He has not followed with cardiology since that time.  Does report he has been taking daily aspirin 81 mg.  However he does continue to smoke 1 pack/day.  He reports that earlier this week he started to have chest pain that resolved.  Worse with exertion.  Yesterday reports had onset of pain at rest.  EKG shows normal sinus rhythm, rate 85, Q waves V1/2, subtle diffuse ST depressions.  Labs notable for creatinine 1.3, potassium 2.5, sodium 135, hemoglobin 13.8, platelets 226, WBC 6.9, troponin 1043 > 821.  Chest x-ray unremarkable.  On exam, patient is alert and oriented, regular rate and  rhythm, no murmurs, lungs CTAB, no LE edema or JVD.  He is currently chest pain-free.  Presentation consistent with NSTEMI.  Continue aspirin 81 mg daily, will start heparin drip.  We will check  lipid panel and start statin.  Currently chest pain-free, can give sublingual nitroglycerin as needed.  Check echocardiogram.  Plan for [facility] on Monday.  Risks and benefits of cardiac catheterization have been discussed with the patient.  These include bleeding, infection, kidney damage, stroke, heart attack, death.  The patient understands these risks and is willing to proceed.  Labs are also notable for potassium 2.5.  Suspect due to HCTZ use.  Will hold HCTZ and replete potassium.  We will recheck potassium this afternoon.  Donato Heinz, MD

## 2021-01-26 NOTE — ED Notes (Signed)
Pt de-stated while sleeping. SpO2 mid 80%. Was placed on 2L of oxygen for support. SpO2 increased to 94%. Dr Stark Jock updated via secure chat.

## 2021-01-26 NOTE — ED Notes (Signed)
Report given to [facility]. 

## 2021-01-26 NOTE — Progress Notes (Signed)
ANTICOAGULATION CONSULT NOTE - Initial Consult  Pharmacy Consult for heparin Indication: chest pain/ACS  No Known Allergies  Patient Measurements: Height: 6\' 1"  (185.4 cm) Weight: (S) 126.7 kg (279 lb 5.2 oz) IBW/kg (Calculated) : 79.9 Heparin Dosing Weight: 108.7 kg  Vital Signs: Temp: 97.9 F (36.6 C) (11/26 1051) Temp Source: Oral (11/26 1051) BP: 133/98 (11/26 1051) Pulse Rate: 79 (11/26 1051)  Labs: Recent Labs    01/26/21 0123 01/26/21 0124 01/26/21 0320 01/26/21 0556 01/26/21 1155  HGB 13.8  --   --  11.7*  --   HCT 42.0  --   --  35.8*  --   PLT 226  --   --  200  --   HEPARINUNFRC  --   --   --   --  0.28*  CREATININE 1.34*  --   --   --   --   TROPONINIHS  --  1,043* 821*  --   --     Estimated Creatinine Clearance: 89.9 mL/min (A) (by C-G formula based on SCr of 1.34 mg/dL (H)).   Medical History: Past Medical History:  Diagnosis Date   CHF (congestive heart failure) (HCC)    Hypertension     Medications:  Infusions:   heparin 1,600 Units/hr (01/26/21 0232)    Assessment: 50 yo M here for chest pain/ACS from OSH. Already on heparin gtt for ACS/chest pain transferred to [facility]. Heparin level on admit 0.28 on gtt at 1600 units/hr.   Hgb 11.7 and plts 200 at admit. No s/x of bleeding per RN.   Goal of Therapy:  Heparin level 0.3-0.7 units/ml Monitor platelets by anticoagulation protocol: Yes   Plan:  [place] 1600 units heparin x1 Increase heparin gtt to 1800 units/hr Check HL in 6 hours Daily HL and CBC while on heparin F/u plans for cath/intervention and duration of heparin gtt   Clinton Johnson, PharmD PGY2 Cardiology Pharmacy Resident Phone: [phone]  01/26/2021  12:45 PM  Please check [email or web address] for unit-specific pharmacy phone numbers.  Bela Nyborg A Bryanda Mikel 01/26/2021,12:32 PM

## 2021-01-26 NOTE — ED Notes (Signed)
Pt transported to [facility] via [facility]

## 2021-01-26 NOTE — ED Provider Notes (Signed)
[facility] EMERGENCY DEPARTMENT Provider Note   CSN: [id number] Arrival date & time: 01/26/21  0102     History Chief Complaint  Patient presents with   Chest Pain    Sanad Fearnow is a 50 y.o. male.  Patient is a 50 year old male with past medical history of coronary artery disease with stent, CHF, hypertension.  Patient presents with complaints of chest pain.  He describes a "throbbing" to the right side of his chest and shoulder.  This started yesterday, then went away and returned this evening.  He denies shortness of breath, nausea, diaphoresis.  He denies any fevers, chills, or cough.  He reports a stent last year.  The history is provided by the patient.  Chest Pain Pain location:  R chest Pain quality: throbbing   Pain radiates to:  R shoulder Pain severity:  Moderate Onset quality:  Sudden Duration:  1 day Timing:  Constant Progression:  Worsening Chronicity:  New Relieved by:  Nothing Worsened by:  Nothing     Past Medical History:  Diagnosis Date   CHF (congestive heart failure) (HCC)    Hypertension     Patient Active Problem List   Diagnosis Date Noted   Acute exacerbation of CHF (congestive heart failure) ([place]) 12/05/2019    Past Surgical History:  Procedure Laterality Date   ABDOMINAL SURGERY         History reviewed. No pertinent family history.  Social History   Tobacco Use   Smoking status: Every Day    Packs/day: 0.50    Types: Cigarettes   Smokeless tobacco: Never  Vaping Use   Vaping Use: Never used  Substance Use Topics   Alcohol use: Yes   Drug use: Yes    Types: Marijuana    Home Medications Prior to Admission medications   Medication Sig Start Date End Date Taking? Authorizing Provider  furosemide (LASIX) 20 MG tablet Take 1 tablet (20 mg total) by mouth daily. 05/15/20 06/14/20  Curatolo, Adam, DO  hydrochlorothiazide (HYDRODIURIL) 25 MG tablet Take 1 tablet (25 mg total) by mouth daily. 06/26/20 09/24/20   Orpah Greek, MD  omeprazole (PRILOSEC) 20 MG capsule Take 1 capsule (20 mg total) by mouth daily. 12/09/15   Palumbo, April, MD  potassium chloride (KLOR-CON) 10 MEQ tablet Take 1 tablet (10 mEq total) by mouth daily. 05/07/20   Quintella Reichert, MD  diphenhydrAMINE (BENADRYL) 25 MG tablet Take 1 tablet (25 mg total) by mouth every 6 (six) hours. Patient not taking: Reported on 12/09/2015 06/06/15 03/03/20  Jola Schmidt, MD  fluticasone [facility]) 50 MCG/ACT nasal spray Place 2 sprays into both nostrils daily. Patient not taking: Reported on 12/09/2015 06/06/15 03/03/20  Jola Schmidt, MD    Allergies    Patient has no known allergies.  Review of Systems   Review of Systems  Cardiovascular:  Positive for chest pain.  All other systems reviewed and are negative.  Physical Exam Updated Vital Signs BP (!) 144/98   Pulse 97   Temp 98 F (36.7 C) (Oral)   Resp 15   Ht 6\' 1"  (1.854 m)   Wt 129.3 kg   SpO2 96%   BMI 37.60 kg/m   Physical Exam Vitals and nursing note reviewed.  Constitutional:      General: He is not in acute distress.    Appearance: He is well-developed. He is not diaphoretic.  HENT:     Head: Normocephalic and atraumatic.  Cardiovascular:     Rate and Rhythm:  Normal rate and regular rhythm.     Heart sounds: No murmur heard.   No friction rub.  Pulmonary:     Effort: Pulmonary effort is normal. No respiratory distress.     Breath sounds: Normal breath sounds. No wheezing or rales.  Abdominal:     General: Bowel sounds are normal. There is no distension.     Palpations: Abdomen is soft.     Tenderness: There is no abdominal tenderness.  Musculoskeletal:        General: Normal range of motion.     Cervical back: Normal range of motion and neck supple.     Right lower leg: No tenderness. No edema.     Left lower leg: No tenderness. No edema.  Skin:    General: Skin is warm and dry.  Neurological:     Mental Status: He is alert and oriented to person,  place, and time.     Coordination: Coordination normal.    ED Results / Procedures / Treatments   Labs (all labs ordered are listed, but only abnormal results are displayed) Labs Reviewed  CBC  BASIC METABOLIC PANEL  TROPONIN I (HIGH SENSITIVITY)    EKG None  Radiology No results found.  Procedures Procedures   Medications Ordered in ED Medications  ketorolac (TORADOL) 30 MG/ML injection 30 mg (has no administration in time range)    ED Course  I have reviewed the triage vital signs and the nursing notes.  Pertinent labs & imaging results that were available during my care of the patient were reviewed by me and considered in my medical decision making (see chart for details).    MDM Rules/Calculators/A&P  Patient presenting here with complaints of a "throbbing" in the right side of his chest that feels similar to what he experienced with his prior MI in 2021.  He received a stent at a hospital in [facility].  Patient's EKG was unchanged, however troponin has returned at 1043 consistent with non-STEMI.  Patient started on heparin, given aspirin, and nitroglycerin paste applied.  Care discussed with Dr. Humphrey Rolls from cardiology and patient will be admitted at [facility] for non-STEMI.  CRITICAL CARE Performed by: Veryl Speak Total critical care time: 40 minutes Critical care time was exclusive of separately billable procedures and treating other patients. Critical care was necessary to treat or prevent imminent or life-threatening deterioration. Critical care was time spent personally by me on the following activities: development of treatment plan with patient and/or surrogate as well as nursing, discussions with consultants, evaluation of patient's response to treatment, examination of patient, obtaining history from patient or surrogate, ordering and performing treatments and interventions, ordering and review of laboratory studies, ordering and review of radiographic  studies, pulse oximetry and re-evaluation of patient's condition.   Final Clinical Impression(s) / ED Diagnoses Final diagnoses:  None    Rx / DC Orders ED Discharge Orders     None        Veryl Speak, MD 01/26/21 [phone]

## 2021-01-26 NOTE — ED Notes (Signed)
Discussed pt with Dr Stark Jock. Pt increased to 4 L nasal cannula SpO2 increased 92-100%.

## 2021-01-26 NOTE — Progress Notes (Signed)
Critical lab value Troponin 1,642 called to Dr. Oval Linsey

## 2021-01-26 NOTE — ED Triage Notes (Signed)
Pt c/o intermittent chest pain since yesterday. Has HX cardiac stent. Endorses ETOH PTA

## 2021-01-26 NOTE — Progress Notes (Signed)
ANTICOAGULATION CONSULT NOTE - Initial Consult  Pharmacy Consult for heparin Indication: chest pain/ACS  No Known Allergies  Patient Measurements: Height: 6\' 1"  (185.4 cm) Weight: (S) 126.7 kg (279 lb 5.2 oz) IBW/kg (Calculated) : 79.9 Heparin Dosing Weight: 108.7 kg  Vital Signs: Temp: 98.4 F (36.9 C) (11/26 1915) Temp Source: Oral (11/26 1915) BP: 123/90 (11/26 1915) Pulse Rate: 83 (11/26 1915)  Labs: Recent Labs    01/26/21 0123 01/26/21 0124 01/26/21 0320 01/26/21 0556 01/26/21 1155 01/26/21 1356 01/26/21 1844  HGB 13.8  --   --  11.7*  --   --   --   HCT 42.0  --   --  35.8*  --   --   --   PLT 226  --   --  200  --   --   --   HEPARINUNFRC  --   --   --   --  0.28*  --  0.35  CREATININE 1.34*  --   --   --   --  1.15  --   TROPONINIHS  --  1,043* 821*  --   --  1,642*  --      Estimated Creatinine Clearance: 104.8 mL/min (by C-G formula based on SCr of 1.15 mg/dL).   Medical History: Past Medical History:  Diagnosis Date   CHF (congestive heart failure) (HCC)    Coronary artery disease    Hypertension     Medications:  Infusions:   sodium chloride     heparin 1,800 Units/hr (01/26/21 1339)    Assessment: 50 yo M here for chest pain/ACS from OSH. Already on heparin gtt for ACS/chest pain transferred to [facility]. Heparin level on admit 0.28 on gtt at 1600 units/hr.   Hgb 11.7 and plts 200 at admit. No s/x of bleeding per RN.   11/26 PM: evening heparin level therapeutic at 0.38. No issues with IV site or bleeding noted.   Goal of Therapy: Heparin level 0.3 - 0.5 units/mL Monitor platelets by anticoagulation protocol: Yes   Plan: continue heparin gtt to 1800 units/hr F/u Daily HL and CBC while on heparin F/u plans for cath/intervention and duration of heparin gtt   Joetta Manners, PharmD, [facility] Clinical Pharmacist ED RPh Phone: [place]: [phone]

## 2021-01-27 ENCOUNTER — Inpatient Hospital Stay (HOSPITAL_COMMUNITY): Payer: Self-pay

## 2021-01-27 DIAGNOSIS — I214 Non-ST elevation (NSTEMI) myocardial infarction: Secondary | ICD-10-CM

## 2021-01-27 LAB — CBC
HCT: 41.6 % (ref 39.0–52.0)
Hemoglobin: 13.6 g/dL (ref 13.0–17.0)
MCH: 28.3 pg (ref 26.0–34.0)
MCHC: 32.7 g/dL (ref 30.0–36.0)
MCV: 86.5 fL (ref 80.0–100.0)
Platelets: 216 10*3/uL (ref 150–400)
RBC: 4.81 MIL/uL (ref 4.22–5.81)
RDW: 14.7 % (ref 11.5–15.5)
WBC: 6.7 10*3/uL (ref 4.0–10.5)
nRBC: 0 % (ref 0.0–0.2)

## 2021-01-27 LAB — BASIC METABOLIC PANEL
Anion gap: 10 (ref 5–15)
BUN: 13 mg/dL (ref 6–20)
CO2: 27 mmol/L (ref 22–32)
Calcium: 8.6 mg/dL — ABNORMAL LOW (ref 8.9–10.3)
Chloride: 99 mmol/L (ref 98–111)
Creatinine, Ser: 1.12 mg/dL (ref 0.61–1.24)
GFR, Estimated: >60 mL/min (ref >60)
Glucose, Bld: 135 mg/dL — ABNORMAL HIGH (ref 70–99)
Potassium: 3.1 mmol/L — ABNORMAL LOW (ref 3.5–5.1)
Sodium: 136 mmol/L (ref 135–145)

## 2021-01-27 LAB — LIPID PANEL
Cholesterol: 238 mg/dL — ABNORMAL HIGH (ref 0–200)
HDL: 44 mg/dL (ref >40)
LDL Cholesterol: 143 mg/dL — ABNORMAL HIGH (ref 0–99)
Total CHOL/HDL Ratio: 5.4 RATIO
Triglycerides: 257 mg/dL — ABNORMAL HIGH (ref <150)
VLDL: 51 mg/dL — ABNORMAL HIGH (ref 0–40)

## 2021-01-27 LAB — HEPARIN LEVEL (UNFRACTIONATED)
Heparin Unfractionated: 0.19 IU/mL — ABNORMAL LOW (ref 0.30–0.70)
Heparin Unfractionated: 0.43 IU/mL (ref 0.30–0.70)
Heparin Unfractionated: 0.38 IU/mL (ref 0.30–0.70)

## 2021-01-27 LAB — ECHOCARDIOGRAM COMPLETE
Area-P 1/2: 3.46 cm2
Calc EF: 49.2 %
Height: 73 in
S' Lateral: 3.7 cm
Single Plane A2C EF: 45.2 %
Single Plane A4C EF: 51.4 %
Weight: 4469.17 oz

## 2021-01-27 LAB — TROPONIN I (HIGH SENSITIVITY): Troponin I (High Sensitivity): 2948 ng/L (ref <18)

## 2021-01-27 MED ORDER — ASPIRIN 81 MG PO CHEW: 81.0000 mg | CHEWABLE_TABLET | ORAL | Status: DC

## 2021-01-27 MED ORDER — SODIUM CHLORIDE 0.9 % IV SOLN: 250.0000 mL | INTRAVENOUS | Status: DC | PRN

## 2021-01-27 MED ORDER — POTASSIUM CHLORIDE CRYS ER 20 MEQ PO TBCR
40.0000 meq | EXTENDED_RELEASE_TABLET | Freq: Once | ORAL | Status: AC
  Administered 2021-01-27: 10:00:00 40 meq via ORAL
  Filled 2021-01-27: qty 2

## 2021-01-27 MED ORDER — POTASSIUM CHLORIDE CRYS ER 20 MEQ PO TBCR
40.0000 meq | EXTENDED_RELEASE_TABLET | Freq: Once | ORAL | Status: AC
  Administered 2021-01-27: 16:00:00 40 meq via ORAL
  Filled 2021-01-27: qty 2

## 2021-01-27 MED ORDER — SODIUM CHLORIDE 0.9 % WEIGHT BASED INFUSION
3.0000 mL/kg/h | INTRAVENOUS | Status: AC
  Administered 2021-01-28: 3 mL/kg/h via INTRAVENOUS

## 2021-01-27 MED ORDER — SODIUM CHLORIDE 0.9% FLUSH: 3.0000 mL | INTRAVENOUS | Status: DC | PRN

## 2021-01-27 MED ORDER — LOSARTAN POTASSIUM 25 MG PO TABS
25.0000 mg | ORAL_TABLET | Freq: Every day | ORAL | Status: DC
  Administered 2021-01-27 – 2021-01-29 (×2): 25 mg via ORAL
  Filled 2021-01-27 (×3): qty 1

## 2021-01-27 MED ORDER — SODIUM CHLORIDE 0.9 % WEIGHT BASED INFUSION: 1.0000 mL/kg/h | INTRAVENOUS | Status: DC

## 2021-01-27 NOTE — Progress Notes (Signed)
ANTICOAGULATION CONSULT NOTE  Pharmacy Consult for heparin Indication: chest pain/ACS  No Known Allergies  Patient Measurements: Height: 6\' 1"  (185.4 cm) Weight: (S) 126.7 kg (279 lb 5.2 oz) IBW/kg (Calculated) : 79.9 Heparin Dosing Weight: 108.7 kg  Vital Signs: Temp: 97.8 F (36.6 C) (11/27 1903) Temp Source: Oral (11/27 1903) BP: 118/77 (11/27 1903) Pulse Rate: 88 (11/27 1903)  Labs: Recent Labs    01/26/21 0123 01/26/21 0124 01/26/21 0320 01/26/21 0556 01/26/21 1155 01/26/21 1356 01/26/21 1844 01/27/21 0053 01/27/21 0531 01/27/21 0908 01/27/21 1626  HGB 13.8  --   --  11.7*  --   --   --  13.6  --   --   --   HCT 42.0  --   --  35.8*  --   --   --  41.6  --   --   --   PLT 226  --   --  200  --   --   --  216  --   --   --   HEPARINUNFRC  --   --   --   --    < >  --    < > 0.19*  --  0.43 0.38  CREATININE 1.34*  --   --   --   --  1.15  --  1.12  --   --   --   TROPONINIHS  --    < > 821*  --   --  1,642*  --   --  2,948*  --   --    < > = values in this interval not displayed.     Estimated Creatinine Clearance: 107.6 mL/min (by C-G formula based on SCr of 1.12 mg/dL).   Assessment: 50 yo M here for chest pain/ACS from OSH. Already on heparin gtt for ACS/chest pain transferred to [facility].   Heparin drip 2050 uts/hr heparin level 0.38 at goal   Hemoglobin and plts stable at 13.6 and 216 respectively. No issues with line or bleeding reported per RN. Plan for cath on Monday.  Goal of Therapy:  Heparin level 0.3 - 0.7 units/mL Monitor platelets by anticoagulation protocol: Yes   Plan:  Continue heparin gtt at 2050 units/hr Daily heparin level and CBC while on heparin   Bonnita Nasuti Pharm.D. CPP, BCPS Clinical Pharmacist [phone] 01/27/2021 7:38 PM    Please check [email or web address] for unit-specific pharmacy phone numbers.

## 2021-01-27 NOTE — Progress Notes (Addendum)
Progress Note  Patient Name: Clinton Johnson Date of Encounter: 01/27/2021  [facility] Cardiologist: Donato Heinz, MD   Subjective   Denies any chest pain or dyspnea  Inpatient Medications    Scheduled Meds:  aspirin EC  81 mg Oral Daily   atorvastatin  80 mg Oral Daily   losartan  25 mg Oral Daily   metoprolol tartrate  25 mg Oral BID   nicotine  14 mg Transdermal Daily   nitroGLYCERIN  1 inch Topical Q6H   potassium chloride  40 mEq Oral Once   sodium chloride flush  3 mL Intravenous Q12H   Continuous Infusions:  sodium chloride     heparin 2,050 Units/hr (01/27/21 0623)   PRN Meds: acetaminophen, ALPRAZolam, nitroGLYCERIN, ondansetron (ZOFRAN) IV   Vital Signs    Vitals:   01/26/21 1915 01/26/21 2313 01/27/21 0327 01/27/21 0739  BP: 123/90 (!) 129/91 (!) 142/90 (!) 130/92  Pulse: 83 86 77 77  Resp: (!) 26 (!) 24 13 18   Temp: 98.4 F (36.9 C) 97.7 F (36.5 C) 97.7 F (36.5 C) 97.9 F (36.6 C)  TempSrc: Oral Oral Oral Oral  SpO2: 94% 97% 96% 94%  Weight:      Height:        Intake/Output Summary (Last 24 hours) at 01/27/2021 1002 Last data filed at 01/27/2021 0800 Gross per 24 hour  Intake 927.5 ml  Output --  Net 927.5 ml   Last 3 Weights 01/26/2021 01/26/2021 06/26/2020  Weight (lbs) 279 lb 5.2 oz 285 lb 285 lb 15 oz  Weight (kg) 126.7 kg 129.275 kg 129.7 kg      Telemetry    Normal sinus rhythm- Personally Reviewed  ECG    Normal sinus rhythm, rate 82, Q waves in V1/2, less than 1 mm ST depressions in 1, aVL, V4-6- Personally Reviewed  Physical Exam   GEN: No acute distress.   Neck: No JVD Cardiac: RRR, no murmurs, rubs, or gallops.  Respiratory: Clear to auscultation bilaterally. GI: Soft, nontender, non-distended  MS: No edema; No deformity. Neuro:  Nonfocal  Psych: Normal affect   Labs    High Sensitivity Troponin:   Recent Labs  Lab 01/26/21 0124 01/26/21 0320 01/26/21 1356 01/27/21 0531  TROPONINIHS 1,043*  821* 1,642* 2,948*     Chemistry Recent Labs  Lab 01/26/21 0123 01/26/21 1356 01/27/21 0053  NA 135 139 136  K 2.5* 3.3* 3.1*  CL 94* 98 99  CO2 30 30 27   GLUCOSE 115* 121* 135*  BUN 14 12 13   CREATININE 1.34* 1.15 1.12  CALCIUM 9.0 9.5 8.6*  MG  --  2.1  --   PROT  --  8.2*  --   ALBUMIN  --  4.0  --   AST  --  30  --   ALT  --  15  --   ALKPHOS  --  76  --   BILITOT  --  0.9  --   GFRNONAA >60 >60 >60  ANIONGAP 11 11 10     Lipids  Recent Labs  Lab 01/27/21 0053  CHOL 238*  TRIG 257*  HDL 44  LDLCALC 143*  CHOLHDL 5.4    Hematology Recent Labs  Lab 01/26/21 0123 01/26/21 0556 01/27/21 0053  WBC 6.9 6.4 6.7  RBC 4.89 4.16* 4.81  HGB 13.8 11.7* 13.6  HCT 42.0 35.8* 41.6  MCV 85.9 86.1 86.5  MCH 28.2 28.1 28.3  MCHC 32.9 32.7 32.7  RDW 14.6 14.8 14.7  PLT 226 200 216   Thyroid  Recent Labs  Lab 01/26/21 1356  TSH 2.594    BNPNo results for input(s): BNP, PROBNP in the last 168 hours.  DDimer No results for input(s): DDIMER in the last 168 hours.   Radiology    DG Chest 2 View  Result Date: 01/26/2021 CLINICAL DATA:  Intermittent chest pain x1 day. EXAM: CHEST - 2 VIEW COMPARISON:  May 07, 2020 FINDINGS: There is no evidence of acute infiltrate, pleural effusion or pneumothorax. The heart size and mediastinal contours are within normal limits. The visualized skeletal structures are unremarkable. IMPRESSION: No active cardiopulmonary disease. Electronically Signed   By: Virgina Norfolk M.D.   On: 01/26/2021 02:01    Cardiac Studies     Patient Profile     50 y.o. male with a history of CAD status post PCI, tobacco use, hypertension who presents with NSTEMI  Assessment & Plan    NSTEMI: Troponin up to 2948.  History of PCI at [facility] 2 years ago, he is unclear of details and did not follow-up with cardiology.  Currently chest pain-free -Plan LHC tomorrow.  Risks and benefits of cardiac catheterization have been discussed with the  patient.  These include bleeding, infection, kidney damage, stroke, heart attack, death.  The patient understands these risks and is willing to proceed. -Echocardiogram -Continue aspirin 81 mg daily -Continue heparin drip -Continue atorvastatin 80 mg daily -Continue metoprolol 25 mg twice daily -As needed sublingual nitroglycerin.  Currently chest pain-free  Hypertension: BP has been persistently elevated.  On HCTZ at home, has been held due to hypokalemia.  Started metoprolol 25 mg twice daily, will add losartan 25 mg daily  Hyperlipidemia: LDL 143, will start atorvastatin 80 mg daily  Hypokalemia: Unclear cause, has persisted despite repletion and holding HCTZ.  Given hypertension and hypokalemia, will check renin/aldosterone to evaluate for hyperaldosteronism.  Added losartan as above  Tobacco use: Patient counseled on the risk of tobacco use and cessation strongly encouraged  For questions or updates, please contact [place] Please consult [email or web address] for contact info under        Signed, Donato Heinz, MD  01/27/2021, 10:02 AM

## 2021-01-27 NOTE — Progress Notes (Signed)
Patient is refusing to take metoprolol. He googled the medication and states there are too many side effects. He also states that after discharge he is not willing to take any new medications. Educated patient on purpose and importance of a beta blocker.

## 2021-01-27 NOTE — Progress Notes (Signed)
ANTICOAGULATION CONSULT NOTE  Pharmacy Consult for heparin Indication: chest pain/ACS  No Known Allergies  Patient Measurements: Height: 6\' 1"  (185.4 cm) Weight: (S) 126.7 kg (279 lb 5.2 oz) IBW/kg (Calculated) : 79.9 Heparin Dosing Weight: 108.7 kg  Vital Signs: Temp: 97.7 F (36.5 C) (11/26 2313) Temp Source: Oral (11/26 2313) BP: 129/91 (11/26 2313) Pulse Rate: 86 (11/26 2313)  Labs: Recent Labs    01/26/21 0123 01/26/21 0124 01/26/21 0320 01/26/21 0556 01/26/21 1155 01/26/21 1356 01/26/21 1844 01/27/21 0053  HGB 13.8  --   --  11.7*  --   --   --  13.6  HCT 42.0  --   --  35.8*  --   --   --  41.6  PLT 226  --   --  200  --   --   --  216  HEPARINUNFRC  --   --   --   --  0.28*  --  0.35 0.19*  CREATININE 1.34*  --   --   --   --  1.15  --  1.12  TROPONINIHS  --  1,043* 821*  --   --  1,642*  --   --      Estimated Creatinine Clearance: 107.6 mL/min (by C-G formula based on SCr of 1.12 mg/dL).   Assessment: 50 yo M here for chest pain/ACS from OSH. Already on heparin gtt for ACS/chest pain transferred to [facility].   Heparin level down to subtherapeutic (0.19) on infusion at 1800 units/hr. No issues with line or bleeding reported per RN. Plan for cath on Monday.  Goal of Therapy: Heparin level 0.3 - 0.7 units/mL Monitor platelets by anticoagulation protocol: Yes   Plan:  Increase heparin gtt to 2050 units/hr F/u 6 hr heparin level  Sherlon Handing, PharmD, BCPS Please see amion for complete clinical pharmacist phone list 01/27/2021 1:59 AM

## 2021-01-27 NOTE — Progress Notes (Signed)
*  PRELIMINARY RESULTS* Echocardiogram 2D Echocardiogram has been performed.  Clinton Johnson 01/27/2021, 4:08 PM

## 2021-01-27 NOTE — Progress Notes (Signed)
ANTICOAGULATION CONSULT NOTE  Pharmacy Consult for heparin Indication: chest pain/ACS  No Known Allergies  Patient Measurements: Height: 6\' 1"  (185.4 cm) Weight: (S) 126.7 kg (279 lb 5.2 oz) IBW/kg (Calculated) : 79.9 Heparin Dosing Weight: 108.7 kg  Vital Signs: Temp: 97.9 F (36.6 C) (11/27 0739) Temp Source: Oral (11/27 0739) BP: 130/92 (11/27 0739) Pulse Rate: 77 (11/27 0739)  Labs: Recent Labs    01/26/21 0123 01/26/21 0124 01/26/21 0320 01/26/21 0556 01/26/21 1155 01/26/21 1356 01/26/21 1844 01/27/21 0053 01/27/21 0531 01/27/21 0908  HGB 13.8  --   --  11.7*  --   --   --  13.6  --   --   HCT 42.0  --   --  35.8*  --   --   --  41.6  --   --   PLT 226  --   --  200  --   --   --  216  --   --   HEPARINUNFRC  --   --   --   --    < >  --  0.35 0.19*  --  0.43  CREATININE 1.34*  --   --   --   --  1.15  --  1.12  --   --   TROPONINIHS  --    < > 821*  --   --  1,642*  --   --  2,948*  --    < > = values in this interval not displayed.     Estimated Creatinine Clearance: 107.6 mL/min (by C-G formula based on SCr of 1.12 mg/dL).   Assessment: 50 yo M here for chest pain/ACS from OSH. Already on heparin gtt for ACS/chest pain transferred to [facility].   AM heparin level was subtherapeutic at 0.19, so gtt was increased to 2050 units/hr. Heparin level now therapeutic at 0.43 on 2050 units/hr.   Hemoglobin and plts stable at 13.6 and 216 respectively. No issues with line or bleeding reported per RN. Plan for cath on Monday.  Goal of Therapy:  Heparin level 0.3 - 0.7 units/mL Monitor platelets by anticoagulation protocol: Yes   Plan:  Continue heparin gtt at 2050 units/hr F/u 6 hr heparin level Daily heparin level and CBC while on heparin  Cathrine Muster, PharmD PGY2 Cardiology Pharmacy Resident Phone: [phone] 01/27/2021  10:08 AM  Please check [email or web address] for unit-specific pharmacy phone numbers.

## 2021-01-28 ENCOUNTER — Encounter (HOSPITAL_COMMUNITY): Payer: Self-pay | Admitting: Cardiovascular Disease

## 2021-01-28 ENCOUNTER — Encounter (HOSPITAL_COMMUNITY)
Admission: EM | Disposition: A | Discharge status: Home / Self Care | Payer: Self-pay | Source: Home / Self Care | Attending: Cardiovascular Disease

## 2021-01-28 ENCOUNTER — Inpatient Hospital Stay (HOSPITAL_COMMUNITY): Payer: Self-pay

## 2021-01-28 DIAGNOSIS — I2510 Atherosclerotic heart disease of native coronary artery without angina pectoris: Secondary | ICD-10-CM

## 2021-01-28 DIAGNOSIS — E782 Mixed hyperlipidemia: Secondary | ICD-10-CM

## 2021-01-28 DIAGNOSIS — I255 Ischemic cardiomyopathy: Secondary | ICD-10-CM

## 2021-01-28 HISTORY — PX: LEFT HEART CATH AND CORONARY ANGIOGRAPHY: CATH118249

## 2021-01-28 HISTORY — PX: INTRAVASCULAR PRESSURE WIRE/FFR STUDY: CATH118243

## 2021-01-28 LAB — CBC
HCT: 41 % (ref 39.0–52.0)
Hemoglobin: 13.1 g/dL (ref 13.0–17.0)
MCH: 28.1 pg (ref 26.0–34.0)
MCHC: 32 g/dL (ref 30.0–36.0)
MCV: 88 fL (ref 80.0–100.0)
Platelets: 220 10*3/uL (ref 150–400)
RBC: 4.66 MIL/uL (ref 4.22–5.81)
RDW: 15.1 % (ref 11.5–15.5)
WBC: 6.7 10*3/uL (ref 4.0–10.5)
nRBC: 0 % (ref 0.0–0.2)

## 2021-01-28 LAB — ECHOCARDIOGRAM LIMITED
Height: 73 in
Weight: 4469.17 oz

## 2021-01-28 LAB — HEPARIN LEVEL (UNFRACTIONATED): Heparin Unfractionated: 0.39 IU/mL (ref 0.30–0.70)

## 2021-01-28 LAB — BASIC METABOLIC PANEL
Anion gap: 8 (ref 5–15)
BUN: 11 mg/dL (ref 6–20)
CO2: 26 mmol/L (ref 22–32)
Calcium: 8.7 mg/dL — ABNORMAL LOW (ref 8.9–10.3)
Chloride: 105 mmol/L (ref 98–111)
Creatinine, Ser: 1.27 mg/dL — ABNORMAL HIGH (ref 0.61–1.24)
GFR, Estimated: >60 mL/min (ref >60)
Glucose, Bld: 131 mg/dL — ABNORMAL HIGH (ref 70–99)
Potassium: 3.4 mmol/L — ABNORMAL LOW (ref 3.5–5.1)
Sodium: 139 mmol/L (ref 135–145)

## 2021-01-28 LAB — HEMOGLOBIN A1C
Hgb A1c MFr Bld: 5.8 % — ABNORMAL HIGH (ref 4.8–5.6)
Mean Plasma Glucose: 120 mg/dL

## 2021-01-28 LAB — POCT ACTIVATED CLOTTING TIME: Activated Clotting Time: 283 seconds

## 2021-01-28 SURGERY — LEFT HEART CATH AND CORONARY ANGIOGRAPHY
Anesthesia: LOCAL

## 2021-01-28 MED ORDER — HEPARIN SODIUM (PORCINE) 1000 UNIT/ML IJ SOLN
INTRAMUSCULAR | Status: AC
  Filled 2021-01-28: qty 10

## 2021-01-28 MED ORDER — VERAPAMIL HCL 2.5 MG/ML IV SOLN
INTRAVENOUS | Status: AC
  Filled 2021-01-28: qty 2

## 2021-01-28 MED ORDER — SODIUM CHLORIDE 0.9% FLUSH: 3.0000 mL | INTRAVENOUS | Status: DC | PRN

## 2021-01-28 MED ORDER — MIDAZOLAM HCL 2 MG/2ML IJ SOLN
INTRAMUSCULAR | Status: DC | PRN
  Administered 2021-01-28 (×2): 1 mg via INTRAVENOUS

## 2021-01-28 MED ORDER — HEPARIN (PORCINE) IN NACL 1000-0.9 UT/500ML-% IV SOLN
INTRAVENOUS | Status: AC
  Filled 2021-01-28: qty 500

## 2021-01-28 MED ORDER — FENTANYL CITRATE (PF) 100 MCG/2ML IJ SOLN
INTRAMUSCULAR | Status: AC
  Filled 2021-01-28: qty 2

## 2021-01-28 MED ORDER — VERAPAMIL HCL 2.5 MG/ML IV SOLN
INTRA_ARTERIAL | Status: DC | PRN
  Administered 2021-01-28: 09:00:00 10 mL via INTRA_ARTERIAL
  Administered 2021-01-28: 10:00:00 5 mL via INTRA_ARTERIAL

## 2021-01-28 MED ORDER — LABETALOL HCL 5 MG/ML IV SOLN: 10.0000 mg | INTRAVENOUS | Status: AC | PRN

## 2021-01-28 MED ORDER — POTASSIUM CHLORIDE CRYS ER 20 MEQ PO TBCR
40.0000 meq | EXTENDED_RELEASE_TABLET | Freq: Once | ORAL | Status: AC
  Administered 2021-01-28: 12:00:00 40 meq via ORAL
  Filled 2021-01-28: qty 2

## 2021-01-28 MED ORDER — LIDOCAINE HCL (PF) 1 % IJ SOLN
INTRAMUSCULAR | Status: AC
  Filled 2021-01-28: qty 30

## 2021-01-28 MED ORDER — MORPHINE SULFATE (PF) 2 MG/ML IV SOLN: 2.0000 mg | INTRAVENOUS | Status: DC | PRN

## 2021-01-28 MED ORDER — HEPARIN SODIUM (PORCINE) 1000 UNIT/ML IJ SOLN
INTRAMUSCULAR | Status: DC | PRN
  Administered 2021-01-28: 6000 [IU] via INTRAVENOUS
  Administered 2021-01-28: 7000 [IU] via INTRAVENOUS

## 2021-01-28 MED ORDER — FENTANYL CITRATE (PF) 100 MCG/2ML IJ SOLN
INTRAMUSCULAR | Status: DC | PRN
  Administered 2021-01-28 (×2): 25 ug via INTRAVENOUS

## 2021-01-28 MED ORDER — SODIUM CHLORIDE 0.9% FLUSH
3.0000 mL | Freq: Two times a day (BID) | INTRAVENOUS | Status: DC
  Administered 2021-01-28 – 2021-01-29 (×2): 3 mL via INTRAVENOUS

## 2021-01-28 MED ORDER — SODIUM CHLORIDE 0.9 % IV SOLN: INTRAVENOUS | Status: AC

## 2021-01-28 MED ORDER — MIDAZOLAM HCL 2 MG/2ML IJ SOLN
INTRAMUSCULAR | Status: AC
  Filled 2021-01-28: qty 2

## 2021-01-28 MED ORDER — TICAGRELOR 90 MG PO TABS
ORAL_TABLET | ORAL | Status: DC | PRN
  Administered 2021-01-28: 180 mg via ORAL

## 2021-01-28 MED ORDER — IOHEXOL 350 MG/ML SOLN
INTRAVENOUS | Status: DC | PRN
  Administered 2021-01-28: 10:00:00 100 mL

## 2021-01-28 MED ORDER — LIDOCAINE HCL (PF) 1 % IJ SOLN
INTRAMUSCULAR | Status: DC | PRN
  Administered 2021-01-28: 2 mL

## 2021-01-28 MED ORDER — ONDANSETRON HCL 4 MG/2ML IJ SOLN: 4.0000 mg | Freq: Four times a day (QID) | INTRAMUSCULAR | Status: DC | PRN

## 2021-01-28 MED ORDER — HYDRALAZINE HCL 20 MG/ML IJ SOLN: 10.0000 mg | INTRAMUSCULAR | Status: AC | PRN

## 2021-01-28 MED ORDER — PERFLUTREN LIPID MICROSPHERE
1.0000 mL | INTRAVENOUS | Status: AC | PRN
Start: 2021-01-28 — End: 2021-01-28
  Administered 2021-01-28: 14:00:00 2 mL via INTRAVENOUS
  Filled 2021-01-28: qty 10

## 2021-01-28 MED ORDER — ASPIRIN 81 MG PO CHEW: 81.0000 mg | CHEWABLE_TABLET | Freq: Every day | ORAL | Status: DC

## 2021-01-28 MED ORDER — ASPIRIN EC 81 MG PO TBEC
81.0000 mg | DELAYED_RELEASE_TABLET | Freq: Every day | ORAL | Status: DC
  Administered 2021-01-29: 81 mg via ORAL
  Filled 2021-01-28: qty 1

## 2021-01-28 MED ORDER — SODIUM CHLORIDE 0.9 % IV SOLN: 250.0000 mL | INTRAVENOUS | Status: DC | PRN

## 2021-01-28 MED ORDER — ACETAMINOPHEN 325 MG PO TABS: 650.0000 mg | ORAL_TABLET | ORAL | Status: DC | PRN

## 2021-01-28 MED ORDER — HEPARIN (PORCINE) IN NACL 1000-0.9 UT/500ML-% IV SOLN
INTRAVENOUS | Status: DC | PRN
  Administered 2021-01-28 (×2): 500 mL

## 2021-01-28 MED ORDER — CLOPIDOGREL BISULFATE 75 MG PO TABS
75.0000 mg | ORAL_TABLET | Freq: Every day | ORAL | Status: DC
  Administered 2021-01-29: 75 mg via ORAL
  Filled 2021-01-28 (×2): qty 1

## 2021-01-28 SURGICAL SUPPLY — 16 items
CATH INFINITI 5FR ANG PIGTAIL (CATHETERS) ×2 IMPLANT
CATH OPTITORQUE TIG 4.0 5F (CATHETERS) ×2 IMPLANT
CATH VISTA GUIDE 6FR XB3.5 (CATHETERS) ×2 IMPLANT
DEVICE RAD COMP TR BAND LRG (VASCULAR PRODUCTS) ×2 IMPLANT
GLIDESHEATH SLEND A-KIT 6F 22G (SHEATH) ×2 IMPLANT
GUIDEWIRE INQWIRE 1.5J.035X260 (WIRE) ×1 IMPLANT
GUIDEWIRE PRESSURE X 175 (WIRE) ×2 IMPLANT
INQWIRE 1.5J .035X260CM (WIRE) ×2
KIT ENCORE 26 ADVANTAGE (KITS) ×2 IMPLANT
KIT HEART LEFT (KITS) ×2 IMPLANT
PACK CARDIAC CATHETERIZATION (CUSTOM PROCEDURE TRAY) ×2 IMPLANT
SYR MEDRAD MARK 7 150ML (SYRINGE) ×2 IMPLANT
TRANSDUCER W/STOPCOCK (MISCELLANEOUS) ×2 IMPLANT
TUBING CIL FLEX 10 FLL-RA (TUBING) ×2 IMPLANT
WIRE ASAHI PROWATER 180CM (WIRE) ×2 IMPLANT
WIRE HI TORQ VERSACORE-J 145CM (WIRE) ×2 IMPLANT

## 2021-01-28 NOTE — Progress Notes (Signed)
Limited 2D echocardiogram with [facility] completed.  01/28/2021 2:07 PM Kelby Aline., MHA, RVT, RDCS, RDMS

## 2021-01-28 NOTE — Progress Notes (Addendum)
Pt refused metoprolol and losartan. Wishes to speak to MD. Pt has also not been compliant with bedrest, so deflation of TR band 2 hrs post cath has no commenced. This RN concerned he has not clotted appropriately.  Pt removed leads and ambulated to the bathroom without notifying staff. Pt educated again on the importance of cardiac monitoring and bedrest.   Irish Lack, MD notified.

## 2021-01-28 NOTE — Progress Notes (Signed)
   Spoke to patient more after his cath.  We will switch his HCTZ to Lasix, per his preference.  He is agreeable to the losartan as I told him it would help strengthen his heart. I stressed the importance of keep his right wrist straight.  He is hoping to go home tomorrow.   Jettie Booze, MD

## 2021-01-28 NOTE — H&P (View-Only) (Signed)
Progress Note  Patient Name: Clinton Johnson Date of Encounter: 01/28/2021  [facility] Cardiologist: Donato Heinz, MD   Subjective   No chest pain at this time.  Inpatient Medications    Scheduled Meds:  aspirin EC  81 mg Oral Daily   atorvastatin  80 mg Oral Daily   losartan  25 mg Oral Daily   metoprolol tartrate  25 mg Oral BID   nicotine  14 mg Transdermal Daily   nitroGLYCERIN  1 inch Topical Q6H   potassium chloride  40 mEq Oral Once   sodium chloride flush  3 mL Intravenous Q12H   Continuous Infusions:  sodium chloride     sodium chloride     sodium chloride 1 mL/kg/hr (01/28/21 0115)   heparin Stopped (01/28/21 0828)   PRN Meds: sodium chloride, acetaminophen, ALPRAZolam, nitroGLYCERIN, ondansetron (ZOFRAN) IV, sodium chloride flush   Vital Signs    Vitals:   01/27/21 1903 01/27/21 2357 01/28/21 0323 01/28/21 0830  BP: 118/77 112/85 124/84 (!) 142/97  Pulse: 88 79 71 77  Resp: 15 17 14 17   Temp: 97.8 F (36.6 C) 98 F (36.7 C) 98 F (36.7 C) 98 F (36.7 C)  TempSrc: Oral Oral Oral Oral  SpO2: 94% 96% 92%   Weight:      Height:        Intake/Output Summary (Last 24 hours) at 01/28/2021 0833 Last data filed at 01/28/2021 0115 Gross per 24 hour  Intake 906.16 ml  Output --  Net 906.16 ml   Last 3 Weights 01/26/2021 01/26/2021 06/26/2020  Weight (lbs) 279 lb 5.2 oz 285 lb 285 lb 15 oz  Weight (kg) 126.7 kg 129.275 kg 129.7 kg      Telemetry    Normal sinus rhythm- Personally Reviewed  ECG    Normal sinus rhythm, septal Q waves, lateral ST depressions- Personally Reviewed  Physical Exam   GEN: No acute distress.   Neck: No JVD Cardiac: RRR, no murmurs, rubs, or gallops.  Respiratory: Clear to auscultation bilaterally. GI: Soft, nontender, non-distended , obese MS: No edema; No deformity. Neuro:  Nonfocal  Psych: Normal affect   Labs    High Sensitivity Troponin:   Recent Labs  Lab 01/26/21 0124 01/26/21 0320  01/26/21 1356 01/27/21 0531  TROPONINIHS 1,043* 821* 1,642* 2,948*     Chemistry Recent Labs  Lab 01/26/21 1356 01/27/21 0053 01/28/21 0033  NA 139 136 139  K 3.3* 3.1* 3.4*  CL 98 99 105  CO2 30 27 26   GLUCOSE 121* 135* 131*  BUN 12 13 11   CREATININE 1.15 1.12 1.27*  CALCIUM 9.5 8.6* 8.7*  MG 2.1  --   --   PROT 8.2*  --   --   ALBUMIN 4.0  --   --   AST 30  --   --   ALT 15  --   --   ALKPHOS 76  --   --   BILITOT 0.9  --   --   GFRNONAA >60 >60 >60  ANIONGAP 11 10 8     Lipids  Recent Labs  Lab 01/27/21 0053  CHOL 238*  TRIG 257*  HDL 44  LDLCALC 143*  CHOLHDL 5.4    Hematology Recent Labs  Lab 01/26/21 0556 01/27/21 0053 01/28/21 0033  WBC 6.4 6.7 6.7  RBC 4.16* 4.81 4.66  HGB 11.7* 13.6 13.1  HCT 35.8* 41.6 41.0  MCV 86.1 86.5 88.0  MCH 28.1 28.3 28.1  MCHC 32.7 32.7 32.0  RDW 14.8 14.7 15.1  PLT 200 216 220   Thyroid  Recent Labs  Lab 01/26/21 1356  TSH 2.594    BNPNo results for input(s): BNP, PROBNP in the last 168 hours.  DDimer No results for input(s): DDIMER in the last 168 hours.   Radiology    ECHOCARDIOGRAM COMPLETE  Result Date: 01/27/2021    ECHOCARDIOGRAM REPORT   Patient Name:   Clinton Johnson Date of Exam: 01/27/2021 Medical Rec #:  [id number]     Height:       73.0 in Accession #:    [id number]    Weight:       279.3 lb Date of Birth:  16-Jul-1970      BSA:          2.480 m Patient Age:    43 years      BP:           115/92 mmHg Patient Gender: M             HR:           78 bpm. Exam Location:  [facility] Procedure: 2D Echo, Cardiac Doppler and Color Doppler Indications:    NSTEMI I21.4  History:        Patient has no prior history of Echocardiogram examinations.                 CHF, CAD; Risk Factors:Hypertension and Current Smoker.  Sonographer:    Alvino Chapel RCS Referring Phys: [place]  1. Akinsis of the apical septum, apical inferior, apical anterior, and apex. Findings concerning for LAD infarction.  There is suggestion of possible LV thrombus in the apical 4 chamber views but this is not definitive. Woud recommend a limited contrast study for better characterization. Left ventricular ejection fraction, by estimation, is 35 to 40%. The left ventricle has moderately decreased function. The left ventricle demonstrates regional wall motion abnormalities (see scoring diagram/findings for  description). There is mild concentric left ventricular hypertrophy. Left ventricular diastolic parameters are consistent with Grade II diastolic dysfunction (pseudonormalization).  2. Right ventricular systolic function is normal. The right ventricular size is normal. Tricuspid regurgitation signal is inadequate for assessing PA pressure.  3. The mitral valve is grossly normal. Trivial mitral valve regurgitation. No evidence of mitral stenosis.  4. The aortic valve is tricuspid. Aortic valve regurgitation is not visualized. No aortic stenosis is present.  5. The inferior vena cava is dilated in size with >50% respiratory variability, suggesting right atrial pressure of 8 mmHg. FINDINGS  Left Ventricle: Akinsis of the apical septum, apical inferior, apical anterior, and apex. Findings concerning for LAD infarction. There is suggestion of possible LV thrombus in the apical 4 chamber views but this is not definitive. Woud recommend a limited contrast study for better characterization. Left ventricular ejection fraction, by estimation, is 35 to 40%. The left ventricle has moderately decreased function. The left ventricle demonstrates regional wall motion abnormalities. The left ventricular internal cavity size was normal in size. There is mild concentric left ventricular hypertrophy. Left ventricular diastolic parameters are consistent with Grade II diastolic dysfunction (pseudonormalization).  LV Wall Scoring: The apical septal segment, apical anterior segment, apical inferior segment, and apex are akinetic. Right Ventricle: The  right ventricular size is normal. No increase in right ventricular wall thickness. Right ventricular systolic function is normal. Tricuspid regurgitation signal is inadequate for assessing PA pressure. Left Atrium: Left atrial size was normal in size. Right Atrium: Right atrial  size was normal in size. Pericardium: Trivial pericardial effusion is present. Mitral Valve: The mitral valve is grossly normal. Trivial mitral valve regurgitation. No evidence of mitral valve stenosis. Tricuspid Valve: The tricuspid valve is grossly normal. Tricuspid valve regurgitation is trivial. No evidence of tricuspid stenosis. Aortic Valve: The aortic valve is tricuspid. Aortic valve regurgitation is not visualized. No aortic stenosis is present. Pulmonic Valve: The pulmonic valve was grossly normal. Pulmonic valve regurgitation is not visualized. No evidence of pulmonic stenosis. Aorta: The aortic root is normal in size and structure. Venous: The inferior vena cava is dilated in size with greater than 50% respiratory variability, suggesting right atrial pressure of 8 mmHg. IAS/Shunts: The atrial septum is grossly normal.  LEFT VENTRICLE PLAX 2D LVIDd:         5.30 cm      Diastology LVIDs:         3.70 cm      LV e' medial:    6.45 cm/s LV PW:         1.30 cm      LV E/e' medial:  11.8 LV IVS:        1.40 cm      LV e' lateral:   6.38 cm/s LVOT diam:     2.20 cm      LV E/e' lateral: 11.9 LV SV:         59 LV SV Index:   24 LVOT Area:     3.80 cm  LV Volumes (MOD) LV vol d, MOD A2C: 221.0 ml LV vol d, MOD A4C: 247.0 ml LV vol s, MOD A2C: 121.0 ml LV vol s, MOD A4C: 120.0 ml LV SV MOD A2C:     100.0 ml LV SV MOD A4C:     247.0 ml LV SV MOD BP:      116.7 ml RIGHT VENTRICLE RV S prime:     12.70 cm/s TAPSE (M-mode): 2.0 cm LEFT ATRIUM              Index        RIGHT ATRIUM           Index LA diam:        4.20 cm  1.69 cm/m   RA Area:     16.10 cm LA Vol (A2C):   95.0 ml  38.31 ml/m  RA Volume:   41.40 ml  16.69 ml/m LA Vol (A4C):    99.3 ml  40.04 ml/m LA Biplane Vol: 102.0 ml 41.13 ml/m  AORTIC VALVE LVOT Vmax:   89.60 cm/s LVOT Vmean:  69.800 cm/s LVOT VTI:    0.156 m  AORTA Ao Root diam: 3.70 cm MITRAL VALVE MV Area (PHT): 3.46 cm    SHUNTS MV Decel Time: 219 msec    Systemic VTI:  0.16 m MV E velocity: 76.20 cm/s  Systemic Diam: 2.20 cm MV A velocity: 68.00 cm/s MV E/A ratio:  1.12 Eleonore Chiquito MD Electronically signed by Eleonore Chiquito MD Signature Date/Time: 01/27/2021/4:51:40 PM    Final     Cardiac Studies   Cath pending Echo reveals:Akinsis of the apical septum, apical inferior, apical anterior, and  apex. Findings concerning for LAD infarction. There is suggestion of  possible LV thrombus in the apical 4 chamber views but this is not  definitive. Woud recommend a limited contrast  study for better characterization. Left ventricular ejection fraction, by  estimation, is 35 to 40%. The left ventricle has moderately decreased  function. The left  ventricle demonstrates regional wall motion  abnormalities (see scoring diagram/findings for   description). There is mild concentric left ventricular hypertrophy. Left  ventricular diastolic parameters are consistent with Grade II diastolic  dysfunction (pseudonormalization).   Patient Profile     50 y.o. male with prior MI and PCI while in [place]    Coronary artery disease: I had a long talk with the patient as there was concern about compliance with medications.  He told me that he saw Dr. When he was feeling bad after his MI and that doctor told him that it was the blood thinners that was making him feel bad.  He stopped his blood thinners at that point and started taking only a baby aspirin.  I do not know whether this was anticoagulation for an apical thrombus or an antiplatelet medication.  I did explain to him that he would need dual antiplatelet therapy ideally for at least 6 months if he did require stent.  If he did not take this  medication, he ran the risk of stent thrombosis which carry to 40% mortality risk.  Once I told him this, he seemed more interested in taking the medications.  Given the concern of apical thrombus, may have to reconsider Coumadin.  I suspect his compliance with Coumadin may be suboptimal.  After cath, we will plan for the limited echocardiogram to evaluate for apical thrombus.  Hyperlipidemia: LDL 143, triglycerides 257.  High-dose atorvastatin has been initiated.  Chronic systolic heart failure: He will benefit from heart failure therapy.  Could consider Entresto.  Currently on losartan.  He has declined metoprolol due to fear of side effects.  Compliance will be an issue for him.  If he does get PCI, I am inclined to focus on the importance of dual antiplatelet therapy and then ramping up heart failure therapy at a later time.  My concern is that if he has too many medicines started, he may stop them all as he did in the past and increased risk for stent thrombosis.  Long-term, he will need aggressive risk factor modification, weight loss or morbid obesity, healthy diet.  Avoid tobacco.   All questions about cardiac catheterization were answered.  For questions or updates, please contact [place] Please consult [email or web address] for contact info under        Signed, Larae Grooms, MD  01/28/2021, 8:33 AM

## 2021-01-28 NOTE — Interval H&P Note (Signed)
Cath Lab Visit (complete for each Cath Lab visit)  Clinical Evaluation Leading to the Procedure:   ACS: Yes.    Non-ACS:    Anginal Classification: CCS II  Anti-ischemic medical therapy: No Therapy  Non-Invasive Test Results: No non-invasive testing performed  Prior CABG: No previous CABG      History and Physical Interval Note:  01/28/2021 8:54 AM  Clinton Johnson  has presented today for surgery, with the diagnosis of NSTEMI.  The various methods of treatment have been discussed with the patient and family. After consideration of risks, benefits and other options for treatment, the patient has consented to  Procedure(s): LEFT HEART CATH AND CORONARY ANGIOGRAPHY (N/A) as a surgical intervention.  The patient's history has been reviewed, patient examined, no change in status, stable for surgery.  I have reviewed the patient's chart and labs.  Questions were answered to the patient's satisfaction.     Quay Burow

## 2021-01-28 NOTE — Progress Notes (Signed)
ANTICOAGULATION CONSULT NOTE  Pharmacy Consult for heparin Indication: chest pain/ACS  No Known Allergies  Patient Measurements: Height: 6\' 1"  (185.4 cm) Weight: (S) 126.7 kg (279 lb 5.2 oz) IBW/kg (Calculated) : 79.9 Heparin Dosing Weight: 108.7 kg  Vital Signs: Temp: 98 F (36.7 C) (11/28 0830) Temp Source: Oral (11/28 0830) BP: 142/97 (11/28 0830) Pulse Rate: 77 (11/28 0830)  Labs: Recent Labs    01/26/21 0320 01/26/21 0556 01/26/21 1155 01/26/21 1356 01/26/21 1844 01/27/21 0053 01/27/21 0531 01/27/21 0908 01/27/21 1626 01/28/21 0033  HGB  --  11.7*  --   --   --  13.6  --   --   --  13.1  HCT  --  35.8*  --   --   --  41.6  --   --   --  41.0  PLT  --  200  --   --   --  216  --   --   --  220  HEPARINUNFRC  --   --    < >  --    < > 0.19*  --  0.43 0.38 0.39  CREATININE  --   --   --  1.15  --  1.12  --   --   --  1.27*  TROPONINIHS 821*  --   --  1,642*  --   --  2,948*  --   --   --    < > = values in this interval not displayed.     Estimated Creatinine Clearance: 94.9 mL/min (A) (by C-G formula based on SCr of 1.27 mg/dL (H)).   Assessment: 20 yoM admitted with NSTEMI and started on IV heparin. Heparin level therapeutic, CBC stable, no S/Sx bleeding documented. Cath planned today. ECHO noted to have possible LV thrombus.  Goal of Therapy:  Heparin level 0.3 - 0.7 units/mL Monitor platelets by anticoagulation protocol: Yes   Plan:  Continue heparin gtt at 2050 units/hr Daily heparin level and CBC  Arrie Senate, PharmD, [place], [facility] Clinical Pharmacist [phone] Please check AMION for all [place] numbers 01/28/2021

## 2021-01-28 NOTE — Progress Notes (Signed)
Pt again ambulated to the bathroom without notifying staff. Pt removed all leads and is now in the bed, fully dressed.   This RN just started TR band deflation, as pt is waving arm around and refusing bedrest and other bleeding precautions.

## 2021-01-28 NOTE — Progress Notes (Addendum)
Progress Note  Patient Name: Caden Fukushima Date of Encounter: 01/28/2021  [facility] Cardiologist: Donato Heinz, MD   Subjective   No chest pain at this time.  Inpatient Medications    Scheduled Meds:  aspirin EC  81 mg Oral Daily   atorvastatin  80 mg Oral Daily   losartan  25 mg Oral Daily   metoprolol tartrate  25 mg Oral BID   nicotine  14 mg Transdermal Daily   nitroGLYCERIN  1 inch Topical Q6H   potassium chloride  40 mEq Oral Once   sodium chloride flush  3 mL Intravenous Q12H   Continuous Infusions:  sodium chloride     sodium chloride     sodium chloride 1 mL/kg/hr (01/28/21 0115)   heparin Stopped (01/28/21 0828)   PRN Meds: sodium chloride, acetaminophen, ALPRAZolam, nitroGLYCERIN, ondansetron (ZOFRAN) IV, sodium chloride flush   Vital Signs    Vitals:   01/27/21 1903 01/27/21 2357 01/28/21 0323 01/28/21 0830  BP: 118/77 112/85 124/84 (!) 142/97  Pulse: 88 79 71 77  Resp: 15 17 14 17   Temp: 97.8 F (36.6 C) 98 F (36.7 C) 98 F (36.7 C) 98 F (36.7 C)  TempSrc: Oral Oral Oral Oral  SpO2: 94% 96% 92%   Weight:      Height:        Intake/Output Summary (Last 24 hours) at 01/28/2021 0833 Last data filed at 01/28/2021 0115 Gross per 24 hour  Intake 906.16 ml  Output --  Net 906.16 ml   Last 3 Weights 01/26/2021 01/26/2021 06/26/2020  Weight (lbs) 279 lb 5.2 oz 285 lb 285 lb 15 oz  Weight (kg) 126.7 kg 129.275 kg 129.7 kg      Telemetry    Normal sinus rhythm- Personally Reviewed  ECG    Normal sinus rhythm, septal Q waves, lateral ST depressions- Personally Reviewed  Physical Exam   GEN: No acute distress.   Neck: No JVD Cardiac: RRR, no murmurs, rubs, or gallops.  Respiratory: Clear to auscultation bilaterally. GI: Soft, nontender, non-distended , obese MS: No edema; No deformity. Neuro:  Nonfocal  Psych: Normal affect   Labs    High Sensitivity Troponin:   Recent Labs  Lab 01/26/21 0124 01/26/21 0320  01/26/21 1356 01/27/21 0531  TROPONINIHS 1,043* 821* 1,642* 2,948*     Chemistry Recent Labs  Lab 01/26/21 1356 01/27/21 0053 01/28/21 0033  NA 139 136 139  K 3.3* 3.1* 3.4*  CL 98 99 105  CO2 30 27 26   GLUCOSE 121* 135* 131*  BUN 12 13 11   CREATININE 1.15 1.12 1.27*  CALCIUM 9.5 8.6* 8.7*  MG 2.1  --   --   PROT 8.2*  --   --   ALBUMIN 4.0  --   --   AST 30  --   --   ALT 15  --   --   ALKPHOS 76  --   --   BILITOT 0.9  --   --   GFRNONAA >60 >60 >60  ANIONGAP 11 10 8     Lipids  Recent Labs  Lab 01/27/21 0053  CHOL 238*  TRIG 257*  HDL 44  LDLCALC 143*  CHOLHDL 5.4    Hematology Recent Labs  Lab 01/26/21 0556 01/27/21 0053 01/28/21 0033  WBC 6.4 6.7 6.7  RBC 4.16* 4.81 4.66  HGB 11.7* 13.6 13.1  HCT 35.8* 41.6 41.0  MCV 86.1 86.5 88.0  MCH 28.1 28.3 28.1  MCHC 32.7 32.7 32.0  RDW 14.8 14.7 15.1  PLT 200 216 220   Thyroid  Recent Labs  Lab 01/26/21 1356  TSH 2.594    BNPNo results for input(s): BNP, PROBNP in the last 168 hours.  DDimer No results for input(s): DDIMER in the last 168 hours.   Radiology    ECHOCARDIOGRAM COMPLETE  Result Date: 01/27/2021    ECHOCARDIOGRAM REPORT   Patient Name:   DEWARD SEBEK Date of Exam: 01/27/2021 Medical Rec #:  [id number]     Height:       73.0 in Accession #:    [id number]    Weight:       279.3 lb Date of Birth:  1970/12/06      BSA:          2.480 m Patient Age:    54 years      BP:           115/92 mmHg Patient Gender: M             HR:           78 bpm. Exam Location:  [facility] Procedure: 2D Echo, Cardiac Doppler and Color Doppler Indications:    NSTEMI I21.4  History:        Patient has no prior history of Echocardiogram examinations.                 CHF, CAD; Risk Factors:Hypertension and Current Smoker.  Sonographer:    Alvino Chapel RCS Referring Phys: Virgil  1. Akinsis of the apical septum, apical inferior, apical anterior, and apex. Findings concerning for LAD infarction.  There is suggestion of possible LV thrombus in the apical 4 chamber views but this is not definitive. Woud recommend a limited contrast study for better characterization. Left ventricular ejection fraction, by estimation, is 35 to 40%. The left ventricle has moderately decreased function. The left ventricle demonstrates regional wall motion abnormalities (see scoring diagram/findings for  description). There is mild concentric left ventricular hypertrophy. Left ventricular diastolic parameters are consistent with Grade II diastolic dysfunction (pseudonormalization).  2. Right ventricular systolic function is normal. The right ventricular size is normal. Tricuspid regurgitation signal is inadequate for assessing PA pressure.  3. The mitral valve is grossly normal. Trivial mitral valve regurgitation. No evidence of mitral stenosis.  4. The aortic valve is tricuspid. Aortic valve regurgitation is not visualized. No aortic stenosis is present.  5. The inferior vena cava is dilated in size with >50% respiratory variability, suggesting right atrial pressure of 8 mmHg. FINDINGS  Left Ventricle: Akinsis of the apical septum, apical inferior, apical anterior, and apex. Findings concerning for LAD infarction. There is suggestion of possible LV thrombus in the apical 4 chamber views but this is not definitive. Woud recommend a limited contrast study for better characterization. Left ventricular ejection fraction, by estimation, is 35 to 40%. The left ventricle has moderately decreased function. The left ventricle demonstrates regional wall motion abnormalities. The left ventricular internal cavity size was normal in size. There is mild concentric left ventricular hypertrophy. Left ventricular diastolic parameters are consistent with Grade II diastolic dysfunction (pseudonormalization).  LV Wall Scoring: The apical septal segment, apical anterior segment, apical inferior segment, and apex are akinetic. Right Ventricle: The  right ventricular size is normal. No increase in right ventricular wall thickness. Right ventricular systolic function is normal. Tricuspid regurgitation signal is inadequate for assessing PA pressure. Left Atrium: Left atrial size was normal in size. Right Atrium: Right atrial  size was normal in size. Pericardium: Trivial pericardial effusion is present. Mitral Valve: The mitral valve is grossly normal. Trivial mitral valve regurgitation. No evidence of mitral valve stenosis. Tricuspid Valve: The tricuspid valve is grossly normal. Tricuspid valve regurgitation is trivial. No evidence of tricuspid stenosis. Aortic Valve: The aortic valve is tricuspid. Aortic valve regurgitation is not visualized. No aortic stenosis is present. Pulmonic Valve: The pulmonic valve was grossly normal. Pulmonic valve regurgitation is not visualized. No evidence of pulmonic stenosis. Aorta: The aortic root is normal in size and structure. Venous: The inferior vena cava is dilated in size with greater than 50% respiratory variability, suggesting right atrial pressure of 8 mmHg. IAS/Shunts: The atrial septum is grossly normal.  LEFT VENTRICLE PLAX 2D LVIDd:         5.30 cm      Diastology LVIDs:         3.70 cm      LV e' medial:    6.45 cm/s LV PW:         1.30 cm      LV E/e' medial:  11.8 LV IVS:        1.40 cm      LV e' lateral:   6.38 cm/s LVOT diam:     2.20 cm      LV E/e' lateral: 11.9 LV SV:         59 LV SV Index:   24 LVOT Area:     3.80 cm  LV Volumes (MOD) LV vol d, MOD A2C: 221.0 ml LV vol d, MOD A4C: 247.0 ml LV vol s, MOD A2C: 121.0 ml LV vol s, MOD A4C: 120.0 ml LV SV MOD A2C:     100.0 ml LV SV MOD A4C:     247.0 ml LV SV MOD BP:      116.7 ml RIGHT VENTRICLE RV S prime:     12.70 cm/s TAPSE (M-mode): 2.0 cm LEFT ATRIUM              Index        RIGHT ATRIUM           Index LA diam:        4.20 cm  1.69 cm/m   RA Area:     16.10 cm LA Vol (A2C):   95.0 ml  38.31 ml/m  RA Volume:   41.40 ml  16.69 ml/m LA Vol (A4C):    99.3 ml  40.04 ml/m LA Biplane Vol: 102.0 ml 41.13 ml/m  AORTIC VALVE LVOT Vmax:   89.60 cm/s LVOT Vmean:  69.800 cm/s LVOT VTI:    0.156 m  AORTA Ao Root diam: 3.70 cm MITRAL VALVE MV Area (PHT): 3.46 cm    SHUNTS MV Decel Time: 219 msec    Systemic VTI:  0.16 m MV E velocity: 76.20 cm/s  Systemic Diam: 2.20 cm MV A velocity: 68.00 cm/s MV E/A ratio:  1.12 Eleonore Chiquito MD Electronically signed by Eleonore Chiquito MD Signature Date/Time: 01/27/2021/4:51:40 PM    Final     Cardiac Studies   Cath pending Echo reveals:Akinsis of the apical septum, apical inferior, apical anterior, and  apex. Findings concerning for LAD infarction. There is suggestion of  possible LV thrombus in the apical 4 chamber views but this is not  definitive. Woud recommend a limited contrast  study for better characterization. Left ventricular ejection fraction, by  estimation, is 35 to 40%. The left ventricle has moderately decreased  function. The left  ventricle demonstrates regional wall motion  abnormalities (see scoring diagram/findings for   description). There is mild concentric left ventricular hypertrophy. Left  ventricular diastolic parameters are consistent with Grade II diastolic  dysfunction (pseudonormalization).   Patient Profile     50 y.o. male with prior MI and PCI while in [place]    Coronary artery disease: I had a long talk with the patient as there was concern about compliance with medications.  He told me that he saw Dr. When he was feeling bad after his MI and that doctor told him that it was the blood thinners that was making him feel bad.  He stopped his blood thinners at that point and started taking only a baby aspirin.  I do not know whether this was anticoagulation for an apical thrombus or an antiplatelet medication.  I did explain to him that he would need dual antiplatelet therapy ideally for at least 6 months if he did require stent.  If he did not take this  medication, he ran the risk of stent thrombosis which carry to 40% mortality risk.  Once I told him this, he seemed more interested in taking the medications.  Given the concern of apical thrombus, may have to reconsider Coumadin.  I suspect his compliance with Coumadin may be suboptimal.  After cath, we will plan for the limited echocardiogram to evaluate for apical thrombus.  Hyperlipidemia: LDL 143, triglycerides 257.  High-dose atorvastatin has been initiated.  Chronic systolic heart failure: He will benefit from heart failure therapy.  Could consider Entresto.  Currently on losartan.  He has declined metoprolol due to fear of side effects.  Compliance will be an issue for him.  If he does get PCI, I am inclined to focus on the importance of dual antiplatelet therapy and then ramping up heart failure therapy at a later time.  My concern is that if he has too many medicines started, he may stop them all as he did in the past and increased risk for stent thrombosis.  Long-term, he will need aggressive risk factor modification, weight loss or morbid obesity, healthy diet.  Avoid tobacco.   All questions about cardiac catheterization were answered.  For questions or updates, please contact [place] Please consult [email or web address] for contact info under        Signed, Larae Grooms, MD  01/28/2021, 8:33 AM

## 2021-01-28 NOTE — Progress Notes (Addendum)
Rn returned to room to find patient had removed leads and had ambulated to the bathroom to have a BM. Pt adamant about walking and getting dressed. RN educated pt on the importance for leaving monitoring equipment on. Pt also educated that while he doesn't feel sick, he is in fact, very sick. Pt also educated again on the need to not move his R Rad.  Monitor programed for Vitals automated sequence, however Pt is non-compliant with leaving BP cuff, pulse Ox, and leads on.

## 2021-01-29 ENCOUNTER — Other Ambulatory Visit: Payer: Self-pay | Admitting: Cardiology

## 2021-01-29 ENCOUNTER — Other Ambulatory Visit (HOSPITAL_COMMUNITY): Payer: Self-pay

## 2021-01-29 DIAGNOSIS — I5043 Acute on chronic combined systolic (congestive) and diastolic (congestive) heart failure: Secondary | ICD-10-CM

## 2021-01-29 DIAGNOSIS — I5023 Acute on chronic systolic (congestive) heart failure: Secondary | ICD-10-CM

## 2021-01-29 DIAGNOSIS — I5020 Unspecified systolic (congestive) heart failure: Secondary | ICD-10-CM

## 2021-01-29 DIAGNOSIS — E785 Hyperlipidemia, unspecified: Secondary | ICD-10-CM

## 2021-01-29 DIAGNOSIS — Z79899 Other long term (current) drug therapy: Secondary | ICD-10-CM

## 2021-01-29 LAB — BASIC METABOLIC PANEL
Anion gap: 7 (ref 5–15)
BUN: 9 mg/dL (ref 6–20)
CO2: 25 mmol/L (ref 22–32)
Calcium: 8.8 mg/dL — ABNORMAL LOW (ref 8.9–10.3)
Chloride: 110 mmol/L (ref 98–111)
Creatinine, Ser: 1.25 mg/dL — ABNORMAL HIGH (ref 0.61–1.24)
GFR, Estimated: >60 mL/min (ref >60)
Glucose, Bld: 100 mg/dL — ABNORMAL HIGH (ref 70–99)
Potassium: 3.8 mmol/L (ref 3.5–5.1)
Sodium: 142 mmol/L (ref 135–145)

## 2021-01-29 LAB — CBC
HCT: 39.3 % (ref 39.0–52.0)
Hemoglobin: 12.7 g/dL — ABNORMAL LOW (ref 13.0–17.0)
MCH: 28 pg (ref 26.0–34.0)
MCHC: 32.3 g/dL (ref 30.0–36.0)
MCV: 86.8 fL (ref 80.0–100.0)
Platelets: 220 10*3/uL (ref 150–400)
RBC: 4.53 MIL/uL (ref 4.22–5.81)
RDW: 15.2 % (ref 11.5–15.5)
WBC: 5.9 10*3/uL (ref 4.0–10.5)
nRBC: 0 % (ref 0.0–0.2)

## 2021-01-29 MED ORDER — METOPROLOL TARTRATE 25 MG PO TABS
25.0000 mg | ORAL_TABLET | Freq: Two times a day (BID) | ORAL | 0 refills | Status: DC
  Filled 2021-01-29: qty 60, 30d supply, fill #0

## 2021-01-29 MED ORDER — FUROSEMIDE 40 MG PO TABS
40.0000 mg | ORAL_TABLET | Freq: Every day | ORAL | 1 refills | Status: DC
  Filled 2021-01-29: qty 30, 30d supply, fill #0

## 2021-01-29 MED ORDER — NICOTINE 14 MG/24HR TD PT24
14.0000 mg | MEDICATED_PATCH | Freq: Every day | TRANSDERMAL | 0 refills | Status: DC
  Filled 2021-01-29: qty 28, 28d supply, fill #0

## 2021-01-29 MED ORDER — LOSARTAN POTASSIUM 25 MG PO TABS
25.0000 mg | ORAL_TABLET | Freq: Every day | ORAL | 0 refills | Status: DC
  Filled 2021-01-29: qty 30, 30d supply, fill #0

## 2021-01-29 MED ORDER — ATORVASTATIN CALCIUM 80 MG PO TABS
80.0000 mg | ORAL_TABLET | Freq: Every day | ORAL | 1 refills | Status: DC
  Filled 2021-01-29: qty 30, 30d supply, fill #0

## 2021-01-29 MED ORDER — NITROGLYCERIN 0.4 MG SL SUBL
0.4000 mg | SUBLINGUAL_TABLET | SUBLINGUAL | 2 refills | Status: DC | PRN
  Filled 2021-01-29: qty 25, 7d supply, fill #0

## 2021-01-29 MED ORDER — POTASSIUM CHLORIDE CRYS ER 20 MEQ PO TBCR
20.0000 meq | EXTENDED_RELEASE_TABLET | Freq: Every day | ORAL | 1 refills | Status: DC
  Filled 2021-01-29: qty 30, 30d supply, fill #0

## 2021-01-29 MED ORDER — CLOPIDOGREL BISULFATE 75 MG PO TABS
75.0000 mg | ORAL_TABLET | Freq: Every day | ORAL | 2 refills | Status: DC
  Filled 2021-01-29: qty 90, 90d supply, fill #0

## 2021-01-29 NOTE — Plan of Care (Signed)
0700 - Report received from PM RN.  All questions answered.  Safety checks performed.  Hand hygiene performed before/after each pt contact. 0900 - Assessment/Rx.  MD advised he was putting in discharge orders. 1000 - Pt removed EKG leads. 1130 - Pt removed IV.  MD notified of pt's urgent request to be discharged.  MD writing D/C orders.  AVS reviewed with pt. 1215 - D/C orders written.  AVS printed.  Pt elected to walk to his daughter's car.  Pt able to ambulate without difficulty or distress.  Problem: Education: Goal: Knowledge of General Education information will improve Description: Including pain rating scale, medication(s)/side effects and non-pharmacologic comfort measures Outcome: Adequate for Discharge   Problem: Health Behavior/Discharge Planning: Goal: Ability to manage health-related needs will improve Outcome: Adequate for Discharge   Problem: Clinical Measurements: Goal: Ability to maintain clinical measurements within normal limits will improve Outcome: Adequate for Discharge Goal: Will remain free from infection Outcome: Adequate for Discharge Goal: Diagnostic test results will improve Outcome: Adequate for Discharge Goal: Respiratory complications will improve Outcome: Adequate for Discharge Goal: Cardiovascular complication will be avoided Outcome: Adequate for Discharge   Problem: Activity: Goal: Risk for activity intolerance will decrease Outcome: Adequate for Discharge   Problem: Nutrition: Goal: Adequate nutrition will be maintained Outcome: Adequate for Discharge   Problem: Coping: Goal: Level of anxiety will decrease Outcome: Adequate for Discharge   Problem: Elimination: Goal: Will not experience complications related to bowel motility Outcome: Adequate for Discharge Goal: Will not experience complications related to urinary retention Outcome: Adequate for Discharge   Problem: Pain Managment: Goal: General experience of comfort will  improve Outcome: Adequate for Discharge   Problem: Safety: Goal: Ability to remain free from injury will improve Outcome: Adequate for Discharge   Problem: Skin Integrity: Goal: Risk for impaired skin integrity will decrease Outcome: Adequate for Discharge

## 2021-01-29 NOTE — Discharge Summary (Addendum)
Discharge Summary    Patient ID: Clinton Johnson MRN: [id number]; DOB: 11/26/1970  Admit date: 01/26/2021 Discharge date: 01/29/2021  PCP:  Patient, No Pcp Per (Inactive)   [facility] Providers Cardiologist:  Donato Heinz, MD     Discharge Diagnoses    Principal Problem:   NSTEMI (non-ST elevated myocardial infarction) [facility]) Active Problems:   Tobacco use   Primary hypertension   Hypokalemia   HFrEF (heart failure with reduced ejection fraction) ([place])   Hyperlipidemia  Diagnostic Studies/Procedures    Echo: 01/27/21  IMPRESSIONS     1. Akinsis of the apical septum, apical inferior, apical anterior, and  apex. Findings concerning for LAD infarction. There is suggestion of  possible LV thrombus in the apical 4 chamber views but this is not  definitive. Woud recommend a limited contrast  study for better characterization. Left ventricular ejection fraction, by  estimation, is 35 to 40%. The left ventricle has moderately decreased  function. The left ventricle demonstrates regional wall motion  abnormalities (see scoring diagram/findings for   description). There is mild concentric left ventricular hypertrophy. Left  ventricular diastolic parameters are consistent with Grade II diastolic  dysfunction (pseudonormalization).   2. Right ventricular systolic function is normal. The right ventricular  size is normal. Tricuspid regurgitation signal is inadequate for assessing  PA pressure.   3. The mitral valve is grossly normal. Trivial mitral valve  regurgitation. No evidence of mitral stenosis.   4. The aortic valve is tricuspid. Aortic valve regurgitation is not  visualized. No aortic stenosis is present.   5. The inferior vena cava is dilated in size with >50% respiratory  variability, suggesting right atrial pressure of 8 mmHg.   FINDINGS   Left Ventricle: Akinsis of the apical septum, apical inferior, apical  anterior, and apex. Findings concerning for  LAD infarction. There is  suggestion of possible LV thrombus in the apical 4 chamber views but this  is not definitive. Woud recommend a  limited contrast study for better characterization. Left ventricular  ejection fraction, by estimation, is 35 to 40%. The left ventricle has  moderately decreased function. The left ventricle demonstrates regional  wall motion abnormalities. The left  ventricular internal cavity size was normal in size. There is mild  concentric left ventricular hypertrophy. Left ventricular diastolic  parameters are consistent with Grade II diastolic dysfunction  (pseudonormalization).      LV Wall Scoring:  The apical septal segment, apical anterior segment, apical inferior  segment,  and apex are akinetic.   Right Ventricle: The right ventricular size is normal. No increase in  right ventricular wall thickness. Right ventricular systolic function is  normal. Tricuspid regurgitation signal is inadequate for assessing PA  pressure.   Left Atrium: Left atrial size was normal in size.   Right Atrium: Right atrial size was normal in size.   Pericardium: Trivial pericardial effusion is present.   Mitral Valve: The mitral valve is grossly normal. Trivial mitral valve  regurgitation. No evidence of mitral valve stenosis.   Tricuspid Valve: The tricuspid valve is grossly normal. Tricuspid valve  regurgitation is trivial. No evidence of tricuspid stenosis.   Aortic Valve: The aortic valve is tricuspid. Aortic valve regurgitation is  not visualized. No aortic stenosis is present.   Pulmonic Valve: The pulmonic valve was grossly normal. Pulmonic valve  regurgitation is not visualized. No evidence of pulmonic stenosis.   Aorta: The aortic root is normal in size and structure.   Venous: The inferior vena cava  is dilated in size with greater than 50%  respiratory variability, suggesting right atrial pressure of 8 mmHg.   IAS/Shunts: The atrial septum is grossly  normal.   Echo: 01/28/21  IMPRESSIONS     1. Limited echo with Definity. No obvious thrombus at LV apex.   FINDINGS   Left Ventricle: Limited echo with Definity. No obvious thrombus at LV  apex. Definity contrast agent was given IV to delineate the left  ventricular endocardial borders.  Cath: 01/28/21     Ramus lesion is 70% stenosed.   Prox RCA lesion is 30% stenosed.   Prox RCA to Mid RCA lesion is 60% stenosed.   Previously placed Prox LAD to Mid LAD stent (unknown type) is  widely patent.   IMPRESSION: Clinton Johnson has a widely patent proximal LAD stent.  He did have an intermediate lesion in his proximal moderate-sized ramus branch which was demonstrated to be not physiologically significant by DFR (0.95).  He also has a intermediate mid RCA lesion that looks smooth and not significant.  The guidewire and catheter were removed.  The sheath was removed and a TR band was placed on the right wrist to achieve patent hemostasis.  The patient left the lab in stable condition.  He will need guideline directed optimal medical therapy for his moderately severe LV dysfunction in addition to risk factor modification including smoking cessation and treatment of his hyperlipidemia.   Quay Burow. MD, [facility]  Diagnostic Dominance: Right  _____________   History of Present Illness     Clinton Johnson is a 50 y.o. 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   Also HTN - he is on ASA but plavix has been stopped.  Pt unsure which coronary artery his stent was placed into.   No Diabetes or thyroid disease.  Not taking statin.    Reports 4 days before Thanksgiving he had some chest pressure that went away, then on Thanksgiving similar episode but again went away.  Then the day prior to admission pain came on and would not go away.  He went to ER and with IV heparin, asa, morphine pain resolved.  No associated symptoms or N,V,  diaphoresis or SOB.     He is a smoker a P[employer] and drinks beer and [employer] on weekends, never had DTs. No drugs. In ER he was on nipride from chart. But maybe not started.     EKG:  The ECG that was done 01/26/21 was personally reviewed and demonstrates SR with LBBB QRS 121 ms has inf lat ST depression but similar to EKG 05/16/20   2V CXR no active disease   Na 135, K+ 2.5, BUN 14, Cr 1.34,  Hgb 13.8 plts 226, WBC 6.9 Neg covid and influenza   BP 133/98 P 82 R 17  No chest pain. Mother may have died from MI.   Hospital Course     Non-STEMI: High-sensitivity troponin peaked at 2948.  Underwent cardiac cath noted above with widely patent proximal LAD stent.  Noted to have intermediate lesion proximal ramus branch which was not physiologically significant by DFR of 0.95.  Also with intermediate mid RCA lesion felt not to be significant.  Recommendations were to continue with medical therapy.  It was recommended that he remain on antiplatelet therapy, he was agreeable to continue on aspirin 81 mg daily.  HFrEF/Lower extremity edema: This was his  main complaint, he is a Programmer, systems.  -- Echo showed LVEF of 35 to 40% with akinesis of apical inferior/anterior walls and apex concerning for LAD infarct.  Normal RV, no significant valvular disease.  There was question of LV thrombus, repeat limited echo confirmed no thrombus noted.  -- Plan will be to discharge on Lasix 40 mg daily along with 20 mEq of potassium daily. -- Tolerated BB and ARB -- Check Bmet in 1 week  Hyperlipidemia: LDL 143 -- Atorvastatin 80 mg daily -- LFTs/FLP in 8 weeks  Tobacco use: Cessation advised -- Nicotine patch at discharge  Did the patient have an acute coronary syndrome (MI, NSTEMI, STEMI, etc) this admission?:  Yes                               AHA/ACC Clinical Performance & Quality Measures: Aspirin prescribed? - Yes ADP Receptor Inhibitor (Plavix/Clopidogrel, Brilinta/Ticagrelor or  Effient/Prasugrel) prescribed (includes medically managed patients)? - No - pt not agreeable to take Beta Blocker prescribed? - Yes High Intensity Statin (Lipitor 40-80mg  or Crestor 20-40mg ) prescribed? - Yes EF assessed during THIS hospitalization? - Yes For EF <40%, was ACEI/ARB prescribed? - Yes For EF <40%, Aldosterone Antagonist (Spironolactone or Eplerenone) prescribed? - No - Reason:  consider at outpatient appt Cardiac Rehab Phase II ordered (including medically managed patients)? - Yes       The patient will be scheduled for a TOC follow up appointment in 10-14 days.  A message has been sent to the [facility] and Scheduling Pool at the office where the patient should be seen for follow up.  _____________  Discharge Vitals Blood pressure (!) 148/112, pulse 84, temperature 98.1 F (36.7 C), temperature source Oral, resp. rate 15, height 6\' 1"  (1.854 m), weight (S) 126.7 kg, SpO2 97 %.  Filed Weights   01/26/21 0107 01/26/21 0223  Weight: 129.3 kg (S) 126.7 kg    Labs & Radiologic Studies    CBC Recent Labs    01/28/21 0033 01/29/21 0612  WBC 6.7 5.9  HGB 13.1 12.7*  HCT 41.0 39.3  MCV 88.0 86.8  PLT 220 937   Basic Metabolic Panel Recent Labs    01/26/21 1356 01/27/21 0053 01/28/21 0033 01/29/21 0612  NA 139   < > 139 142  K 3.3*   < > 3.4* 3.8  CL 98   < > 105 110  CO2 30   < > 26 25  GLUCOSE 121*   < > 131* 100*  BUN 12   < > 11 9  CREATININE 1.15   < > 1.27* 1.25*  CALCIUM 9.5   < > 8.7* 8.8*  MG 2.1  --   --   --    < > = values in this interval not displayed.   Liver Function Tests Recent Labs    01/26/21 1356  AST 30  ALT 15  ALKPHOS 76  BILITOT 0.9  PROT 8.2*  ALBUMIN 4.0   No results for input(s): LIPASE, AMYLASE in the last 72 hours. High Sensitivity Troponin:   Recent Labs  Lab 01/26/21 0124 01/26/21 0320 01/26/21 1356 01/27/21 0531  TROPONINIHS 1,043* 821* 1,642* 2,948*    BNP Invalid input(s): POCBNP D-Dimer No results for  input(s): DDIMER in the last 72 hours. Hemoglobin A1C Recent Labs    01/27/21 0053  HGBA1C 5.8*   Fasting Lipid Panel Recent Labs    01/27/21 0053  CHOL 238*  HDL 44  LDLCALC 143*  TRIG 257*  CHOLHDL 5.4   Thyroid Function Tests Recent Labs    01/26/21 1356  TSH 2.594   _____________  DG Chest 2 View  Result Date: 01/26/2021 CLINICAL DATA:  Intermittent chest pain x1 day. EXAM: CHEST - 2 VIEW COMPARISON:  May 07, 2020 FINDINGS: There is no evidence of acute infiltrate, pleural effusion or pneumothorax. The heart size and mediastinal contours are within normal limits. The visualized skeletal structures are unremarkable. IMPRESSION: No active cardiopulmonary disease. Electronically Signed   By: Virgina Norfolk M.D.   On: 01/26/2021 02:01   CARDIAC CATHETERIZATION  Result Date: 01/28/2021 Images from the original result were not included.   Ramus lesion is 70% stenosed.   Prox RCA lesion is 30% stenosed.   Prox RCA to Mid RCA lesion is 60% stenosed.   Previously placed Prox LAD to Mid LAD stent (unknown type) is  widely patent. Clinton Johnson is a 50 y.o. male  [id number] LOCATION:  FACILITY: [facility] PHYSICIAN: Quay Burow, M.D. 03-04-1970 DATE OF PROCEDURE:  01/28/2021 DATE OF DISCHARGE: CARDIAC CATHETERIZATION / DFR Ramus History obtained from chart review.  Clinton Johnson is a 50 year old moderately overweight African-American male admitted with non-STEMI.  He had prior stenting of his LAD in [place] approximately 2 years ago.  Other problems include hyperlipidemia and tobacco abuse.  His troponins were in the 2000 range.  He had nonspecific ST and T wave changes.  2D echo revealed an EF of 35% with question of LV mural thrombus.  He presents now for diagnostic coronary angiography. PROCEDURE DESCRIPTION: The patient was brought to the second floor [facility]iac cath lab in the postabsorptive state. He was premedicated with IV Versed and fentanyl. His right wrist  was prepped and shaved in usual sterile fashion. Xylocaine 1% was used for local anesthesia. A 6 French sheath was inserted into the right radial artery using standard Seldinger technique.  Ultrasound was used to identify the right radial artery and guide access.  A digital image was captured and placed the patient's chart.  A 5 [place] TIG catheter was used for selective coronary angiography and obtaining left heart pressures.  Isovue dye was used for the entirety of the case (100 cc total contrast administered to patient).  Retrograde aortic, ventricular apical pressures were recorded.  Radial cocktail was administered via the SideArm sheath.  The patient received 6000 units  of heparin intravenously. The proximal LAD stent was widely patent.  He did have an intermediate lesion in the ramus branch.  We decided to perform DFR to assess physiologic significance.  Patient received an additional 7000 units of heparin for a total of 13,000 units with an ACT of 283.  Isovue dye is used for the entirety of the TFR procedure.  Retrograde aortic pressures monitored during the case. Using a 6 [place] XB 3.5 cm guide catheter along with a Abbott DFR wire I crossed the proximal ramus branch lesion without difficulty demonstrating a DFR of 0.95 suggesting that the ramus branch lesion was not physiologically significant.   Clinton Johnson has a widely patent proximal LAD stent.  He did have an intermediate lesion in his proximal moderate-sized ramus branch which was demonstrated to be not physiologically significant by DFR (0.95).  He also has a intermediate mid RCA lesion that looks smooth and not significant.  The guidewire and catheter were removed.  The sheath was removed and a TR band was placed on  the right wrist to achieve patent hemostasis.  The patient left the lab in stable condition.  He will need guideline directed optimal medical therapy for his moderately severe LV dysfunction in addition to risk factor modification  including smoking cessation and treatment of his hyperlipidemia. Quay Burow. MD, [facility] 01/28/2021 9:51 AM    ECHOCARDIOGRAM COMPLETE  Result Date: 01/27/2021    ECHOCARDIOGRAM REPORT   Patient Name:   Clinton Johnson Date of Exam: 01/27/2021 Medical Rec #:  [id number]     Height:       73.0 in Accession #:    [id number]    Weight:       279.3 lb Date of Birth:  05-04-1970      BSA:          2.480 m Patient Age:    37 years      BP:           115/92 mmHg Patient Gender: M             HR:           78 bpm. Exam Location:  [facility] Procedure: 2D Echo, Cardiac Doppler and Color Doppler Indications:    NSTEMI I21.4  History:        Patient has no prior history of Echocardiogram examinations.                 CHF, CAD; Risk Factors:Hypertension and Current Smoker.  Sonographer:    Alvino Chapel RCS Referring Phys: [place]  1. Akinsis of the apical septum, apical inferior, apical anterior, and apex. Findings concerning for LAD infarction. There is suggestion of possible LV thrombus in the apical 4 chamber views but this is not definitive. Woud recommend a limited contrast study for better characterization. Left ventricular ejection fraction, by estimation, is 35 to 40%. The left ventricle has moderately decreased function. The left ventricle demonstrates regional wall motion abnormalities (see scoring diagram/findings for  description). There is mild concentric left ventricular hypertrophy. Left ventricular diastolic parameters are consistent with Grade II diastolic dysfunction (pseudonormalization).  2. Right ventricular systolic function is normal. The right ventricular size is normal. Tricuspid regurgitation signal is inadequate for assessing PA pressure.  3. The mitral valve is grossly normal. Trivial mitral valve regurgitation. No evidence of mitral stenosis.  4. The aortic valve is tricuspid. Aortic valve regurgitation is not visualized. No aortic stenosis is present.  5. The inferior vena  cava is dilated in size with >50% respiratory variability, suggesting right atrial pressure of 8 mmHg. FINDINGS  Left Ventricle: Akinsis of the apical septum, apical inferior, apical anterior, and apex. Findings concerning for LAD infarction. There is suggestion of possible LV thrombus in the apical 4 chamber views but this is not definitive. Woud recommend a limited contrast study for better characterization. Left ventricular ejection fraction, by estimation, is 35 to 40%. The left ventricle has moderately decreased function. The left ventricle demonstrates regional wall motion abnormalities. The left ventricular internal cavity size was normal in size. There is mild concentric left ventricular hypertrophy. Left ventricular diastolic parameters are consistent with Grade II diastolic dysfunction (pseudonormalization).  LV Wall Scoring: The apical septal segment, apical anterior segment, apical inferior segment, and apex are akinetic. Right Ventricle: The right ventricular size is normal. No increase in right ventricular wall thickness. Right ventricular systolic function is normal. Tricuspid regurgitation signal is inadequate for assessing PA pressure. Left Atrium: Left atrial size was normal in size. Right Atrium: Right  atrial size was normal in size. Pericardium: Trivial pericardial effusion is present. Mitral Valve: The mitral valve is grossly normal. Trivial mitral valve regurgitation. No evidence of mitral valve stenosis. Tricuspid Valve: The tricuspid valve is grossly normal. Tricuspid valve regurgitation is trivial. No evidence of tricuspid stenosis. Aortic Valve: The aortic valve is tricuspid. Aortic valve regurgitation is not visualized. No aortic stenosis is present. Pulmonic Valve: The pulmonic valve was grossly normal. Pulmonic valve regurgitation is not visualized. No evidence of pulmonic stenosis. Aorta: The aortic root is normal in size and structure. Venous: The inferior vena cava is dilated in size  with greater than 50% respiratory variability, suggesting right atrial pressure of 8 mmHg. IAS/Shunts: The atrial septum is grossly normal.  LEFT VENTRICLE PLAX 2D LVIDd:         5.30 cm      Diastology LVIDs:         3.70 cm      LV e' medial:    6.45 cm/s LV PW:         1.30 cm      LV E/e' medial:  11.8 LV IVS:        1.40 cm      LV e' lateral:   6.38 cm/s LVOT diam:     2.20 cm      LV E/e' lateral: 11.9 LV SV:         59 LV SV Index:   24 LVOT Area:     3.80 cm  LV Volumes (MOD) LV vol d, MOD A2C: 221.0 ml LV vol d, MOD A4C: 247.0 ml LV vol s, MOD A2C: 121.0 ml LV vol s, MOD A4C: 120.0 ml LV SV MOD A2C:     100.0 ml LV SV MOD A4C:     247.0 ml LV SV MOD BP:      116.7 ml RIGHT VENTRICLE RV S prime:     12.70 cm/s TAPSE (M-mode): 2.0 cm LEFT ATRIUM              Index        RIGHT ATRIUM           Index LA diam:        4.20 cm  1.69 cm/m   RA Area:     16.10 cm LA Vol (A2C):   95.0 ml  38.31 ml/m  RA Volume:   41.40 ml  16.69 ml/m LA Vol (A4C):   99.3 ml  40.04 ml/m LA Biplane Vol: 102.0 ml 41.13 ml/m  AORTIC VALVE LVOT Vmax:   89.60 cm/s LVOT Vmean:  69.800 cm/s LVOT VTI:    0.156 m  AORTA Ao Root diam: 3.70 cm MITRAL VALVE MV Area (PHT): 3.46 cm    SHUNTS MV Decel Time: 219 msec    Systemic VTI:  0.16 m MV E velocity: 76.20 cm/s  Systemic Diam: 2.20 cm MV A velocity: 68.00 cm/s MV E/A ratio:  1.12 Eleonore Chiquito MD Electronically signed by Eleonore Chiquito MD Signature Date/Time: 01/27/2021/4:51:40 PM    Final    ECHOCARDIOGRAM LIMITED  Result Date: 01/28/2021    ECHOCARDIOGRAM LIMITED REPORT   Patient Name:   Clinton Johnson Date of Exam: 01/28/2021 Medical Rec #:  [id number]     Height:       73.0 in Accession #:    [id number]    Weight:       279.3 lb Date of Birth:  Sep 20, 1970      BSA:  2.480 m Patient Age:    64 years      BP:           136/88 mmHg Patient Gender: M             HR:           81 bpm. Exam Location:  Inpatient Procedure: Limited Echo, Color Doppler and Intracardiac  Opacification Agent Indications:    Ischemic cardiomyopathy  History:        Patient has prior history of Echocardiogram examinations, most                 recent 01/27/2021. CHF; CAD.  Sonographer:    Maudry Mayhew MHA, RDMS, RVT, RDCS Referring Phys: [phone] [place]  1. Limited echo with Definity. No obvious thrombus at LV apex. FINDINGS  Left Ventricle: Limited echo with Definity. No obvious thrombus at LV apex. Definity contrast agent was given IV to delineate the left ventricular endocardial borders. Dorris Carnes MD Electronically signed by Dorris Carnes MD Signature Date/Time: 01/28/2021/6:03:11 PM    Final    Disposition   Pt is being discharged home today in good condition.  Follow-up Plans & Appointments     Follow-up Information     Loel Dubonnet, NP Follow up on 02/11/2021.   Specialty: Cardiology Why: at 10:55am for your follow up appt with Dr. Newman Nickels NP Donnetta Hail information: [place] [phone]         [facility] Follow up on 02/05/2021.   Specialty: Cardiology Why: Please come in for follow labs between 8:30-4pm Contact information: [address] [place] [phone]               Discharge Instructions     Amb Referral to Cardiac Rehabilitation   Complete by: As directed    To [place]   Diagnosis: NSTEMI   After initial evaluation and assessments completed: Virtual Based Care may be provided alone or in conjunction with Phase 2 Cardiac Rehab based on patient barriers.: Yes   Call MD for:  difficulty breathing, headache or visual disturbances   Complete by: As directed    Call MD for:  persistant dizziness or light-headedness   Complete by: As directed    Call MD for:  redness, tenderness, or signs of infection (pain, swelling, redness, odor or green/yellow discharge around incision site)   Complete by: As directed    Diet  - low sodium heart healthy   Complete by: As directed    Discharge instructions   Complete by: As directed    Radial Site Care Refer to this sheet in the next few weeks. These instructions provide you with information on caring for yourself after your procedure. Your caregiver may also give you more specific instructions. Your treatment has been planned according to current medical practices, but problems sometimes occur. Call your caregiver if you have any problems or questions after your procedure. HOME CARE INSTRUCTIONS You may shower the day after the procedure. Remove the bandage (dressing) and gently wash the site with plain soap and water. Gently pat the site dry.  Do not apply powder or lotion to the site.  Do not submerge the affected site in water for 3 to 5 days.  Inspect the site at least twice daily.  Do not flex or bend the affected arm for 24 hours.  No lifting over 5 pounds (2.3 kg) for 5 days after  your procedure.  Do not drive home if you are discharged the same day of the procedure. Have someone else drive you.  You may drive 24 hours after the procedure unless otherwise instructed by your caregiver.  What to expect: Any bruising will usually fade within 1 to 2 weeks.  Blood that collects in the tissue (hematoma) may be painful to the touch. It should usually decrease in size and tenderness within 1 to 2 weeks.  SEEK IMMEDIATE MEDICAL CARE IF: You have unusual pain at the radial site.  You have redness, warmth, swelling, or pain at the radial site.  You have drainage (other than a small amount of blood on the dressing).  You have chills.  You have a fever or persistent symptoms for more than 72 hours.  You have a fever and your symptoms suddenly get worse.  Your arm becomes pale, cool, tingly, or numb.  You have heavy bleeding from the site. Hold pressure on the site.   Increase activity slowly   Complete by: As directed        Discharge Medications   Allergies  as of 01/29/2021   No Known Allergies      Medication List     STOP taking these medications    hydrochlorothiazide 25 MG tablet Commonly known as: HYDRODIURIL   potassium chloride 10 MEQ tablet Commonly known as: KLOR-CON       TAKE these medications    aspirin 81 MG EC tablet Take 81 mg by mouth daily.   atorvastatin 80 MG tablet Commonly known as: LIPITOR Take 1 tablet (80 mg total) by mouth daily. Start taking on: January 30, 2021   furosemide 40 MG tablet Commonly known as: Lasix Take 1 tablet (40 mg total) by mouth daily.   losartan 25 MG tablet Commonly known as: COZAAR Take 1 tablet (25 mg total) by mouth daily. Start taking on: January 30, 2021   metoprolol tartrate 25 MG tablet Commonly known as: LOPRESSOR Take 1 tablet (25 mg total) by mouth 2 (two) times daily.   nicotine 14 mg/24hr patch Commonly known as: NICODERM CQ - dosed in mg/24 hours Place 1 patch (14 mg total) onto the skin daily. Start taking on: January 30, 2021   nitroGLYCERIN 0.4 MG SL tablet Commonly known as: NITROSTAT Place 1 tablet (0.4 mg total) under the tongue every 5 (five) minutes x 3 doses as needed for chest pain.   potassium chloride SA 20 MEQ tablet Commonly known as: KLOR-CON M Take 1 tablet (20 mEq total) by mouth daily.        Outstanding Labs/Studies   BMET in one week  FLP/LFTs in 8 weeks   Duration of Discharge Encounter   Greater than 30 minutes including physician time.  Signed, Reino Bellis, NP 01/29/2021, 12:08 PM  I have examined the patient and reviewed assessment and plan and discussed with patient.  Agree with above as stated.     NSTEMI: Medical therapy.  I stressed the importance of compliance with his medications.  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  Will discharge home on losartan.  This should also help with his low potassium levels.  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  He had Lasix in the past which he prefers as a diuretic.  We will give him Lasix 40 mg daily along with 20 mEq potassium daily.  He will need a  bmet in a week.  Compliance has been a long-term issue with him.  Continue baby aspirin given his coronary artery disease and prior stent.  No indication for long-term anticoagulation.  He stopped his antiplatelet therapy in the past due to "feeling bad" from the medicine.  He was only willing to take baby aspirin.   He will need healthy diet and regular exercise as well.   LDL well above target.  Sent home on high-dose atorvastatin for hyperlipidemia.  Will check labs in a few months.   He is agreeable to take the losartan, Lasix , atorvastatin, aspirin and potassium.  He agrees to come back for electrolytes in a week.  He is adament about leaving the hospital today.  I again stressed the importance of regular followup.  OK for discharge.  Larae Grooms

## 2021-01-29 NOTE — Progress Notes (Signed)
Patient refused  lab work this morning and said " the blood work isn't gonna keep him here and he is leaving at 7 in the morning".

## 2021-01-29 NOTE — Progress Notes (Deleted)
CARDIAC REHAB PHASE I   PRE:  Rate/Rhythm: 77 SR    BP: sitting 141/69    SaO2: 97 RA  MODE:  Ambulation: 290 ft   POST:  Rate/Rhythm: 84 SR    BP: sitting 129/69     SaO2: 100 RA  Pt needed verbal cues and mod assist to get out of bed. Slightly unsteady walking, min assist. Might do better in shoes. Initially felt well however after 150 ft pt c/o sudden weakness and SOB, requested to sit. Sat and rested 3 min. Able to stand and walk back to room however BP now lower. Encouraged being with staff when he gets up and taking orthostatics later. Left in recliner.  [place], ACSM 01/29/2021 11:46 AM

## 2021-01-29 NOTE — Progress Notes (Signed)
Progress Note  Patient Name: Javis Abboud Date of Encounter: 01/29/2021  [facility] Cardiologist: Donato Heinz, MD   Subjective   No thrombus noted on limited echo yesterday.  Inpatient Medications    Scheduled Meds:  aspirin EC  81 mg Oral Daily   atorvastatin  80 mg Oral Daily   clopidogrel  75 mg Oral Q breakfast   losartan  25 mg Oral Daily   metoprolol tartrate  25 mg Oral BID   nicotine  14 mg Transdermal Daily   nitroGLYCERIN  1 inch Topical Q6H   sodium chloride flush  3 mL Intravenous Q12H   sodium chloride flush  3 mL Intravenous Q12H   Continuous Infusions:  sodium chloride 10 mL/hr at 01/28/21 1141   sodium chloride     PRN Meds: sodium chloride, acetaminophen, ALPRAZolam, morphine injection, nitroGLYCERIN, ondansetron (ZOFRAN) IV, sodium chloride flush   Vital Signs    Vitals:   01/28/21 2341 01/29/21 0304 01/29/21 0755 01/29/21 0817  BP: (!) 129/98 (!) 128/97 (!) 175/104 (!) 148/112  Pulse:    84  Resp: (!) 23 16 (!) 24 15  Temp: 97.9 F (36.6 C) 97.7 F (36.5 C) 97.8 F (36.6 C) 98.1 F (36.7 C)  TempSrc: Oral Oral Oral Oral  SpO2:   97%   Weight:      Height:        Intake/Output Summary (Last 24 hours) at 01/29/2021 0850 Last data filed at 01/28/2021 2207 Gross per 24 hour  Intake 535.67 ml  Output --  Net 535.67 ml   Last 3 Weights 01/26/2021 01/26/2021 06/26/2020  Weight (lbs) 279 lb 5.2 oz 285 lb 285 lb 15 oz  Weight (kg) 126.7 kg 129.275 kg 129.7 kg      Telemetry    NSR - Personally Reviewed  ECG      Physical Exam   GEN: No acute distress.   Neck: No JVD Cardiac: RRR, no murmurs, rubs, or gallops.  Respiratory: Clear to auscultation bilaterally. GI: Soft, nontender, non-distended  MS: No edema; No deformity.No right radial hematoma, 2+ right radial pulse Neuro:  Nonfocal  Psych: Normal affect   Labs    High Sensitivity Troponin:   Recent Labs  Lab 01/26/21 0124 01/26/21 0320 01/26/21 1356  01/27/21 0531  TROPONINIHS 1,043* 821* 1,642* 2,948*     Chemistry Recent Labs  Lab 01/26/21 1356 01/27/21 0053 01/28/21 0033 01/29/21 0612  NA 139 136 139 142  K 3.3* 3.1* 3.4* 3.8  CL 98 99 105 110  CO2 30 27 26 25   GLUCOSE 121* 135* 131* 100*  BUN 12 13 11 9   CREATININE 1.15 1.12 1.27* 1.25*  CALCIUM 9.5 8.6* 8.7* 8.8*  MG 2.1  --   --   --   PROT 8.2*  --   --   --   ALBUMIN 4.0  --   --   --   AST 30  --   --   --   ALT 15  --   --   --   ALKPHOS 76  --   --   --   BILITOT 0.9  --   --   --   GFRNONAA >60 >60 >60 >60  ANIONGAP 11 10 8 7     Lipids  Recent Labs  Lab 01/27/21 0053  CHOL 238*  TRIG 257*  HDL 44  LDLCALC 143*  CHOLHDL 5.4    Hematology Recent Labs  Lab 01/27/21 0053 01/28/21 0033 01/29/21 2353  WBC 6.7 6.7 5.9  RBC 4.81 4.66 4.53  HGB 13.6 13.1 12.7*  HCT 41.6 41.0 39.3  MCV 86.5 88.0 86.8  MCH 28.3 28.1 28.0  MCHC 32.7 32.0 32.3  RDW 14.7 15.1 15.2  PLT 216 220 220   Thyroid  Recent Labs  Lab 01/26/21 1356  TSH 2.594    BNPNo results for input(s): BNP, PROBNP in the last 168 hours.  DDimer No results for input(s): DDIMER in the last 168 hours.   Radiology    CARDIAC CATHETERIZATION  Result Date: 01/28/2021 Images from the original result were not included.   Ramus lesion is 70% stenosed.   Prox RCA lesion is 30% stenosed.   Prox RCA to Mid RCA lesion is 60% stenosed.   Previously placed Prox LAD to Mid LAD stent (unknown type) is  widely patent. Yvon Mccord is a 50 y.o. male  [id number] LOCATION:  FACILITY: [facility] PHYSICIAN: Quay Burow, M.D. 1970-05-05 DATE OF PROCEDURE:  01/28/2021 DATE OF DISCHARGE: CARDIAC CATHETERIZATION / DFR Ramus History obtained from chart review.  Mr. Beyene is a 50 year old moderately overweight African-American male admitted with non-STEMI.  He had prior stenting of his LAD in [place] approximately 2 years ago.  Other problems include hyperlipidemia and tobacco abuse.  His  troponins were in the 2000 range.  He had nonspecific ST and T wave changes.  2D echo revealed an EF of 35% with question of LV mural thrombus.  He presents now for diagnostic coronary angiography. PROCEDURE DESCRIPTION: The patient was brought to the second floor [facility] cath lab in the postabsorptive state. He was premedicated with IV Versed and fentanyl. His right wrist was prepped and shaved in usual sterile fashion. Xylocaine 1% was used for local anesthesia. A 6 French sheath was inserted into the right radial artery using standard Seldinger technique.  Ultrasound was used to identify the right radial artery and guide access.  A digital image was captured and placed the patient's chart.  A 5 [place] TIG catheter was used for selective coronary angiography and obtaining left heart pressures.  Isovue dye was used for the entirety of the case (100 cc total contrast administered to patient).  Retrograde aortic, ventricular apical pressures were recorded.  Radial cocktail was administered via the SideArm sheath.  The patient received 6000 units  of heparin intravenously. The proximal LAD stent was widely patent.  He did have an intermediate lesion in the ramus branch.  We decided to perform DFR to assess physiologic significance.  Patient received an additional 7000 units of heparin for a total of 13,000 units with an ACT of 283.  Isovue dye is used for the entirety of the TFR procedure.  Retrograde aortic pressures monitored during the case. Using a 6 [place] XB 3.5 cm guide catheter along with a Abbott DFR wire I crossed the proximal ramus branch lesion without difficulty demonstrating a DFR of 0.95 suggesting that the ramus branch lesion was not physiologically significant.   Mr. Vanwingerden has a widely patent proximal LAD stent.  He did have an intermediate lesion in his proximal moderate-sized ramus branch which was demonstrated to be not physiologically significant by DFR (0.95).  He also has a  intermediate mid RCA lesion that looks smooth and not significant.  The guidewire and catheter were removed.  The sheath was removed and a TR band was placed on the right wrist to achieve patent hemostasis.  The patient left the lab in stable condition.  He  will need guideline directed optimal medical therapy for his moderately severe LV dysfunction in addition to risk factor modification including smoking cessation and treatment of his hyperlipidemia. Quay Burow. MD, [facility] 01/28/2021 9:51 AM    ECHOCARDIOGRAM COMPLETE  Result Date: 01/27/2021    ECHOCARDIOGRAM REPORT   Patient Name:   DAVARIOUS TUMBLESON Date of Exam: 01/27/2021 Medical Rec #:  [id number]     Height:       73.0 in Accession #:    [id number]    Weight:       279.3 lb Date of Birth:  1970/12/08      BSA:          2.480 m Patient Age:    50 years      BP:           115/92 mmHg Patient Gender: M             HR:           78 bpm. Exam Location:  [facility] Procedure: 2D Echo, Cardiac Doppler and Color Doppler Indications:    NSTEMI I21.4  History:        Patient has no prior history of Echocardiogram examinations.                 CHF, CAD; Risk Factors:Hypertension and Current Smoker.  Sonographer:    Alvino Chapel RCS Referring Phys: [facility]  1. Akinsis of the apical septum, apical inferior, apical anterior, and apex. Findings concerning for LAD infarction. There is suggestion of possible LV thrombus in the apical 4 chamber views but this is not definitive. Woud recommend a limited contrast study for better characterization. Left ventricular ejection fraction, by estimation, is 35 to 40%. The left ventricle has moderately decreased function. The left ventricle demonstrates regional wall motion abnormalities (see scoring diagram/findings for  description). There is mild concentric left ventricular hypertrophy. Left ventricular diastolic parameters are consistent with Grade II diastolic dysfunction (pseudonormalization).  2. Right  ventricular systolic function is normal. The right ventricular size is normal. Tricuspid regurgitation signal is inadequate for assessing PA pressure.  3. The mitral valve is grossly normal. Trivial mitral valve regurgitation. No evidence of mitral stenosis.  4. The aortic valve is tricuspid. Aortic valve regurgitation is not visualized. No aortic stenosis is present.  5. The inferior vena cava is dilated in size with >50% respiratory variability, suggesting right atrial pressure of 8 mmHg. FINDINGS  Left Ventricle: Akinsis of the apical septum, apical inferior, apical anterior, and apex. Findings concerning for LAD infarction. There is suggestion of possible LV thrombus in the apical 4 chamber views but this is not definitive. Woud recommend a limited contrast study for better characterization. Left ventricular ejection fraction, by estimation, is 35 to 40%. The left ventricle has moderately decreased function. The left ventricle demonstrates regional wall motion abnormalities. The left ventricular internal cavity size was normal in size. There is mild concentric left ventricular hypertrophy. Left ventricular diastolic parameters are consistent with Grade II diastolic dysfunction (pseudonormalization).  LV Wall Scoring: The apical septal segment, apical anterior segment, apical inferior segment, and apex are akinetic. Right Ventricle: The right ventricular size is normal. No increase in right ventricular wall thickness. Right ventricular systolic function is normal. Tricuspid regurgitation signal is inadequate for assessing PA pressure. Left Atrium: Left atrial size was normal in size. Right Atrium: Right atrial size was normal in size. Pericardium: Trivial pericardial effusion is present. Mitral Valve: The mitral valve is grossly  normal. Trivial mitral valve regurgitation. No evidence of mitral valve stenosis. Tricuspid Valve: The tricuspid valve is grossly normal. Tricuspid valve regurgitation is trivial. No  evidence of tricuspid stenosis. Aortic Valve: The aortic valve is tricuspid. Aortic valve regurgitation is not visualized. No aortic stenosis is present. Pulmonic Valve: The pulmonic valve was grossly normal. Pulmonic valve regurgitation is not visualized. No evidence of pulmonic stenosis. Aorta: The aortic root is normal in size and structure. Venous: The inferior vena cava is dilated in size with greater than 50% respiratory variability, suggesting right atrial pressure of 8 mmHg. IAS/Shunts: The atrial septum is grossly normal.  LEFT VENTRICLE PLAX 2D LVIDd:         5.30 cm      Diastology LVIDs:         3.70 cm      LV e' medial:    6.45 cm/s LV PW:         1.30 cm      LV E/e' medial:  11.8 LV IVS:        1.40 cm      LV e' lateral:   6.38 cm/s LVOT diam:     2.20 cm      LV E/e' lateral: 11.9 LV SV:         59 LV SV Index:   24 LVOT Area:     3.80 cm  LV Volumes (MOD) LV vol d, MOD A2C: 221.0 ml LV vol d, MOD A4C: 247.0 ml LV vol s, MOD A2C: 121.0 ml LV vol s, MOD A4C: 120.0 ml LV SV MOD A2C:     100.0 ml LV SV MOD A4C:     247.0 ml LV SV MOD BP:      116.7 ml RIGHT VENTRICLE RV S prime:     12.70 cm/s TAPSE (M-mode): 2.0 cm LEFT ATRIUM              Index        RIGHT ATRIUM           Index LA diam:        4.20 cm  1.69 cm/m   RA Area:     16.10 cm LA Vol (A2C):   95.0 ml  38.31 ml/m  RA Volume:   41.40 ml  16.69 ml/m LA Vol (A4C):   99.3 ml  40.04 ml/m LA Biplane Vol: 102.0 ml 41.13 ml/m  AORTIC VALVE LVOT Vmax:   89.60 cm/s LVOT Vmean:  69.800 cm/s LVOT VTI:    0.156 m  AORTA Ao Root diam: 3.70 cm MITRAL VALVE MV Area (PHT): 3.46 cm    SHUNTS MV Decel Time: 219 msec    Systemic VTI:  0.16 m MV E velocity: 76.20 cm/s  Systemic Diam: 2.20 cm MV A velocity: 68.00 cm/s MV E/A ratio:  1.12 Eleonore Chiquito MD Electronically signed by Eleonore Chiquito MD Signature Date/Time: 01/27/2021/4:51:40 PM    Final     Cardiac Studies   Echo images personally reviewed  Patient Profile     50 y.o. male elevated  troponin but no significant coronary stenosis requiring PCI  Assessment & Plan    NSTEMI: Medical therapy.  I stressed the importance of compliance with his medications.  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  Will discharge home on losartan.  This should also help with his low potassium levels.  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  He had Lasix in the past which he prefers as a diuretic.  We will give him Lasix 40 mg daily along with 20 mEq potassium daily.  He will need a bmet in a week.  Compliance has been a long-term issue with him.  Continue baby aspirin given his coronary artery disease and prior stent.  No indication for long-term anticoagulation.  He stopped his antiplatelet therapy in the past due to "feeling bad" from the medicine.  He was only willing to take baby aspirin.  He will need healthy diet and regular exercise as well.  LDL well above target.  Sent home on high-dose atorvastatin for hyperlipidemia.  Will check labs in a few months.  He is agreeable to take the losartan, Lasix , atorvastatin, aspirin and potassium.  He agrees to come back for electrolytes in a week. OK for discharge.  For questions or updates, please contact [place] Please consult [email or web address] for contact info under        Signed, Larae Grooms, MD  01/29/2021, 8:50 AM

## 2021-01-29 NOTE — Progress Notes (Signed)
Pt eager for d/c and declined ambulation. Discussed MI, smoking cessation, diet, exercise, NTG, and CRPII. Pt receptive and is seriously thinking about quitting smoking, working on diet, and exercise. 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 Encouraged pt to be serious about his meds. Appropriate questions.  Lynn CES, ACSM 7:52 AM 01/29/2021

## 2021-01-30 ENCOUNTER — Other Ambulatory Visit (HOSPITAL_COMMUNITY): Payer: Self-pay

## 2021-01-31 ENCOUNTER — Telehealth: Payer: Self-pay | Admitting: *Deleted

## 2021-01-31 DIAGNOSIS — Z006 Encounter for examination for normal comparison and control in clinical research program: Secondary | ICD-10-CM

## 2021-01-31 LAB — ALDOSTERONE + RENIN ACTIVITY W/ RATIO
ALDO / PRA Ratio: 3.8 (ref 0.0–30.0)
Aldosterone: 2.1 ng/dL (ref 0.0–30.0)
PRA LC/MS/MS: 0.56 ng/mL/hr (ref 0.167–5.380)

## 2021-01-31 NOTE — Telephone Encounter (Addendum)
V-Inception Research Study  Patient Contacted about potential participation in [employer] V-Inception.  Study was discussed with the patient and the opportunity to ask questions was given.  Patient was emailed or paper mailed a copy of the consent.  Patient will be contacted in 5-7 days for Follow-up phone call about information on the study.    This patient is eligible to participate in V-Inception Study.  The study is comparing the initiation of Inclisiran on top of usual care in Patient's with a recent acute coronary syndrome within the past 5 weeks.  Eligibility Criteria: recent ACS within 5 weeks, Serum LCL-C greater than or equal to 70mg /dL or non-HDL-C greater than or equal to 100mg /dL, and taking statin therapy or statin intolerant (not on PCSK9 Inhibitors).  Its a randomized, controlled, multicenter, open-label trial comparing a hospital post-discharge care pathway involving aggressive LCL-C management that includes Inclisiran with usual care versus usual care alone in patients with a recent ACS.   Jasmine Pang, RN BSN [facility] Cardiovascular Research & Education Direct Line: [phone]    02/06/2021 8:29 AM I attempted to contact patient on 02/04/2021 and 02/05/2021 with no answer, VM left with no return call back

## 2021-02-11 ENCOUNTER — Ambulatory Visit (HOSPITAL_BASED_OUTPATIENT_CLINIC_OR_DEPARTMENT_OTHER): Payer: Self-pay | Admitting: Family

## 2021-02-11 NOTE — Progress Notes (Deleted)
Office Visit    Patient Name: Clinton Johnson Date of Encounter: 02/11/2021  PCP:  Patient, No Pcp Per (Inactive)   [place]  Cardiologist:  Donato Heinz, MD  Advanced Practice Provider:  No care team member to display Electrophysiologist:  None      Chief Complaint    Clinton Johnson is a 50 y.o. male with a hx of coronary artery disease with DES to LAD, tobacco use, HFrEF, LE edema, HLD, hypertension  presents today for follow-up after NSTEMI  Past Medical History    Past Medical History:  Diagnosis Date   CHF (congestive heart failure) ([place])    Coronary artery disease    Hypertension    Past Surgical History:  Procedure Laterality Date   ABDOMINAL SURGERY     INTRAVASCULAR PRESSURE WIRE/FFR STUDY N/A 01/28/2021   Procedure: INTRAVASCULAR PRESSURE WIRE/FFR STUDY;  Surgeon: Lorretta Harp, MD;  Location: [facility];  Service: Cardiovascular;  Laterality: N/A;   LEFT HEART CATH AND CORONARY ANGIOGRAPHY N/A 01/28/2021   Procedure: LEFT HEART CATH AND CORONARY ANGIOGRAPHY;  Surgeon: Lorretta Harp, MD;  Location: [facility] [facility];  Service: Cardiovascular;  Laterality: N/A;    Allergies  No Known Allergies  History of Present Illness    Clinton Johnson is a 50 y.o. male with a hx of coronary artery disease with DES to LAD, tobacco use, HFrEF, LE edema, HLD, hypertension last seen hospitalized.  Presented 01/26/2021 with NSTEMI.  Troponin peaked at 2948.  Cardiac catheterization with widely patent proximal LAD stent.  Intermittent lesion proximal ramus branch was not physiologically significant by DFR 0.95.  Also intermediate mid RCA lesion not felt to be significant.  Recommended to continue medical therapy.  Echocardiogram with LVEF 35-40%.  He was discharged on beta-blocker and ARB as well as atorvastatin 80 mg daily.  He presents today for follow-up.  EKGs/Labs/Other Studies Reviewed:   The following studies were  reviewed today: Echo: 01/27/21   IMPRESSIONS     1. Akinsis of the apical septum, apical inferior, apical anterior, and  apex. Findings concerning for LAD infarction. There is suggestion of  possible LV thrombus in the apical 4 chamber views but this is not  definitive. Woud recommend a limited contrast  study for better characterization. Left ventricular ejection fraction, by  estimation, is 35 to 40%. The left ventricle has moderately decreased  function. The left ventricle demonstrates regional wall motion  abnormalities (see scoring diagram/findings for   description). There is mild concentric left ventricular hypertrophy. Left  ventricular diastolic parameters are consistent with Grade II diastolic  dysfunction (pseudonormalization).   2. Right ventricular systolic function is normal. The right ventricular  size is normal. Tricuspid regurgitation signal is inadequate for assessing  PA pressure.   3. The mitral valve is grossly normal. Trivial mitral valve  regurgitation. No evidence of mitral stenosis.   4. The aortic valve is tricuspid. Aortic valve regurgitation is not  visualized. No aortic stenosis is present.   5. The inferior vena cava is dilated in size with >50% respiratory  variability, suggesting right atrial pressure of 8 mmHg.   FINDINGS   Left Ventricle: Akinsis of the apical septum, apical inferior, apical  anterior, and apex. Findings concerning for LAD infarction. There is  suggestion of possible LV thrombus in the apical 4 chamber views but this  is not definitive. Woud recommend a  limited contrast study for better characterization. Left ventricular  ejection fraction,  by estimation, is 35 to 40%. The left ventricle has  moderately decreased function. The left ventricle demonstrates regional  wall motion abnormalities. The left  ventricular internal cavity size was normal in size. There is mild  concentric left ventricular hypertrophy. Left ventricular  diastolic  parameters are consistent with Grade II diastolic dysfunction  (pseudonormalization).      LV Wall Scoring:  The apical septal segment, apical anterior segment, apical inferior  segment,  and apex are akinetic.   Right Ventricle: The right ventricular size is normal. No increase in  right ventricular wall thickness. Right ventricular systolic function is  normal. Tricuspid regurgitation signal is inadequate for assessing PA  pressure.   Left Atrium: Left atrial size was normal in size.   Right Atrium: Right atrial size was normal in size.   Pericardium: Trivial pericardial effusion is present.   Mitral Valve: The mitral valve is grossly normal. Trivial mitral valve  regurgitation. No evidence of mitral valve stenosis.   Tricuspid Valve: The tricuspid valve is grossly normal. Tricuspid valve  regurgitation is trivial. No evidence of tricuspid stenosis.   Aortic Valve: The aortic valve is tricuspid. Aortic valve regurgitation is  not visualized. No aortic stenosis is present.   Pulmonic Valve: The pulmonic valve was grossly normal. Pulmonic valve  regurgitation is not visualized. No evidence of pulmonic stenosis.   Aorta: The aortic root is normal in size and structure.   Venous: The inferior vena cava is dilated in size with greater than 50%  respiratory variability, suggesting right atrial pressure of 8 mmHg.   IAS/Shunts: The atrial septum is grossly normal.    Echo: 01/28/21   IMPRESSIONS     1. Limited echo with Definity. No obvious thrombus at LV apex.   FINDINGS   Left Ventricle: Limited echo with Definity. No obvious thrombus at LV  apex. Definity contrast agent was given IV to delineate the left  ventricular endocardial borders.   Cath: 01/28/21      Ramus lesion is 70% stenosed.   Prox RCA lesion is 30% stenosed.   Prox RCA to Mid RCA lesion is 60% stenosed.   Previously placed Prox LAD to Mid LAD stent (unknown type) is  widely patent.    IMPRESSION: Clinton Johnson has a widely patent proximal LAD stent.  He did have an intermediate lesion in his proximal moderate-sized ramus branch which was demonstrated to be not physiologically significant by DFR (0.95).  He also has a intermediate mid RCA lesion that looks smooth and not significant.  The guidewire and catheter were removed.  The sheath was removed and a TR band was placed on the right wrist to achieve patent hemostasis.  The patient left the lab in stable condition.  He will need guideline directed optimal medical therapy for his moderately severe LV dysfunction in addition to risk factor modification including smoking cessation and treatment of his hyperlipidemia.   Clinton Johnson. MD, [facility]   Diagnostic Dominance: Right _____________    EKG:  No EKG today.  Recent Labs: 05/07/2020: B Natriuretic Peptide 760.0 01/26/2021: ALT 15; Magnesium 2.1; TSH 2.594 01/29/2021: BUN 9; Creatinine, Ser 1.25; Hemoglobin 12.7; Platelets 220; Potassium 3.8; Sodium 142  Recent Lipid Panel    Component Value Date/Time   CHOL 238 (H) 01/27/2021 0053   TRIG 257 (H) 01/27/2021 0053   HDL 44 01/27/2021 0053   CHOLHDL 5.4 01/27/2021 0053   VLDL 51 (H) 01/27/2021 0053   LDLCALC 143 (H) 01/27/2021 5732  Home Medications   No outpatient medications have been marked as taking for the 02/11/21 encounter (Appointment) with Loel Dubonnet, NP.     Review of Systems      All other systems reviewed and are otherwise negative except as noted above.  Physical Exam    VS:  There were no vitals taken for this visit. , BMI There is no height or weight on file to calculate BMI.  Wt Readings from Last 3 Encounters:  01/26/21 (S) 279 lb 5.2 oz (126.7 kg)  06/26/20 285 lb 15 oz (129.7 kg)  05/15/20 285 lb 15 oz (129.7 kg)    GEN: Well nourished, well developed, in no acute distress. HEENT: normal. Neck: Supple, no JVD, carotid bruits, or masses. Cardiac: ***RRR, no murmurs, rubs, or  gallops. No clubbing, cyanosis, edema.  ***Radials/PT 2+ and equal bilaterally.  Respiratory:  ***Respirations regular and unlabored, clear to auscultation bilaterally. GI: Soft, nontender, nondistended. MS: No deformity or atrophy. Skin: Warm and dry, no rash. Neuro:  Strength and sensation are intact. Psych: Normal affect.  Assessment & Plan    CAD-previous DES to LAD which was patent by cardiac cath 01/2021.  Noted physiologically insignificant atherosclerosis to intermediate lesion proximal ramus branch as well as intermittent mid RCA lesion.  Recommended for medical therapy. ***  HFrEF/lower extremity edema -Echo 01/2021 LVEF 35-40%, no significant valvular disease, normal RV. ***  Hyperlipidemia, LDL goal less than 70-atorvastatin 80 mg daily started during recent admission. ***  Tobacco use -   Disposition: Follow up {follow up:15908} with Donato Heinz, MD or APP.  Signed, Loel Dubonnet, NP 02/11/2021, 10:13 AM Clinton Johnson

## 2021-02-13 ENCOUNTER — Other Ambulatory Visit (HOSPITAL_COMMUNITY): Payer: Self-pay

## 2021-02-15 ENCOUNTER — Ambulatory Visit (HOSPITAL_BASED_OUTPATIENT_CLINIC_OR_DEPARTMENT_OTHER): Payer: Self-pay | Admitting: Family

## 2021-02-15 NOTE — Progress Notes (Deleted)
Office Visit    Patient Name: Clinton Johnson Date of Encounter: 02/15/2021  PCP:  Patient, No Pcp Per (Inactive)   [place]  Cardiologist:  Donato Heinz, MD  Advanced Practice Provider:  No care team member to display Electrophysiologist:  None      Chief Complaint    Clinton Johnson is a 50 y.o. male with a hx of coronary artery disease with DES to LAD, tobacco use, HFrEF, LE edema, HLD, hypertension  presents today for follow-up after NSTEMI  Past Medical History    Past Medical History:  Diagnosis Date   CHF (congestive heart failure) ([place])    Coronary artery disease    Hypertension    Past Surgical History:  Procedure Laterality Date   ABDOMINAL SURGERY     INTRAVASCULAR PRESSURE WIRE/FFR STUDY N/A 01/28/2021   Procedure: INTRAVASCULAR PRESSURE WIRE/FFR STUDY;  Surgeon: Lorretta Harp, MD;  Location: [place] [facility];  Service: Cardiovascular;  Laterality: N/A;   LEFT HEART CATH AND CORONARY ANGIOGRAPHY N/A 01/28/2021   Procedure: LEFT HEART CATH AND CORONARY ANGIOGRAPHY;  Surgeon: Lorretta Harp, MD;  Location: [place] [facility];  Service: Cardiovascular;  Laterality: N/A;    Allergies  No Known Allergies  History of Present Illness    Clinton Johnson is a 50 y.o. male with a hx of coronary artery disease with DES to LAD, tobacco use, HFrEF, LE edema, HLD, hypertension last seen hospitalized.  Presented 01/26/2021 with NSTEMI.  Troponin peaked at 2948.  Cardiac catheterization with widely patent proximal LAD stent.  Intermittent lesion proximal ramus branch was not physiologically significant by DFR 0.95.  Also intermediate mid RCA lesion not felt to be significant.  Recommended to continue medical therapy.  Echocardiogram with LVEF 35-40%.  He was discharged on beta-blocker and ARB as well as atorvastatin 80 mg daily.  He presents today for follow-up.  EKGs/Labs/Other Studies Reviewed:   The following studies were  reviewed today: Echo: 01/27/21   IMPRESSIONS     1. Akinsis of the apical septum, apical inferior, apical anterior, and  apex. Findings concerning for LAD infarction. There is suggestion of  possible LV thrombus in the apical 4 chamber views but this is not  definitive. Woud recommend a limited contrast  study for better characterization. Left ventricular ejection fraction, by  estimation, is 35 to 40%. The left ventricle has moderately decreased  function. The left ventricle demonstrates regional wall motion  abnormalities (see scoring diagram/findings for   description). There is mild concentric left ventricular hypertrophy. Left  ventricular diastolic parameters are consistent with Grade II diastolic  dysfunction (pseudonormalization).   2. Right ventricular systolic function is normal. The right ventricular  size is normal. Tricuspid regurgitation signal is inadequate for assessing  PA pressure.   3. The mitral valve is grossly normal. Trivial mitral valve  regurgitation. No evidence of mitral stenosis.   4. The aortic valve is tricuspid. Aortic valve regurgitation is not  visualized. No aortic stenosis is present.   5. The inferior vena cava is dilated in size with >50% respiratory  variability, suggesting right atrial pressure of 8 mmHg.   FINDINGS   Left Ventricle: Akinsis of the apical septum, apical inferior, apical  anterior, and apex. Findings concerning for LAD infarction. There is  suggestion of possible LV thrombus in the apical 4 chamber views but this  is not definitive. Woud recommend a  limited contrast study for better characterization. Left ventricular  ejection fraction,  by estimation, is 35 to 40%. The left ventricle has  moderately decreased function. The left ventricle demonstrates regional  wall motion abnormalities. The left  ventricular internal cavity size was normal in size. There is mild  concentric left ventricular hypertrophy. Left ventricular  diastolic  parameters are consistent with Grade II diastolic dysfunction  (pseudonormalization).      LV Wall Scoring:  The apical septal segment, apical anterior segment, apical inferior  segment,  and apex are akinetic.   Right Ventricle: The right ventricular size is normal. No increase in  right ventricular wall thickness. Right ventricular systolic function is  normal. Tricuspid regurgitation signal is inadequate for assessing PA  pressure.   Left Atrium: Left atrial size was normal in size.   Right Atrium: Right atrial size was normal in size.   Pericardium: Trivial pericardial effusion is present.   Mitral Valve: The mitral valve is grossly normal. Trivial mitral valve  regurgitation. No evidence of mitral valve stenosis.   Tricuspid Valve: The tricuspid valve is grossly normal. Tricuspid valve  regurgitation is trivial. No evidence of tricuspid stenosis.   Aortic Valve: The aortic valve is tricuspid. Aortic valve regurgitation is  not visualized. No aortic stenosis is present.   Pulmonic Valve: The pulmonic valve was grossly normal. Pulmonic valve  regurgitation is not visualized. No evidence of pulmonic stenosis.   Aorta: The aortic root is normal in size and structure.   Venous: The inferior vena cava is dilated in size with greater than 50%  respiratory variability, suggesting right atrial pressure of 8 mmHg.   IAS/Shunts: The atrial septum is grossly normal.    Echo: 01/28/21   IMPRESSIONS     1. Limited echo with Definity. No obvious thrombus at LV apex.   FINDINGS   Left Ventricle: Limited echo with Definity. No obvious thrombus at LV  apex. Definity contrast agent was given IV to delineate the left  ventricular endocardial borders.   Cath: 01/28/21      Ramus lesion is 70% stenosed.   Prox RCA lesion is 30% stenosed.   Prox RCA to Mid RCA lesion is 60% stenosed.   Previously placed Prox LAD to Mid LAD stent (unknown type) is  widely patent.    IMPRESSION: [name] has a widely patent proximal LAD stent.  He did have an intermediate lesion in his proximal moderate-sized ramus branch which was demonstrated to be not physiologically significant by DFR (0.95).  He also has a intermediate mid RCA lesion that looks smooth and not significant.  The guidewire and catheter were removed.  The sheath was removed and a TR band was placed on the right wrist to achieve patent hemostasis.  The patient left the lab in stable condition.  He will need guideline directed optimal medical therapy for his moderately severe LV dysfunction in addition to risk factor modification including smoking cessation and treatment of his hyperlipidemia.   Quay Burow. MD, [facility]   Diagnostic Dominance: Right _____________    EKG:  No EKG today.  Recent Labs: 05/07/2020: B Natriuretic Peptide 760.0 01/26/2021: ALT 15; Magnesium 2.1; TSH 2.594 01/29/2021: BUN 9; Creatinine, Ser 1.25; Hemoglobin 12.7; Platelets 220; Potassium 3.8; Sodium 142  Recent Lipid Panel    Component Value Date/Time   CHOL 238 (H) 01/27/2021 0053   TRIG 257 (H) 01/27/2021 0053   HDL 44 01/27/2021 0053   CHOLHDL 5.4 01/27/2021 0053   VLDL 51 (H) 01/27/2021 0053   LDLCALC 143 (H) 01/27/2021 3716  Home Medications   No outpatient medications have been marked as taking for the 02/15/21 encounter (Appointment) with Loel Dubonnet, NP.     Review of Systems      All other systems reviewed and are otherwise negative except as noted above.  Physical Exam    VS:  There were no vitals taken for this visit. , BMI There is no height or weight on file to calculate BMI.  Wt Readings from Last 3 Encounters:  01/26/21 (S) 279 lb 5.2 oz (126.7 kg)  06/26/20 285 lb 15 oz (129.7 kg)  05/15/20 285 lb 15 oz (129.7 kg)    GEN: Well nourished, well developed, in no acute distress. HEENT: normal. Neck: Supple, no JVD, carotid bruits, or masses. Cardiac: ***RRR, no murmurs, rubs, or  gallops. No clubbing, cyanosis, edema.  ***Radials/PT 2+ and equal bilaterally.  Respiratory:  ***Respirations regular and unlabored, clear to auscultation bilaterally. GI: Soft, nontender, nondistended. MS: No deformity or atrophy. Skin: Warm and dry, no rash. Neuro:  Strength and sensation are intact. Psych: Normal affect.  Assessment & Plan    CAD-previous DES to LAD which was patent by cardiac cath 01/2021.  Noted physiologically insignificant atherosclerosis to intermediate lesion proximal ramus branch as well as intermittent mid RCA lesion.  Recommended for medical therapy. ***  HFrEF/lower extremity edema -Echo 01/2021 LVEF 35-40%, no significant valvular disease, normal RV. ***  Hyperlipidemia, LDL goal less than 70-atorvastatin 80 mg daily started during recent admission. ***  Tobacco use -   Disposition: Follow up {follow up:15908} with Donato Heinz, MD or APP.  Signed, Loel Dubonnet, NP 02/15/2021, 1:33 PM [facility]

## 2021-06-11 ENCOUNTER — Other Ambulatory Visit: Payer: Self-pay | Admitting: Cardiology

## 2021-06-17 ENCOUNTER — Encounter (HOSPITAL_BASED_OUTPATIENT_CLINIC_OR_DEPARTMENT_OTHER): Payer: Self-pay

## 2021-06-17 ENCOUNTER — Emergency Department (HOSPITAL_BASED_OUTPATIENT_CLINIC_OR_DEPARTMENT_OTHER)
Admission: EM | Admit: 2021-06-17 | Discharge: 2021-06-17 | Disposition: A | Discharge status: Home / Self Care | Payer: Self-pay | Attending: Emergency Medicine | Admitting: Emergency Medicine

## 2021-06-17 ENCOUNTER — Other Ambulatory Visit: Payer: Self-pay

## 2021-06-17 DIAGNOSIS — R030 Elevated blood-pressure reading, without diagnosis of hypertension: Secondary | ICD-10-CM | POA: Insufficient documentation

## 2021-06-17 DIAGNOSIS — Z7982 Long term (current) use of aspirin: Secondary | ICD-10-CM | POA: Insufficient documentation

## 2021-06-17 DIAGNOSIS — Z760 Encounter for issue of repeat prescription: Secondary | ICD-10-CM | POA: Insufficient documentation

## 2021-06-17 DIAGNOSIS — R600 Localized edema: Secondary | ICD-10-CM | POA: Insufficient documentation

## 2021-06-17 MED ORDER — FUROSEMIDE 40 MG PO TABS
40.0000 mg | ORAL_TABLET | Freq: Every day | ORAL | 0 refills | Status: DC
Start: 2021-06-17 — End: 2021-07-30

## 2021-06-17 MED ORDER — LOSARTAN POTASSIUM 25 MG PO TABS
25.0000 mg | ORAL_TABLET | Freq: Once | ORAL | Status: AC
  Administered 2021-06-17: 25 mg via ORAL
  Filled 2021-06-17: qty 1

## 2021-06-17 MED ORDER — LOSARTAN POTASSIUM 25 MG PO TABS
25.0000 mg | ORAL_TABLET | Freq: Every day | ORAL | 0 refills | Status: DC
Start: 2021-06-17 — End: 2021-07-30

## 2021-06-17 MED ORDER — FUROSEMIDE 40 MG PO TABS
40.0000 mg | ORAL_TABLET | Freq: Once | ORAL | Status: AC
  Administered 2021-06-17: 40 mg via ORAL
  Filled 2021-06-17: qty 1

## 2021-06-17 NOTE — Discharge Instructions (Signed)
Your medications have been refilled and sent to the pharmacy for you.  You received your first doses here in the emergency room.  Currently you denied any chest pain, shortness of breath or other complaints.  If you have any concerning symptoms you can return for evaluation.  Otherwise have attached [facility] for you to establish primary care with. ?

## 2021-06-17 NOTE — ED Triage Notes (Addendum)
Pt arrives with reports of being out of his BP medications for 2 days and needs a refill. Pt is out of lasix and cozaar.  ?

## 2021-06-17 NOTE — ED Provider Notes (Signed)
?[facility] EMER[facility]ENC[facility] DEPARTMENT ?Provider Note ? ? ?CSN: [id number] ?Arrival date & time: 06/17/21  1735 ? ?  ? ?History ? ?Chief Complaint  ?Patient presents with  ? Medication Refill  ? ? ?Kayhan Boardley is a 51 y.o. male. ? ?51 year old male presents today for request of medication refill.  He states he has been out of his medications for the past 2 days which include Cozaar and Lasix.  He states he was recently fired by his cardiologist and needs his medication refilled.  Currently his blood pressure is elevated on arrival.  He states with his medication his blood pressure is always under control.  Denies chest pain, shortness of breath, visual change, lightheadedness.  He denies any unilateral leg swelling.  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  He states he takes frequent breaks while he is driving to go on walks he states that every 3-4 hours. ? ?The history is provided by the patient. No language interpreter was used.  ? ?  ? ?Home Medications ?Prior to Admission medications   ?Medication Sig Start Date End Date Taking? Authorizing Provider  ?aspirin 81 MG EC tablet Take 81 mg by mouth daily.    [provider]  ?atorvastatin (LIPITOR) 80 MG tablet Take 1 tablet (80 mg total) by mouth daily. 01/30/21   Cheryln Manly, NP  ?furosemide (LASIX) 40 MG tablet Take 1 tablet (40 mg total) by mouth daily. 06/17/21   Evlyn Courier, PA-C  ?losartan (COZAAR) 25 MG tablet Take 1 tablet (25 mg total) by mouth daily. 06/17/21   Evlyn Courier, PA-C  ?metoprolol tartrate (LOPRESSOR) 25 MG tablet Take 1 tablet (25 mg total) by mouth 2 (two) times daily. 01/29/21   Cheryln Manly, NP  ?nicotine (NICODERM CQ - DOSED IN MG/24 HOURS) 14 mg/24hr patch Place 1 patch (14 mg total) onto the skin daily. 01/30/21   Cheryln Manly, NP  ?nitroGLYCERIN (NITROSTAT) 0.4 MG SL tablet Place 1 tablet (0.4 mg total) under the tongue every 5 (five) minutes x 3 doses as needed for chest pain. 01/29/21   Cheryln Manly, NP   ?potassium chloride SA (KLOR-CON M) 20 MEQ tablet Take 1 tablet (20 mEq total) by mouth daily. 01/29/21   Cheryln Manly, NP  ?diphenhydrAMINE (BENADRYL) 25 MG tablet Take 1 tablet (25 mg total) by mouth every 6 (six) hours. ?Patient not taking: Reported on 12/09/2015 06/06/15 03/03/20  Jola Schmidt, MD  ?fluticasone [facility]) 50 MCG/ACT nasal spray Place 2 sprays into both nostrils daily. ?Patient not taking: Reported on 12/09/2015 06/06/15 03/03/20  Jola Schmidt, MD  ?   ? ?Allergies    ?Patient has no known allergies.   ? ?Review of Systems   ?Review of Systems  ?Eyes:  Negative for visual disturbance.  ?Respiratory:  Negative for shortness of breath.   ?Cardiovascular:  Positive for leg swelling (Bilateral and pitting). Negative for chest pain and palpitations.  ?Neurological:  Negative for syncope, light-headedness and headaches.  ?All other systems reviewed and are negative. ? ?Physical Exam ?Updated Vital Signs ?BP (!) 174/112   Pulse 87   Temp 98.1 ?F (36.7 ?C) (Oral)   Resp 18   Ht '6\' 1"'$  (1.854 m)   Wt 117.9 kg   SpO2 100%   BMI 34.30 kg/m?  ?Physical Exam ?Vitals and nursing note reviewed.  ?Constitutional:   ?   General: He is not in acute distress. ?   Appearance: Normal appearance. He is not ill-appearing.  ?HENT:  ?  Head: Normocephalic and atraumatic.  ?   Nose: Nose normal.  ?Eyes:  ?   General: No scleral icterus. ?   Extraocular Movements: Extraocular movements intact.  ?   Conjunctiva/sclera: Conjunctivae normal.  ?Cardiovascular:  ?   Rate and Rhythm: Normal rate and regular rhythm.  ?   Pulses: Normal pulses.  ?   Heart sounds: Normal heart sounds.  ?Pulmonary:  ?   Effort: Pulmonary effort is normal. No respiratory distress.  ?   Breath sounds: Normal breath sounds. No wheezing or rales.  ?Musculoskeletal:     ?   General: Normal range of motion.  ?   Cervical back: Normal range of motion.  ?   Right lower leg: Edema (Trace pitting edema) present.  ?   Left lower leg: Edema (Trace pitting  edema) present.  ?Skin: ?   General: Skin is warm and dry.  ?Neurological:  ?   General: No focal deficit present.  ?   Mental Status: He is alert. Mental status is at baseline.  ? ? ?ED Results / Procedures / Treatments   ?Labs ?(all labs ordered are listed, but only abnormal results are displayed) ?Labs Reviewed - No data to display ? ?EKG ?None ? ?Radiology ?No results found. ? ?Procedures ?Procedures  ? ? ?Medications Ordered in ED ?Medications  ?losartan (COZAAR) tablet 25 mg (25 mg Oral Given 06/17/21 1855)  ?furosemide (LASIX) tablet 40 mg (40 mg Oral Given 06/17/21 1855)  ? ? ?ED Course/ Medical Decision Making/ A&P ?  ?                        ?Medical Decision Making ?Risk ?Prescription drug management. ? ? ?51 year old male presents for medication refill.  He has been out of his medications for the past 2 days.  Last dose 2 days ago.  Elevated BP on arrival.  He is without lightheadedness, visual change, chest pain, shortness of breath.  Initially slightly tachycardic on arrival however low risk for PE.  No signs of DVT on exam.  Denies shortness of breath.  We did discuss doing basic blood work including D-dimer however he refused.  We did have discussion regarding return precautions for worsening symptoms or concern for PE.  First dose of medications given in the department.  Additional sent to patient's choice of pharmacy.  Patient is appropriate for discharge.  Discharged in stable condition.  Initially slightly tachycardic at 102 however on reeval improved to 87.  She also provided with follow-up information with  community health and wellness clinic. ? ?Final Clinical Impression(s) / ED Diagnoses ?Final diagnoses:  ?Medication refill  ? ? ?Rx / DC Orders ?ED Discharge Orders   ? ?      Ordered  ?  losartan (COZAAR) 25 MG tablet  Daily       ? 06/17/21 1852  ?  furosemide (LASIX) 40 MG tablet  Daily       ? 06/17/21 1852  ? ?  ?  ? ?  ? ? ?  ?Evlyn Courier, PA-C ?06/17/21 1905 ? ?  ?Tegeler,  Gwenyth Allegra, MD ?06/17/21 2041 ? ?

## 2021-07-30 ENCOUNTER — Other Ambulatory Visit: Payer: Self-pay

## 2021-07-30 ENCOUNTER — Encounter (HOSPITAL_BASED_OUTPATIENT_CLINIC_OR_DEPARTMENT_OTHER): Payer: Self-pay | Admitting: Emergency Medicine

## 2021-07-30 ENCOUNTER — Emergency Department (HOSPITAL_BASED_OUTPATIENT_CLINIC_OR_DEPARTMENT_OTHER)
Admission: EM | Admit: 2021-07-30 | Discharge: 2021-07-30 | Disposition: A | Discharge status: Home / Self Care | Payer: Commercial Managed Care - HMO | Attending: Emergency Medicine | Admitting: Emergency Medicine

## 2021-07-30 ENCOUNTER — Other Ambulatory Visit (HOSPITAL_BASED_OUTPATIENT_CLINIC_OR_DEPARTMENT_OTHER): Payer: Self-pay

## 2021-07-30 ENCOUNTER — Emergency Department (HOSPITAL_BASED_OUTPATIENT_CLINIC_OR_DEPARTMENT_OTHER): Payer: Commercial Managed Care - HMO

## 2021-07-30 DIAGNOSIS — S3991XA Unspecified injury of abdomen, initial encounter: Secondary | ICD-10-CM | POA: Diagnosis present

## 2021-07-30 DIAGNOSIS — S39011A Strain of muscle, fascia and tendon of abdomen, initial encounter: Secondary | ICD-10-CM | POA: Diagnosis not present

## 2021-07-30 DIAGNOSIS — Z7982 Long term (current) use of aspirin: Secondary | ICD-10-CM | POA: Insufficient documentation

## 2021-07-30 DIAGNOSIS — I110 Hypertensive heart disease with heart failure: Secondary | ICD-10-CM | POA: Diagnosis not present

## 2021-07-30 DIAGNOSIS — Z79899 Other long term (current) drug therapy: Secondary | ICD-10-CM | POA: Insufficient documentation

## 2021-07-30 DIAGNOSIS — I509 Heart failure, unspecified: Secondary | ICD-10-CM | POA: Insufficient documentation

## 2021-07-30 DIAGNOSIS — R1084 Generalized abdominal pain: Secondary | ICD-10-CM

## 2021-07-30 DIAGNOSIS — I2510 Atherosclerotic heart disease of native coronary artery without angina pectoris: Secondary | ICD-10-CM | POA: Insufficient documentation

## 2021-07-30 DIAGNOSIS — X500XXA Overexertion from strenuous movement or load, initial encounter: Secondary | ICD-10-CM | POA: Insufficient documentation

## 2021-07-30 LAB — CBC WITH DIFFERENTIAL/PLATELET
Abs Immature Granulocytes: 0.02 K/uL (ref 0.00–0.07)
Basophils Absolute: 0.1 K/uL (ref 0.0–0.1)
Basophils Relative: 1 %
Eosinophils Absolute: 0.2 K/uL (ref 0.0–0.5)
Eosinophils Relative: 3 %
HCT: 43.7 % (ref 39.0–52.0)
Hemoglobin: 14.1 g/dL (ref 13.0–17.0)
Immature Granulocytes: 0 %
Lymphocytes Relative: 38 %
Lymphs Abs: 2.8 K/uL (ref 0.7–4.0)
MCH: 27.8 pg (ref 26.0–34.0)
MCHC: 32.3 g/dL (ref 30.0–36.0)
MCV: 86.2 fL (ref 80.0–100.0)
Monocytes Absolute: 1 K/uL (ref 0.1–1.0)
Monocytes Relative: 14 %
Neutro Abs: 3.3 K/uL (ref 1.7–7.7)
Neutrophils Relative %: 44 %
Platelets: 227 K/uL (ref 150–400)
RBC: 5.07 MIL/uL (ref 4.22–5.81)
RDW: 15.9 % — ABNORMAL HIGH (ref 11.5–15.5)
WBC: 7.5 K/uL (ref 4.0–10.5)
nRBC: 0 % (ref 0.0–0.2)

## 2021-07-30 LAB — COMPREHENSIVE METABOLIC PANEL
ALT: 13 U/L (ref 0–44)
AST: 15 U/L (ref 15–41)
Albumin: 3.9 g/dL (ref 3.5–5.0)
Alkaline Phosphatase: 71 U/L (ref 38–126)
Anion gap: 6 (ref 5–15)
BUN: 10 mg/dL (ref 6–20)
CO2: 26 mmol/L (ref 22–32)
Calcium: 8.8 mg/dL — ABNORMAL LOW (ref 8.9–10.3)
Chloride: 106 mmol/L (ref 98–111)
Creatinine, Ser: 1.3 mg/dL — ABNORMAL HIGH (ref 0.61–1.24)
GFR, Estimated: >60 mL/min (ref >60)
Glucose, Bld: 79 mg/dL (ref 70–99)
Potassium: 3.8 mmol/L (ref 3.5–5.1)
Sodium: 138 mmol/L (ref 135–145)
Total Bilirubin: 0.7 mg/dL (ref 0.3–1.2)
Total Protein: 7.7 g/dL (ref 6.5–8.1)

## 2021-07-30 LAB — LIPASE, BLOOD: Lipase: 25 U/L (ref 11–51)

## 2021-07-30 MED ORDER — NAPROXEN 375 MG PO TABS
375.0000 mg | ORAL_TABLET | Freq: Two times a day (BID) | ORAL | 0 refills | Status: DC
  Filled 2021-07-30: qty 20, 10d supply, fill #0

## 2021-07-30 MED ORDER — KETOROLAC TROMETHAMINE 15 MG/ML IJ SOLN
15.0000 mg | Freq: Once | INTRAMUSCULAR | Status: AC
  Administered 2021-07-30: 15 mg via INTRAVENOUS
  Filled 2021-07-30: qty 1

## 2021-07-30 MED ORDER — METHOCARBAMOL 500 MG PO TABS
500.0000 mg | ORAL_TABLET | Freq: Two times a day (BID) | ORAL | 0 refills | Status: DC
  Filled 2021-07-30: qty 20, 10d supply, fill #0

## 2021-07-30 MED ORDER — FUROSEMIDE 40 MG PO TABS
40.0000 mg | ORAL_TABLET | Freq: Every day | ORAL | 0 refills | Status: DC
  Filled 2021-07-30: qty 30, 30d supply, fill #0

## 2021-07-30 MED ORDER — IOHEXOL 300 MG/ML  SOLN
100.0000 mL | Freq: Once | INTRAMUSCULAR | Status: AC | PRN
  Administered 2021-07-30: 100 mL via INTRAVENOUS

## 2021-07-30 MED ORDER — LOSARTAN POTASSIUM 25 MG PO TABS
25.0000 mg | ORAL_TABLET | Freq: Every day | ORAL | 0 refills | Status: DC
  Filled 2021-07-30: qty 30, 30d supply, fill #0

## 2021-07-30 MED ORDER — SODIUM CHLORIDE 0.9 % IV BOLUS
1000.0000 mL | Freq: Once | INTRAVENOUS | Status: AC
  Administered 2021-07-30: 1000 mL via INTRAVENOUS

## 2021-07-30 MED ORDER — ONDANSETRON HCL 4 MG/2ML IJ SOLN
4.0000 mg | Freq: Once | INTRAMUSCULAR | Status: AC
  Administered 2021-07-30: 4 mg via INTRAVENOUS
  Filled 2021-07-30: qty 2

## 2021-07-30 NOTE — ED Provider Notes (Signed)
[facility] Provider Note   CSN: [id number] Arrival date & time: 07/30/21  1354     History  Chief Complaint  Patient presents with   Abdominal Pain    Clinton Johnson is a 51 y.o. male.  51 year old male with past medical history significant for MI, CHF, hypertension presents today for evaluation of abdominal pain of about 2 to 3-day duration.  He works for [employer] and does a lot of lifting.  He states he remembers lifting something heavy that weighed about 300 pounds 4 days ago.  He states he does remember tweaking something but the pain did not start until couple days later.  He states last night it became severe.  He does endorse associated nausea but denies vomiting.  Denies back pain, shortness of breath, chest pain, peripheral edema.   The history is provided by the patient. No language interpreter was used.      Home Medications Prior to Admission medications   Medication Sig Start Date End Date Taking? Authorizing Provider  aspirin 81 MG EC tablet Take 81 mg by mouth daily.    [provider]  atorvastatin (LIPITOR) 80 MG tablet Take 1 tablet (80 mg total) by mouth daily. 01/30/21   Cheryln Manly, NP  furosemide (LASIX) 40 MG tablet Take 1 tablet (40 mg total) by mouth daily. 06/17/21   Deatra Canter, Sarye Kath, PA-C  losartan (COZAAR) 25 MG tablet Take 1 tablet (25 mg total) by mouth daily. 06/17/21   Evlyn Courier, PA-C  metoprolol tartrate (LOPRESSOR) 25 MG tablet Take 1 tablet (25 mg total) by mouth 2 (two) times daily. 01/29/21   Cheryln Manly, NP  nicotine (NICODERM CQ - DOSED IN MG/24 HOURS) 14 mg/24hr patch Place 1 patch (14 mg total) onto the skin daily. 01/30/21   Cheryln Manly, NP  nitroGLYCERIN (NITROSTAT) 0.4 MG SL tablet Place 1 tablet (0.4 mg total) under the tongue every 5 (five) minutes x 3 doses as needed for chest pain. 01/29/21   Cheryln Manly, NP  potassium chloride SA (KLOR-CON M) 20 MEQ tablet Take 1 tablet (20 mEq  total) by mouth daily. 01/29/21   Cheryln Manly, NP  diphenhydrAMINE (BENADRYL) 25 MG tablet Take 1 tablet (25 mg total) by mouth every 6 (six) hours. Patient not taking: Reported on 12/09/2015 06/06/15 03/03/20  Jola Schmidt, MD  fluticasone [facility]) 50 MCG/ACT nasal spray Place 2 sprays into both nostrils daily. Patient not taking: Reported on 12/09/2015 06/06/15 03/03/20  Jola Schmidt, MD      Allergies    Patient has no known allergies.    Review of Systems   Review of Systems  Constitutional:  Negative for chills and fever.  Respiratory:  Negative for shortness of breath.   Cardiovascular:  Negative for chest pain.  Gastrointestinal:  Positive for abdominal pain and nausea. Negative for blood in stool and vomiting.  Musculoskeletal:  Negative for arthralgias and back pain.  All other systems reviewed and are negative.  Physical Exam Updated Vital Signs BP (!) 140/110   Pulse 86   Temp 98.5 F (36.9 C) (Oral)   Resp 18   SpO2 97%  Physical Exam Vitals and nursing note reviewed.  Constitutional:      General: He is not in acute distress.    Appearance: Normal appearance. He is not ill-appearing.  HENT:     Head: Normocephalic and atraumatic.     Nose: Nose normal.  Eyes:     General: No  scleral icterus.    Extraocular Movements: Extraocular movements intact.     Conjunctiva/sclera: Conjunctivae normal.  Cardiovascular:     Rate and Rhythm: Normal rate and regular rhythm.     Pulses: Normal pulses.     Heart sounds: Normal heart sounds.  Pulmonary:     Effort: Pulmonary effort is normal. No respiratory distress.     Breath sounds: Normal breath sounds. No wheezing or rales.  Abdominal:     General: There is no distension.     Palpations: Abdomen is soft.     Tenderness: There is abdominal tenderness. There is guarding. There is no right CVA tenderness, left CVA tenderness or rebound.  Musculoskeletal:        General: Normal range of motion.     Cervical back:  Normal range of motion.     Right lower leg: No edema.     Left lower leg: No edema.  Skin:    General: Skin is warm and dry.  Neurological:     General: No focal deficit present.     Mental Status: He is alert. Mental status is at baseline.    ED Results / Procedures / Treatments   Labs (all labs ordered are listed, but only abnormal results are displayed) Labs Reviewed  CBC WITH DIFFERENTIAL/PLATELET - Abnormal; Notable for the following components:      Result Value   RDW 15.9 (*)    All other components within normal limits  COMPREHENSIVE METABOLIC PANEL - Abnormal; Notable for the following components:   Creatinine, Ser 1.30 (*)    Calcium 8.8 (*)    All other components within normal limits  LIPASE, BLOOD    EKG None  Radiology No results found.  Procedures Procedures    Medications Ordered in ED Medications  sodium chloride 0.9 % bolus 1,000 mL (0 mLs Intravenous Stopped 07/30/21 1538)  ondansetron (ZOFRAN) injection 4 mg (4 mg Intravenous Given 07/30/21 1531)  ketorolac (TORADOL) 15 MG/ML injection 15 mg (15 mg Intravenous Given 07/30/21 1531)  iohexol (OMNIPAQUE) 300 MG/ML solution 100 mL (100 mLs Intravenous Contrast Given 07/30/21 1556)    ED Course/ Medical Decision Making/ A&P                           Medical Decision Making Amount and/or Complexity of Data Reviewed Labs: ordered. Radiology: ordered.  Risk Prescription drug management.   Medical Decision Making / ED Course   This patient presents to the ED for concern of abdominal pain, this involves an extensive number of treatment options, and is a complaint that carries with it a high risk of complications and morbidity.  The differential diagnosis includes pancreatitis, appendicitis, gastroenteritis, cholecystitis, constipation, muscle strain, hernia  MDM: 51 year old male presents for evaluation of abdominal pain of about 2 to 3 days duration.  He does have history of lifting something heavy  about 4 days ago.  He has diffuse abdominal tenderness with mild guarding.  However he does appear comfortable on exam.  Endorses nausea but without vomiting.  Denies any changes to his stool habits.  Tolerating p.o. intake without difficulty.  Without fever or tachycardia.  Without evidence of hernia on exam.  CBC without leukocytosis or anemia.  CMP with creatinine of 1.30 which is around his baseline otherwise unremarkable.  Lipase of 25 which is within normal limits.  CT abdomen pelvis with contrast without acute intra-abdominal or pelvis findings.  Given patient's history most likely a  muscle strain.  Will treat with naproxen and Robaxin.  Low suspicion for acute intra-abdominal process given the above work-up.  He is also requesting refills for losartan and Lasix.  We will provide this.  We will give him referral to [facility] as he does not have PCP or current cardiologist.  Patient is euvolemic on exam today.   Lab Tests: -I ordered, reviewed, and interpreted labs.   The pertinent results include:   Labs Reviewed  CBC WITH DIFFERENTIAL/PLATELET - Abnormal; Notable for the following components:      Result Value   RDW 15.9 (*)    All other components within normal limits  COMPREHENSIVE METABOLIC PANEL - Abnormal; Notable for the following components:   Creatinine, Ser 1.30 (*)    Calcium 8.8 (*)    All other components within normal limits  LIPASE, BLOOD      EKG  EKG Interpretation  Date/Time:    Ventricular Rate:    PR Interval:    QRS Duration:   QT Interval:    QTC Calculation:   R Axis:     Text Interpretation:           Imaging Studies ordered: I ordered imaging studies including CT abdomen and pelvis with contrast I independently visualized and interpreted imaging. I agree with the radiologist interpretation   Medicines ordered and prescription drug management: Meds ordered this encounter  Medications   sodium chloride  0.9 % bolus 1,000 mL   ondansetron (ZOFRAN) injection 4 mg   ketorolac (TORADOL) 15 MG/ML injection 15 mg   iohexol (OMNIPAQUE) 300 MG/ML solution 100 mL    -I have reviewed the patients home medicines and have made adjustments as needed  Reevaluation: After the interventions noted above, I reevaluated the patient and found that they have :improved  Co morbidities that complicate the patient evaluation  Past Medical History:  Diagnosis Date   CHF (congestive heart failure) ([place])    Coronary artery disease    Hypertension       Dispostion: Patient is appropriate for discharge.  Discharged in stable condition.  Return precautions discussed.  Patient voices understanding and is in agreement with plan.  Final Clinical Impression(s) / ED Diagnoses Final diagnoses:  Generalized abdominal pain  Abdominal wall strain, initial encounter    Rx / DC Orders ED Discharge Orders          Ordered    losartan (COZAAR) 25 MG tablet  Daily        07/30/21 1643    furosemide (LASIX) 40 MG tablet  Daily        07/30/21 1643    naproxen (NAPROSYN) 375 MG tablet  2 times daily        07/30/21 1643    methocarbamol (ROBAXIN) 500 MG tablet  2 times daily        07/30/21 1643              Evlyn Courier, [place] 07/30/21 1643    Tegeler, Gwenyth Allegra, MD 07/30/21 [phone]

## 2021-07-30 NOTE — Discharge Instructions (Addendum)
Your work-up today was reassuring and without concerning cause of your abdominal pain.  You likely have a muscle strain given your history of lifting something heavy a day prior to your symptoms starting.  I have sent a muscle relaxer called Robaxin, an anti-inflammatory called naproxen.  Robaxin can make you drowsy so do not drive after taking this medication.  Have also sent refill for your Lasix and losartan.  It is imperative that you call the clinic as listed above for you to establish care.

## 2021-07-30 NOTE — ED Triage Notes (Signed)
Pt c/o periumbilical abdominal pain x 4 days. Endorses nausea. Reports lifting something heavy and now feels a lump in his abdomen.    Also reports missing one dose of his lasix due to running out of his prescription. Denies shob. Endorses BLLE edema only during the day. Edema resolves after laying down.

## 2021-09-11 ENCOUNTER — Emergency Department (HOSPITAL_BASED_OUTPATIENT_CLINIC_OR_DEPARTMENT_OTHER)
Admission: EM | Admit: 2021-09-11 | Discharge: 2021-09-12 | Disposition: A | Discharge status: Home / Self Care | Payer: Commercial Managed Care - HMO | Attending: Emergency Medicine | Admitting: Emergency Medicine

## 2021-09-11 ENCOUNTER — Encounter (HOSPITAL_BASED_OUTPATIENT_CLINIC_OR_DEPARTMENT_OTHER): Payer: Self-pay | Admitting: Emergency Medicine

## 2021-09-11 DIAGNOSIS — I10 Essential (primary) hypertension: Secondary | ICD-10-CM | POA: Insufficient documentation

## 2021-09-11 DIAGNOSIS — Z7982 Long term (current) use of aspirin: Secondary | ICD-10-CM | POA: Insufficient documentation

## 2021-09-11 DIAGNOSIS — Z760 Encounter for issue of repeat prescription: Secondary | ICD-10-CM | POA: Insufficient documentation

## 2021-09-11 DIAGNOSIS — Z79899 Other long term (current) drug therapy: Secondary | ICD-10-CM | POA: Diagnosis not present

## 2021-09-11 NOTE — ED Triage Notes (Addendum)
Pt requesting refill on BP meds (Losartan and Lasix). Ran out 2 days ago. States cannot get appt with PCP for 2 months. Denies sx.

## 2021-09-12 ENCOUNTER — Other Ambulatory Visit (HOSPITAL_BASED_OUTPATIENT_CLINIC_OR_DEPARTMENT_OTHER): Payer: Self-pay

## 2021-09-12 MED ORDER — FUROSEMIDE 40 MG PO TABS
40.0000 mg | ORAL_TABLET | Freq: Every day | ORAL | 0 refills | Status: DC
  Filled 2021-09-12: qty 30, 30d supply, fill #0

## 2021-09-12 MED ORDER — LOSARTAN POTASSIUM 25 MG PO TABS
25.0000 mg | ORAL_TABLET | Freq: Once | ORAL | Status: AC
Start: 2021-09-12 — End: 2021-09-12
  Administered 2021-09-12: 25 mg via ORAL
  Filled 2021-09-12: qty 1

## 2021-09-12 MED ORDER — LOSARTAN POTASSIUM 25 MG PO TABS
25.0000 mg | ORAL_TABLET | Freq: Every day | ORAL | 1 refills | Status: DC
  Filled 2021-09-12: qty 30, 30d supply, fill #0

## 2021-09-12 NOTE — ED Provider Notes (Signed)
[name] Provider Note   CSN: [id number] Arrival date & time: 09/11/21  2331     History  Chief Complaint  Patient presents with   Medication Refill    Clinton Johnson is a 51 y.o. male.  The history is provided by the patient.  Medication Refill Patient presents for medication refill.  He reports he is out of his blood pressure medicine He ran out 2 days ago.  He has no new complaints.  No headache is reported.  He just established with a PCP in his hometown of [place], however is unable to be seen for 2 months and the doctor would not prescribe medications     Home Medications Prior to Admission medications   Medication Sig Start Date End Date Taking? Authorizing Provider  furosemide (LASIX) 40 MG tablet Take 1 tablet (40 mg total) by mouth daily. 09/12/21  Yes Ripley Fraise, MD  losartan (COZAAR) 25 MG tablet Take 1 tablet (25 mg total) by mouth daily. 09/12/21  Yes Ripley Fraise, MD  aspirin 81 MG EC tablet Take 81 mg by mouth daily.    [provider]  atorvastatin (LIPITOR) 80 MG tablet Take 1 tablet (80 mg total) by mouth daily. 01/30/21   Cheryln Manly, NP  metoprolol tartrate (LOPRESSOR) 25 MG tablet Take 1 tablet (25 mg total) by mouth 2 (two) times daily. 01/29/21   Cheryln Manly, NP  nicotine (NICODERM CQ - DOSED IN MG/24 HOURS) 14 mg/24hr patch Place 1 patch (14 mg total) onto the skin daily. 01/30/21   Cheryln Manly, NP  nitroGLYCERIN (NITROSTAT) 0.4 MG SL tablet Place 1 tablet (0.4 mg total) under the tongue every 5 (five) minutes x 3 doses as needed for chest pain. 01/29/21   Cheryln Manly, NP  diphenhydrAMINE (BENADRYL) 25 MG tablet Take 1 tablet (25 mg total) by mouth every 6 (six) hours. Patient not taking: Reported on 12/09/2015 06/06/15 03/03/20  Jola Schmidt, MD  fluticasone [facility]) 50 MCG/ACT nasal spray Place 2 sprays into both nostrils daily. Patient not taking: Reported  on 12/09/2015 06/06/15 03/03/20  Jola Schmidt, MD      Allergies    Patient has no known allergies.    Review of Systems   Review of Systems  Physical Exam Updated Vital Signs BP (!) 158/100   Pulse 80   Temp 98.5 F (36.9 C) (Oral)   Resp 15   SpO2 99%  Physical Exam CONSTITUTIONAL: Well developed/well nourished HEAD: Normocephalic/atraumatic EYES: EOMI NEURO: Pt is awake/alert/appropriate, moves all extremitiesx4.  No facial droop.   SKIN: warm, color normal PSYCH: no abnormalities of mood noted, alert and oriented to situation  ED Results / Procedures / Treatments   Labs (all labs ordered are listed, but only abnormal results are displayed) Labs Reviewed - No data to display  EKG None  Radiology No results found.  Procedures Procedures    Medications Ordered in ED Medications  losartan (COZAAR) tablet 25 mg (25 mg Oral Given 09/12/21 0056)    ED Course/ Medical Decision Making/ A&P                           Medical Decision Making Risk Prescription drug management.   Patient here for medication refill as he is unable to see his PCP for 2 months.  Recent labs from visit from May 30 were reviewed.  We will give refill of her losartan and Lasix.  We will also give an additional refill for the losartan since he will be unable to be seen by his PCP for 2 months.  However he will need to have labs rechecked prior to refill of the Lasix        Final Clinical Impression(s) / ED Diagnoses Final diagnoses:  Primary hypertension    Rx / DC Orders ED Discharge Orders          Ordered    losartan (COZAAR) 25 MG tablet  Daily        09/12/21 0048    furosemide (LASIX) 40 MG tablet  Daily        09/12/21 0048              Ripley Fraise, MD 09/12/21 0129

## 2021-10-24 ENCOUNTER — Encounter (HOSPITAL_BASED_OUTPATIENT_CLINIC_OR_DEPARTMENT_OTHER): Payer: Self-pay | Admitting: Emergency Medicine

## 2021-10-24 ENCOUNTER — Emergency Department (HOSPITAL_BASED_OUTPATIENT_CLINIC_OR_DEPARTMENT_OTHER)
Admission: EM | Admit: 2021-10-24 | Discharge: 2021-10-24 | Disposition: A | Discharge status: Home / Self Care | Payer: Commercial Managed Care - HMO | Attending: Emergency Medicine | Admitting: Emergency Medicine

## 2021-10-24 ENCOUNTER — Other Ambulatory Visit: Payer: Self-pay

## 2021-10-24 DIAGNOSIS — I10 Essential (primary) hypertension: Secondary | ICD-10-CM | POA: Diagnosis present

## 2021-10-24 DIAGNOSIS — Z79899 Other long term (current) drug therapy: Secondary | ICD-10-CM | POA: Diagnosis not present

## 2021-10-24 DIAGNOSIS — Z7982 Long term (current) use of aspirin: Secondary | ICD-10-CM | POA: Diagnosis not present

## 2021-10-24 DIAGNOSIS — Z760 Encounter for issue of repeat prescription: Secondary | ICD-10-CM | POA: Diagnosis not present

## 2021-10-24 MED ORDER — FUROSEMIDE 40 MG PO TABS: 40.0000 mg | ORAL_TABLET | Freq: Every day | ORAL | 0 refills | Status: DC

## 2021-10-24 MED ORDER — LOSARTAN POTASSIUM 25 MG PO TABS: 25.0000 mg | ORAL_TABLET | Freq: Every day | ORAL | 1 refills | Status: DC

## 2021-10-24 MED ORDER — ATORVASTATIN CALCIUM 80 MG PO TABS: 80.0000 mg | ORAL_TABLET | Freq: Every day | ORAL | 1 refills | Status: DC

## 2021-10-24 NOTE — ED Triage Notes (Signed)
Pt states he has hx of HTN and has not had his medication x 4 days   Pt also states he is out of his fluid pill

## 2021-10-24 NOTE — ED Provider Notes (Signed)
[facility] Provider Note   CSN: [id number] Arrival date & time: 10/24/21  0865     History  Chief Complaint  Patient presents with   Hypertension    Clinton Johnson is a 51 y.o. male.  51 yo M with a chief complaints of needing his medicines refilled.  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  He has been to this facility before and is able to come here before he needs to get on the road.  He denies any other concerns.  Denies chest pain denies difficulty breathing.   Hypertension       Home Medications Prior to Admission medications   Medication Sig Start Date End Date Taking? Authorizing Provider  aspirin 81 MG EC tablet Take 81 mg by mouth daily.    [provider]  atorvastatin (LIPITOR) 80 MG tablet Take 1 tablet (80 mg total) by mouth daily. 10/24/21   Deno Etienne, DO  furosemide (LASIX) 40 MG tablet Take 1 tablet (40 mg total) by mouth daily. 10/24/21   Deno Etienne, DO  losartan (COZAAR) 25 MG tablet Take 1 tablet (25 mg total) by mouth daily. 10/24/21   Deno Etienne, DO  metoprolol tartrate (LOPRESSOR) 25 MG tablet Take 1 tablet (25 mg total) by mouth 2 (two) times daily. 01/29/21   Cheryln Manly, NP  nicotine (NICODERM CQ - DOSED IN MG/24 HOURS) 14 mg/24hr patch Place 1 patch (14 mg total) onto the skin daily. 01/30/21   Cheryln Manly, NP  nitroGLYCERIN (NITROSTAT) 0.4 MG SL tablet Place 1 tablet (0.4 mg total) under the tongue every 5 (five) minutes x 3 doses as needed for chest pain. 01/29/21   Cheryln Manly, NP  diphenhydrAMINE (BENADRYL) 25 MG tablet Take 1 tablet (25 mg total) by mouth every 6 (six) hours. Patient not taking: Reported on 12/09/2015 06/06/15 03/03/20  Jola Schmidt, MD  fluticasone [facility]) 50 MCG/ACT nasal spray Place 2 sprays into both nostrils daily. Patient not taking: Reported on 12/09/2015 06/06/15 03/03/20  Jola Schmidt, MD       Allergies    Patient has no known allergies.    Review of Systems   Review of Systems  Physical Exam Updated Vital Signs BP (!) 148/99 (BP Location: Left Arm)   Pulse 85   Temp 97.7 F (36.5 C) (Oral)   Resp 16   Ht '6\' 2"'$  (1.88 m)   Wt 127 kg   SpO2 98%   BMI 35.95 kg/m  Physical Exam Vitals and nursing note reviewed.  Constitutional:      Appearance: He is well-developed.  HENT:     Head: Normocephalic and atraumatic.  Eyes:     Pupils: Pupils are equal, round, and reactive to light.  Neck:     Vascular: No JVD.  Cardiovascular:     Rate and Rhythm: Normal rate and regular rhythm.     Heart sounds: No murmur heard.    No friction rub. No gallop.  Pulmonary:     Effort: No respiratory distress.     Breath sounds: No wheezing.  Abdominal:     General: There is no distension.     Tenderness: There is no abdominal tenderness. There is no guarding or rebound.  Musculoskeletal:        General: Normal range of motion.     Cervical back: Normal range of motion and neck supple.  Skin:  Coloration: Skin is not pale.     Findings: No rash.  Neurological:     Mental Status: He is alert and oriented to person, place, and time.  Psychiatric:        Behavior: Behavior normal.     ED Results / Procedures / Treatments   Labs (all labs ordered are listed, but only abnormal results are displayed) Labs Reviewed - No data to display  EKG None  Radiology No results found.  Procedures Procedures    Medications Ordered in ED Medications - No data to display  ED Course/ Medical Decision Making/ A&P                           Medical Decision Making Risk Prescription drug management.   51 yo M with a chief complaint of needing his medications refilled.  It looks like the last time they refilled in the ED.  We will refill for them today.  Encouraged him to follow-up with his family doctor in the office.  6:47 AM:  I have discussed the  diagnosis/risks/treatment options with the patient.  Evaluation and diagnostic testing in the emergency department does not suggest an emergent condition requiring admission or immediate intervention beyond what has been performed at this time.  They will follow up with  PCP. We also discussed returning to the ED immediately if new or worsening sx occur. We discussed the sx which are most concerning (e.g., sudden worsening pain, fever, inability to tolerate by mouth) that necessitate immediate return. Medications administered to the patient during their visit and any new prescriptions provided to the patient are listed below.  Medications given during this visit Medications - No data to display   The patient appears reasonably screen and/or stabilized for discharge and I doubt any other medical condition or other [facility] requiring further screening, evaluation, or treatment in the ED at this time prior to discharge.          Final Clinical Impression(s) / ED Diagnoses Final diagnoses:  Primary hypertension    Rx / DC Orders ED Discharge Orders          Ordered    atorvastatin (LIPITOR) 80 MG tablet  Daily        10/24/21 0643    furosemide (LASIX) 40 MG tablet  Daily        10/24/21 0643    losartan (COZAAR) 25 MG tablet  Daily        10/24/21 [place], [place], DO 10/24/21 [id number]

## 2021-10-24 NOTE — Discharge Instructions (Signed)
Please follow-up with your family doctor in the office.  Continue take your medications as prescribed.

## 2021-10-24 NOTE — ED Notes (Signed)
Rx x 3 given  Written and verbal inst to pt  Verbalized an understanding  To home

## 2021-12-11 ENCOUNTER — Ambulatory Visit: Payer: Self-pay | Admitting: *Deleted

## 2021-12-11 NOTE — Telephone Encounter (Signed)
  Chief Complaint: Abdominal pain Symptoms: 10/10 at navel with golf size "Ball" protruding. Frequency: this AM Pertinent Negatives: Patient denies  Disposition: '[x]'$ ED /'[]'$ Urgent Care (no appt availability in office) / '[]'$ Appointment(In office/virtual)/ '[]'$  [place] [facility] [facility] Virtual [facility]/ '[]'$ Home Care/ '[]'$ Refused Recommended Disposition /'[]'$ [place] [facility]/ '[]'$  Follow-up with PCP Additional Notes: Advised ED, states will follow disposition. Care advise provided, pt verbalizes understanding.  No PCP Reason for Disposition  [1] SEVERE pain (e.g., excruciating) AND [2] present > 1 hour  Answer Assessment - Initial Assessment Questions 1. LOCATION: "Where does it hurt?"      At navel 2. RADIATION: "Does the pain shoot anywhere else?" (e.g., chest, back)     All over, back 3. ONSET: "When did the pain begin?" (Minutes, hours or days ago)      This AM 4. SUDDEN: "Gradual or sudden onset?"     Suddenly 5. PATTERN "Does the pain come and go, or is it constant?"    - If it comes and goes: "How long does it last?" "Do you have pain now?"     (Note: Comes and goes means the pain is intermittent. It goes away completely between bouts.)    - If constant: "Is it getting better, staying the same, or getting worse?"      (Note: Constant means the pain never goes away completely; most serious pain is constant and gets worse.)      Constant 6. SEVERITY: "How bad is the pain?"  (e.g., Scale 1-10; mild, moderate, or severe)    - MILD (1-3): Doesn't interfere with normal activities, abdomen soft and not tender to touch.     - MODERATE (4-7): Interferes with normal activities or awakens from sleep, abdomen tender to touch.     - SEVERE (8-10): Excruciating pain, doubled over, unable to do any normal activities.       10/10 7. RECURRENT SYMPTOM: "Have you ever had this type of stomach pain before?" If Yes, ask: "When was the last time?" and "What happened that time?"      no 8. CAUSE: "What do you think is  causing the stomach pain?"     Unsure 9. RELIEVING/AGGRAVATING FACTORS: "What makes it better or worse?" (e.g., antacids, bending or twisting motion, bowel movement)     NA 10. OTHER SYMPTOMS: "Do you have any other symptoms?" (e.g., back pain, diarrhea, fever, urination pain, vomiting)       "Ball over navel, bigger than golf ball."  Protocols used: Abdominal Pain - Male-A-AH

## 2022-01-30 ENCOUNTER — Emergency Department (HOSPITAL_BASED_OUTPATIENT_CLINIC_OR_DEPARTMENT_OTHER)
Admission: EM | Admit: 2022-01-30 | Discharge: 2022-01-30 | Disposition: A | Discharge status: Home / Self Care | Payer: Commercial Managed Care - HMO | Attending: Emergency Medicine | Admitting: Emergency Medicine

## 2022-01-30 ENCOUNTER — Encounter (HOSPITAL_BASED_OUTPATIENT_CLINIC_OR_DEPARTMENT_OTHER): Payer: Self-pay

## 2022-01-30 DIAGNOSIS — T148XXA Other injury of unspecified body region, initial encounter: Secondary | ICD-10-CM

## 2022-01-30 DIAGNOSIS — I110 Hypertensive heart disease with heart failure: Secondary | ICD-10-CM | POA: Insufficient documentation

## 2022-01-30 DIAGNOSIS — R030 Elevated blood-pressure reading, without diagnosis of hypertension: Secondary | ICD-10-CM

## 2022-01-30 DIAGNOSIS — I509 Heart failure, unspecified: Secondary | ICD-10-CM | POA: Diagnosis not present

## 2022-01-30 DIAGNOSIS — I2510 Atherosclerotic heart disease of native coronary artery without angina pectoris: Secondary | ICD-10-CM | POA: Diagnosis not present

## 2022-01-30 DIAGNOSIS — S199XXA Unspecified injury of neck, initial encounter: Secondary | ICD-10-CM | POA: Diagnosis present

## 2022-01-30 DIAGNOSIS — S46812A Strain of other muscles, fascia and tendons at shoulder and upper arm level, left arm, initial encounter: Secondary | ICD-10-CM | POA: Diagnosis not present

## 2022-01-30 MED ORDER — LOSARTAN POTASSIUM 25 MG PO TABS: 25.0000 mg | ORAL_TABLET | Freq: Every day | ORAL | 0 refills | Status: DC

## 2022-01-30 MED ORDER — FUROSEMIDE 40 MG PO TABS: 40.0000 mg | ORAL_TABLET | Freq: Every day | ORAL | 0 refills | Status: DC

## 2022-01-30 MED ORDER — METHOCARBAMOL 500 MG PO TABS: 500.0000 mg | ORAL_TABLET | Freq: Two times a day (BID) | ORAL | 0 refills | Status: DC
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
## 2022-01-30 NOTE — Discharge Instructions (Addendum)
At this time there does not appear to be the presence of an emergent medical condition, however there is always the potential for conditions to change. Please read and follow the below instructions.  Please return to the Emergency Department immediately for any new or worsening symptoms. Please be sure to follow up with your Primary Care Provider within one week regarding your visit today; please call their office to schedule an appointment even if you are feeling better for a follow-up visit. You may use the muscle relaxer Robaxin as prescribed to help with your symptoms.  Do not drive or operate heavy machinery while taking Robaxin as it will make you drowsy.  Do not drink alcohol or take other sedating medications while taking Robaxin as this will worsen side effects. Your blood pressure medication has been refilled today.  Please follow-up with your primary care provider for blood pressure recheck within the next week.   Please read the additional information packets attached to your discharge summary.  Go to the nearest Emergency Department immediately if: You have fever or chills You have shortness of breath. You have light-headedness or you faint. You have chest pain. You have these eye or vision changes: Sudden vision loss or double vision. Your eye suddenly turns red. The black center of your eye (pupil) is an odd shape or size.     You have any new/concerning or worsening of symptoms.  Do not take your medicine if  develop an itchy rash, swelling in your mouth or lips, or difficulty breathing; call 911 and seek immediate emergency medical attention if this occurs.  You may review your lab tests and imaging results in their entirety on your MyChart account.  Please discuss all results of fully with your primary care provider and other specialist at your follow-up visit.  Note: Portions of this text may have been transcribed using voice recognition software. Every effort was made to  ensure accuracy; however, inadvertent computerized transcription errors may still be present.

## 2022-02-02 ENCOUNTER — Encounter (HOSPITAL_BASED_OUTPATIENT_CLINIC_OR_DEPARTMENT_OTHER): Payer: Self-pay | Admitting: Emergency Medicine

## 2022-02-02 ENCOUNTER — Other Ambulatory Visit: Payer: Self-pay

## 2022-02-02 DIAGNOSIS — M546 Pain in thoracic spine: Secondary | ICD-10-CM | POA: Diagnosis not present
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
## 2022-02-03 ENCOUNTER — Emergency Department (HOSPITAL_BASED_OUTPATIENT_CLINIC_OR_DEPARTMENT_OTHER): Payer: Commercial Managed Care - HMO

## 2022-02-03 ENCOUNTER — Emergency Department (HOSPITAL_BASED_OUTPATIENT_CLINIC_OR_DEPARTMENT_OTHER)
Admission: EM | Admit: 2022-02-03 | Discharge: 2022-02-03 | Disposition: A | Discharge status: Home / Self Care | Payer: Commercial Managed Care - HMO | Attending: Emergency Medicine | Admitting: Emergency Medicine

## 2022-02-03 MED ORDER — LIDOCAINE 5 % EX PTCH: 1.0000 | MEDICATED_PATCH | CUTANEOUS | 0 refills | Status: DC

## 2022-02-03 MED ORDER — IBUPROFEN 800 MG PO TABS: 800.0000 mg | ORAL_TABLET | Freq: Once | ORAL | Status: DC

## 2022-02-03 MED ORDER — NAPROXEN 250 MG PO TABS
500.0000 mg | ORAL_TABLET | Freq: Once | ORAL | Status: DC
  Filled 2022-02-03: qty 2

## 2022-02-03 MED ORDER — LIDOCAINE 5 % EX PTCH
2.0000 | MEDICATED_PATCH | CUTANEOUS | Status: DC
  Administered 2022-02-03: 2 via TRANSDERMAL
  Filled 2022-02-03: qty 2
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
## 2022-05-11 ENCOUNTER — Emergency Department (HOSPITAL_COMMUNITY)
Admission: EM | Admit: 2022-05-11 | Discharge: 2022-05-11 | Disposition: A | Discharge status: Home / Self Care | Payer: Medicaid Other | Attending: Emergency Medicine | Admitting: Emergency Medicine

## 2022-05-11 DIAGNOSIS — Z7982 Long term (current) use of aspirin: Secondary | ICD-10-CM | POA: Insufficient documentation

## 2022-05-11 DIAGNOSIS — I110 Hypertensive heart disease with heart failure: Secondary | ICD-10-CM | POA: Insufficient documentation

## 2022-05-11 DIAGNOSIS — R600 Localized edema: Secondary | ICD-10-CM | POA: Insufficient documentation

## 2022-05-11 DIAGNOSIS — I2510 Atherosclerotic heart disease of native coronary artery without angina pectoris: Secondary | ICD-10-CM | POA: Diagnosis not present

## 2022-05-11 DIAGNOSIS — Z760 Encounter for issue of repeat prescription: Secondary | ICD-10-CM | POA: Diagnosis not present

## 2022-05-11 DIAGNOSIS — I509 Heart failure, unspecified: Secondary | ICD-10-CM | POA: Insufficient documentation

## 2022-05-11 DIAGNOSIS — Z79899 Other long term (current) drug therapy: Secondary | ICD-10-CM | POA: Diagnosis not present

## 2022-05-11 MED ORDER — LOSARTAN POTASSIUM 25 MG PO TABS: 25.0000 mg | ORAL_TABLET | Freq: Every day | ORAL | 0 refills | Status: AC

## 2022-05-11 MED ORDER — FUROSEMIDE 40 MG PO TABS: 40.0000 mg | ORAL_TABLET | Freq: Every day | ORAL | 0 refills | Status: DC

## 2022-05-11 NOTE — ED Notes (Signed)
Pt d/c home per MD order. Discharge summary reviewed, pt verbalizes understanding. Ambulatory off unit. No s/s of acute distress noted.  

## 2022-05-11 NOTE — ED Triage Notes (Signed)
Pt to ED requesting medication refill. Reports out of blood pressure medications x 2 weeks.

## 2022-05-11 NOTE — ED Provider Notes (Signed)
[facility] Provider Note   CSN: [id number] Arrival date & time: 05/11/22  1708     History  Chief Complaint  Patient presents with   Medication Refill    Clinton Johnson is a 52 y.o. male.   Medication Refill    Patient with medical history of CHF, hypertension, CAD presents to the emergency department requesting medication refill.  States he has been out of his blood pressure medicine and Lasix for the last 2 weeks, requesting medication refill.  Denies any chest pain or shortness of breath, feel that he is having some swelling in his legs bilaterally which has been worsening.  Home Medications Prior to Admission medications   Medication Sig Start Date End Date Taking? Authorizing Provider  aspirin 81 MG EC tablet Take 81 mg by mouth daily.    [provider]  atorvastatin (LIPITOR) 80 MG tablet Take 1 tablet (80 mg total) by mouth daily. 10/24/21   Deno Etienne, DO  furosemide (LASIX) 40 MG tablet Take 1 tablet (40 mg total) by mouth daily. 05/11/22   Sherrill Raring, PA-C  lidocaine (LIDODERM) 5 % Place 1 patch onto the skin daily. Remove & Discard patch within 12 hours or as directed by MD 02/03/22   Randal Buba, April, MD  losartan (COZAAR) 25 MG tablet Take 1 tablet (25 mg total) by mouth daily. 05/11/22   Sherrill Raring, PA-C  methocarbamol (ROBAXIN) 500 MG tablet Take 1 tablet (500 mg total) by mouth 2 (two) times daily. 01/30/22   Nuala Alpha A, PA-C  metoprolol tartrate (LOPRESSOR) 25 MG tablet Take 1 tablet (25 mg total) by mouth 2 (two) times daily. 01/29/21   Cheryln Manly, NP  nicotine (NICODERM CQ - DOSED IN MG/24 HOURS) 14 mg/24hr patch Place 1 patch (14 mg total) onto the skin daily. 01/30/21   Cheryln Manly, NP  nitroGLYCERIN (NITROSTAT) 0.4 MG SL tablet Place 1 tablet (0.4 mg total) under the tongue every 5 (five) minutes x 3 doses as needed for chest pain. 01/29/21   Cheryln Manly, NP  diphenhydrAMINE  (BENADRYL) 25 MG tablet Take 1 tablet (25 mg total) by mouth every 6 (six) hours. Patient not taking: Reported on 12/09/2015 06/06/15 03/03/20  Jola Schmidt, MD  fluticasone [facility]) 50 MCG/ACT nasal spray Place 2 sprays into both nostrils daily. Patient not taking: Reported on 12/09/2015 06/06/15 03/03/20  Jola Schmidt, MD      Allergies    Patient has no known allergies.    Review of Systems   Review of Systems  Physical Exam Updated Vital Signs BP (!) 143/90 (BP Location: Right Arm)   Pulse 89   Temp 98.2 F (36.8 C) (Oral)   Resp 18   Ht '6\' 2"'$  (1.88 m)   Wt 122.5 kg   SpO2 100%   BMI 34.67 kg/m  Physical Exam Vitals and nursing note reviewed. Exam conducted with a chaperone present.  Constitutional:      Appearance: Normal appearance.  HENT:     Head: Normocephalic and atraumatic.  Eyes:     General: No scleral icterus.       Right eye: No discharge.        Left eye: No discharge.     Extraocular Movements: Extraocular movements intact.     Pupils: Pupils are equal, round, and reactive to light.  Cardiovascular:     Rate and Rhythm: Normal rate and regular rhythm.     Pulses: Normal pulses.  Heart sounds: Normal heart sounds.     No friction rub. No gallop.  Pulmonary:     Effort: Pulmonary effort is normal. No respiratory distress.     Breath sounds: Normal breath sounds.     Comments: Speaking in complete pleat sentences, no Rales Abdominal:     General: Abdomen is flat. Bowel sounds are normal. There is no distension.     Palpations: Abdomen is soft.     Tenderness: There is no abdominal tenderness.  Musculoskeletal:     Right lower leg: Edema present.     Left lower leg: Edema present.     Comments: 1+ edema lower extremities bilaterally  Skin:    General: Skin is warm and dry.     Coloration: Skin is not jaundiced.  Neurological:     Mental Status: He is alert. Mental status is at baseline.     Coordination: Coordination normal.     ED Results /  Procedures / Treatments   Labs (all labs ordered are listed, but only abnormal results are displayed) Labs Reviewed - No data to display  EKG None  Radiology No results found.  Procedures Procedures    Medications Ordered in ED Medications - No data to display  ED Course/ Medical Decision Making/ A&P                             Medical Decision Making Risk Prescription drug management.   Patient presents for medication refill.  No other complaints, do not suspect CHF exacerbation, ACS, HTN emergency.  -BP (!) 143/90 (BP Location: Right Arm)   Pulse 89   Temp 98.2 F (36.8 C) (Oral)   Resp 18   Ht '6\' 2"'$  (1.88 m)   Wt 122.5 kg   SpO2 100%   BMI 34.67 kg/m   Do not think labs are indicated, medication refill sent to the patient as well as referral for primary care provider.  Stable for outpatient follow-up at this time.        Final Clinical Impression(s) / ED Diagnoses Final diagnoses:  Medication refill    Rx / DC Orders ED Discharge Orders          Ordered    furosemide (LASIX) 40 MG tablet  Daily        05/11/22 1730    losartan (COZAAR) 25 MG tablet  Daily        05/11/22 1730              Sherrill Raring, PA-C 05/11/22 [place], Ankit, MD 05/14/22 1535

## 2022-05-11 NOTE — Discharge Instructions (Signed)
Take your meds, follow up with PCP

## 2022-05-31 ENCOUNTER — Inpatient Hospital Stay (HOSPITAL_BASED_OUTPATIENT_CLINIC_OR_DEPARTMENT_OTHER)
Admission: EM | Admit: 2022-05-31 | Discharge: 2022-06-04 | DRG: 321 | Disposition: A | Discharge status: Home / Self Care | Payer: Medicaid Other | Attending: Internal Medicine | Admitting: Internal Medicine

## 2022-05-31 ENCOUNTER — Emergency Department (HOSPITAL_BASED_OUTPATIENT_CLINIC_OR_DEPARTMENT_OTHER): Payer: Medicaid Other

## 2022-05-31 ENCOUNTER — Inpatient Hospital Stay (HOSPITAL_COMMUNITY): Payer: Medicaid Other

## 2022-05-31 ENCOUNTER — Encounter (HOSPITAL_BASED_OUTPATIENT_CLINIC_OR_DEPARTMENT_OTHER): Payer: Self-pay | Admitting: Emergency Medicine

## 2022-05-31 ENCOUNTER — Other Ambulatory Visit: Payer: Self-pay

## 2022-05-31 DIAGNOSIS — I5023 Acute on chronic systolic (congestive) heart failure: Secondary | ICD-10-CM | POA: Diagnosis present

## 2022-05-31 DIAGNOSIS — I214 Non-ST elevation (NSTEMI) myocardial infarction: Principal | ICD-10-CM | POA: Diagnosis present

## 2022-05-31 DIAGNOSIS — R079 Chest pain, unspecified: Secondary | ICD-10-CM | POA: Diagnosis not present

## 2022-05-31 DIAGNOSIS — E785 Hyperlipidemia, unspecified: Secondary | ICD-10-CM | POA: Diagnosis present

## 2022-05-31 DIAGNOSIS — M546 Pain in thoracic spine: Secondary | ICD-10-CM | POA: Diagnosis present

## 2022-05-31 DIAGNOSIS — I472 Ventricular tachycardia, unspecified: Secondary | ICD-10-CM | POA: Diagnosis present

## 2022-05-31 DIAGNOSIS — N179 Acute kidney failure, unspecified: Secondary | ICD-10-CM | POA: Diagnosis present

## 2022-05-31 DIAGNOSIS — E661 Drug-induced obesity: Secondary | ICD-10-CM | POA: Diagnosis present

## 2022-05-31 DIAGNOSIS — Z91199 Patient's noncompliance with other medical treatment and regimen due to unspecified reason: Secondary | ICD-10-CM

## 2022-05-31 DIAGNOSIS — I252 Old myocardial infarction: Secondary | ICD-10-CM

## 2022-05-31 DIAGNOSIS — E66812 Obesity, class 2: Secondary | ICD-10-CM | POA: Diagnosis present

## 2022-05-31 DIAGNOSIS — J9601 Acute respiratory failure with hypoxia: Secondary | ICD-10-CM | POA: Diagnosis present

## 2022-05-31 DIAGNOSIS — Z9189 Other specified personal risk factors, not elsewhere classified: Secondary | ICD-10-CM | POA: Diagnosis not present

## 2022-05-31 DIAGNOSIS — E876 Hypokalemia: Secondary | ICD-10-CM | POA: Diagnosis present

## 2022-05-31 DIAGNOSIS — I4729 Other ventricular tachycardia: Secondary | ICD-10-CM | POA: Insufficient documentation

## 2022-05-31 DIAGNOSIS — I10 Essential (primary) hypertension: Secondary | ICD-10-CM | POA: Diagnosis not present

## 2022-05-31 DIAGNOSIS — Z720 Tobacco use: Secondary | ICD-10-CM | POA: Diagnosis present

## 2022-05-31 DIAGNOSIS — Z8249 Family history of ischemic heart disease and other diseases of the circulatory system: Secondary | ICD-10-CM

## 2022-05-31 DIAGNOSIS — I161 Hypertensive emergency: Secondary | ICD-10-CM | POA: Diagnosis present

## 2022-05-31 DIAGNOSIS — I5020 Unspecified systolic (congestive) heart failure: Secondary | ICD-10-CM | POA: Diagnosis not present

## 2022-05-31 DIAGNOSIS — Z6837 Body mass index (BMI) 37.0-37.9, adult: Secondary | ICD-10-CM

## 2022-05-31 DIAGNOSIS — I25110 Atherosclerotic heart disease of native coronary artery with unstable angina pectoris: Secondary | ICD-10-CM | POA: Diagnosis not present

## 2022-05-31 DIAGNOSIS — Z1152 Encounter for screening for COVID-19: Secondary | ICD-10-CM

## 2022-05-31 DIAGNOSIS — T502X5A Adverse effect of carbonic-anhydrase inhibitors, benzothiadiazides and other diuretics, initial encounter: Secondary | ICD-10-CM | POA: Diagnosis present

## 2022-05-31 DIAGNOSIS — F17210 Nicotine dependence, cigarettes, uncomplicated: Secondary | ICD-10-CM | POA: Diagnosis present

## 2022-05-31 DIAGNOSIS — E871 Hypo-osmolality and hyponatremia: Secondary | ICD-10-CM | POA: Diagnosis present

## 2022-05-31 DIAGNOSIS — I5043 Acute on chronic combined systolic (congestive) and diastolic (congestive) heart failure: Secondary | ICD-10-CM | POA: Diagnosis present

## 2022-05-31 DIAGNOSIS — I249 Acute ischemic heart disease, unspecified: Secondary | ICD-10-CM | POA: Diagnosis present

## 2022-05-31 DIAGNOSIS — I2510 Atherosclerotic heart disease of native coronary artery without angina pectoris: Secondary | ICD-10-CM | POA: Diagnosis present

## 2022-05-31 DIAGNOSIS — Z79899 Other long term (current) drug therapy: Secondary | ICD-10-CM

## 2022-05-31 DIAGNOSIS — Z91148 Patient's other noncompliance with medication regimen for other reason: Secondary | ICD-10-CM | POA: Diagnosis not present

## 2022-05-31 DIAGNOSIS — Z7982 Long term (current) use of aspirin: Secondary | ICD-10-CM | POA: Diagnosis not present

## 2022-05-31 DIAGNOSIS — I110 Hypertensive heart disease with heart failure: Secondary | ICD-10-CM | POA: Diagnosis present

## 2022-05-31 DIAGNOSIS — Z8679 Personal history of other diseases of the circulatory system: Secondary | ICD-10-CM | POA: Diagnosis not present

## 2022-05-31 DIAGNOSIS — J189 Pneumonia, unspecified organism: Secondary | ICD-10-CM | POA: Diagnosis present

## 2022-05-31 DIAGNOSIS — E782 Mixed hyperlipidemia: Secondary | ICD-10-CM | POA: Diagnosis not present

## 2022-05-31 DIAGNOSIS — I509 Heart failure, unspecified: Secondary | ICD-10-CM

## 2022-05-31 DIAGNOSIS — Z955 Presence of coronary angioplasty implant and graft: Secondary | ICD-10-CM

## 2022-05-31 DIAGNOSIS — Z91119 Patient's noncompliance with dietary regimen due to unspecified reason: Secondary | ICD-10-CM | POA: Diagnosis not present

## 2022-05-31 HISTORY — DX: Chronic systolic (congestive) heart failure: I50.22

## 2022-05-31 LAB — BASIC METABOLIC PANEL
Anion gap: 9 (ref 5–15)
Anion gap: 14 (ref 5–15)
BUN: 15 mg/dL (ref 6–20)
BUN: 11 mg/dL (ref 6–20)
CO2: 25 mmol/L (ref 22–32)
CO2: 24 mmol/L (ref 22–32)
Calcium: 8.9 mg/dL (ref 8.9–10.3)
Calcium: 9.2 mg/dL (ref 8.9–10.3)
Chloride: 103 mmol/L (ref 98–111)
Chloride: 102 mmol/L (ref 98–111)
Creatinine, Ser: 1.37 mg/dL — ABNORMAL HIGH (ref 0.61–1.24)
Creatinine, Ser: 1.18 mg/dL (ref 0.61–1.24)
GFR, Estimated: >60 mL/min (ref >60)
GFR, Estimated: >60 mL/min (ref >60)
Glucose, Bld: 127 mg/dL — ABNORMAL HIGH (ref 70–99)
Glucose, Bld: 110 mg/dL — ABNORMAL HIGH (ref 70–99)
Potassium: 3.1 mmol/L — ABNORMAL LOW (ref 3.5–5.1)
Potassium: 3.5 mmol/L (ref 3.5–5.1)
Sodium: 137 mmol/L (ref 135–145)
Sodium: 140 mmol/L (ref 135–145)

## 2022-05-31 LAB — CBC
HCT: 47.1 % (ref 39.0–52.0)
Hemoglobin: 15.2 g/dL (ref 13.0–17.0)
MCH: 27.8 pg (ref 26.0–34.0)
MCHC: 32.3 g/dL (ref 30.0–36.0)
MCV: 86.3 fL (ref 80.0–100.0)
Platelets: 267 10*3/uL (ref 150–400)
RBC: 5.46 MIL/uL (ref 4.22–5.81)
RDW: 16.5 % — ABNORMAL HIGH (ref 11.5–15.5)
WBC: 7.6 10*3/uL (ref 4.0–10.5)
nRBC: 0 % (ref 0.0–0.2)

## 2022-05-31 LAB — RAPID URINE DRUG SCREEN, HOSP PERFORMED
Amphetamines: NOT DETECTED
Barbiturates: NOT DETECTED
Benzodiazepines: NOT DETECTED
Cocaine: NOT DETECTED
Opiates: POSITIVE — AB
Tetrahydrocannabinol: NOT DETECTED

## 2022-05-31 LAB — I-STAT VENOUS BLOOD GAS, ED
Acid-Base Excess: 3 mmol/L — ABNORMAL HIGH (ref 0.0–2.0)
Bicarbonate: 29.7 mmol/L — ABNORMAL HIGH (ref 20.0–28.0)
Calcium, Ion: 1.16 mmol/L (ref 1.15–1.40)
HCT: 50 % (ref 39.0–52.0)
Hemoglobin: 17 g/dL (ref 13.0–17.0)
O2 Saturation: 20 %
Patient temperature: 98.3
Potassium: 6.1 mmol/L — ABNORMAL HIGH (ref 3.5–5.1)
Sodium: 140 mmol/L (ref 135–145)
TCO2: 31 mmol/L (ref 22–32)
pCO2, Ven: 52.5 mmHg (ref 44–60)
pH, Ven: 7.36 (ref 7.25–7.43)
pO2, Ven: 16 mmHg — CL (ref 32–45)

## 2022-05-31 LAB — HIV ANTIBODY (ROUTINE TESTING W REFLEX): HIV Screen 4th Generation wRfx: NONREACTIVE

## 2022-05-31 LAB — BRAIN NATRIURETIC PEPTIDE: B Natriuretic Peptide: 223.1 pg/mL — ABNORMAL HIGH (ref 0.0–100.0)

## 2022-05-31 LAB — HEPARIN LEVEL (UNFRACTIONATED)
Heparin Unfractionated: 0.39 IU/mL (ref 0.30–0.70)
Heparin Unfractionated: 0.31 IU/mL (ref 0.30–0.70)

## 2022-05-31 LAB — ECHOCARDIOGRAM COMPLETE
AR max vel: 3.21 cm2
AV Area VTI: 3.18 cm2
AV Area mean vel: 2.82 cm2
AV Mean grad: 1 mmHg
AV Peak grad: 2.5 mmHg
Ao pk vel: 0.8 m/s
Area-P 1/2: 5.97 cm2
Height: 74 in
S' Lateral: 4.5 cm
Weight: 4897.6 oz

## 2022-05-31 LAB — SARS CORONAVIRUS 2 BY RT PCR: SARS Coronavirus 2 by RT PCR: NEGATIVE

## 2022-05-31 LAB — TSH: TSH: 4.506 u[IU]/mL — ABNORMAL HIGH (ref 0.350–4.500)

## 2022-05-31 LAB — TROPONIN I (HIGH SENSITIVITY)
Troponin I (High Sensitivity): 316 ng/L (ref <18)
Troponin I (High Sensitivity): 541 ng/L (ref <18)
Troponin I (High Sensitivity): 3215 ng/L (ref <18)
Troponin I (High Sensitivity): 11627 ng/L (ref <18)

## 2022-05-31 LAB — LIPID PANEL
Cholesterol: 264 mg/dL — ABNORMAL HIGH (ref 0–200)
HDL: 43 mg/dL (ref >40)
LDL Cholesterol: 188 mg/dL — ABNORMAL HIGH (ref 0–99)
Total CHOL/HDL Ratio: 6.1 RATIO
Triglycerides: 163 mg/dL — ABNORMAL HIGH (ref <150)
VLDL: 33 mg/dL (ref 0–40)

## 2022-05-31 LAB — MAGNESIUM: Magnesium: 2.2 mg/dL (ref 1.7–2.4)

## 2022-05-31 MED ORDER — SODIUM CHLORIDE 0.9% FLUSH
3.0000 mL | Freq: Two times a day (BID) | INTRAVENOUS | Status: DC
  Administered 2022-05-31 – 2022-06-03 (×5): 3 mL via INTRAVENOUS

## 2022-05-31 MED ORDER — HYDRALAZINE HCL 20 MG/ML IJ SOLN: 5.0000 mg | INTRAMUSCULAR | Status: DC | PRN

## 2022-05-31 MED ORDER — BISACODYL 5 MG PO TBEC: 5.0000 mg | DELAYED_RELEASE_TABLET | Freq: Every day | ORAL | Status: DC | PRN

## 2022-05-31 MED ORDER — ASPIRIN 81 MG PO CHEW
324.0000 mg | CHEWABLE_TABLET | Freq: Once | ORAL | Status: AC
  Administered 2022-05-31: 324 mg via ORAL
  Filled 2022-05-31: qty 4

## 2022-05-31 MED ORDER — HEPARIN (PORCINE) 25000 UT/250ML-% IV SOLN
1900.0000 [IU]/h | INTRAVENOUS | Status: DC
  Administered 2022-05-31 (×2): 1600 [IU]/h via INTRAVENOUS
  Administered 2022-06-01 – 2022-06-02 (×2): 1900 [IU]/h via INTRAVENOUS
  Filled 2022-05-31 (×4): qty 250

## 2022-05-31 MED ORDER — OXYCODONE HCL 5 MG PO TABS: 5.0000 mg | ORAL_TABLET | ORAL | Status: DC | PRN

## 2022-05-31 MED ORDER — ACETAMINOPHEN 325 MG PO TABS: 650.0000 mg | ORAL_TABLET | Freq: Four times a day (QID) | ORAL | Status: DC | PRN

## 2022-05-31 MED ORDER — FUROSEMIDE 10 MG/ML IJ SOLN
40.0000 mg | INTRAMUSCULAR | Status: AC
  Administered 2022-05-31: 40 mg via INTRAVENOUS
  Filled 2022-05-31: qty 4

## 2022-05-31 MED ORDER — FUROSEMIDE 10 MG/ML IJ SOLN
40.0000 mg | Freq: Once | INTRAMUSCULAR | Status: AC
  Administered 2022-05-31: 40 mg via INTRAVENOUS
  Filled 2022-05-31: qty 4

## 2022-05-31 MED ORDER — MORPHINE SULFATE (PF) 4 MG/ML IV SOLN
4.0000 mg | Freq: Once | INTRAVENOUS | Status: AC
  Administered 2022-05-31: 4 mg via INTRAVENOUS
  Filled 2022-05-31: qty 1

## 2022-05-31 MED ORDER — ACETAMINOPHEN 650 MG RE SUPP: 650.0000 mg | Freq: Four times a day (QID) | RECTAL | Status: DC | PRN

## 2022-05-31 MED ORDER — POLYETHYLENE GLYCOL 3350 17 G PO PACK: 17.0000 g | PACK | Freq: Every day | ORAL | Status: DC | PRN

## 2022-05-31 MED ORDER — POTASSIUM CHLORIDE CRYS ER 20 MEQ PO TBCR
20.0000 meq | EXTENDED_RELEASE_TABLET | Freq: Every day | ORAL | Status: DC
  Administered 2022-05-31 – 2022-06-03 (×4): 20 meq via ORAL
  Filled 2022-05-31 (×4): qty 1

## 2022-05-31 MED ORDER — FUROSEMIDE 10 MG/ML IJ SOLN
40.0000 mg | Freq: Two times a day (BID) | INTRAMUSCULAR | Status: DC
  Administered 2022-05-31 – 2022-06-02 (×4): 40 mg via INTRAVENOUS
  Filled 2022-05-31 (×4): qty 4

## 2022-05-31 MED ORDER — MORPHINE SULFATE (PF) 2 MG/ML IV SOLN: 2.0000 mg | INTRAVENOUS | Status: DC | PRN

## 2022-05-31 MED ORDER — NITROGLYCERIN 2 % TD OINT
1.0000 [in_us] | TOPICAL_OINTMENT | Freq: Once | TRANSDERMAL | Status: AC
  Administered 2022-05-31: 1 [in_us] via TOPICAL
  Filled 2022-05-31: qty 1

## 2022-05-31 MED ORDER — ATORVASTATIN CALCIUM 80 MG PO TABS
80.0000 mg | ORAL_TABLET | Freq: Every day | ORAL | Status: DC
  Administered 2022-05-31 – 2022-06-04 (×5): 80 mg via ORAL
  Filled 2022-05-31 (×5): qty 1

## 2022-05-31 MED ORDER — NITROGLYCERIN IN D5W 200-5 MCG/ML-% IV SOLN
0.0000 ug/min | INTRAVENOUS | Status: DC
  Administered 2022-05-31: 5 ug/min via INTRAVENOUS
  Filled 2022-05-31: qty 250

## 2022-05-31 MED ORDER — ASPIRIN 81 MG PO TBEC
81.0000 mg | DELAYED_RELEASE_TABLET | Freq: Every day | ORAL | Status: DC
  Administered 2022-06-01: 81 mg via ORAL
  Filled 2022-05-31: qty 1

## 2022-05-31 MED ORDER — METOPROLOL TARTRATE 25 MG PO TABS
25.0000 mg | ORAL_TABLET | Freq: Two times a day (BID) | ORAL | Status: DC
  Administered 2022-05-31 – 2022-06-02 (×5): 25 mg via ORAL
  Filled 2022-05-31 (×7): qty 1

## 2022-05-31 MED ORDER — HEPARIN BOLUS VIA INFUSION
4000.0000 [IU] | Freq: Once | INTRAVENOUS | Status: AC
  Administered 2022-05-31: 4000 [IU] via INTRAVENOUS

## 2022-05-31 MED ORDER — DOCUSATE SODIUM 100 MG PO CAPS
100.0000 mg | ORAL_CAPSULE | Freq: Two times a day (BID) | ORAL | Status: DC
  Administered 2022-05-31 – 2022-06-03 (×3): 100 mg via ORAL
  Filled 2022-05-31 (×6): qty 1

## 2022-05-31 MED ORDER — ONDANSETRON HCL 4 MG PO TABS: 4.0000 mg | ORAL_TABLET | Freq: Four times a day (QID) | ORAL | Status: DC | PRN

## 2022-05-31 MED ORDER — FUROSEMIDE 10 MG/ML IJ SOLN: 40.0000 mg | Freq: Two times a day (BID) | INTRAMUSCULAR | Status: DC

## 2022-05-31 MED ORDER — POTASSIUM CHLORIDE CRYS ER 20 MEQ PO TBCR
40.0000 meq | EXTENDED_RELEASE_TABLET | Freq: Once | ORAL | Status: AC
  Administered 2022-05-31: 40 meq via ORAL
  Filled 2022-05-31: qty 2

## 2022-05-31 MED ORDER — ONDANSETRON HCL 4 MG/2ML IJ SOLN: 4.0000 mg | Freq: Four times a day (QID) | INTRAMUSCULAR | Status: DC | PRN

## 2022-05-31 MED ORDER — NICOTINE 14 MG/24HR TD PT24
14.0000 mg | MEDICATED_PATCH | Freq: Every day | TRANSDERMAL | Status: DC
  Filled 2022-05-31: qty 1

## 2022-05-31 NOTE — H&P (Addendum)
History and Physical    Patient: Clinton Johnson [id number] DOB: [birth year] DOA: 05/31/2022 DOS: the patient was seen and examined on 05/31/2022 PCP: Patient, No Pcp Per  Patient coming from: Home - lives with cousin; NOK: Aunt (no number listed, patient requested that I not call)   Chief Complaint: CP/SOB  HPI: Clinton Johnson is a 52 y.o. male with medical history significant of CAD s/p stents, HTN, and chronic systolic CHF (EF 123456) presenting with CP/SOB. Patient reports missing 3 days of medications.  He also ate a high salt meal at [employer].  He was watching basketball last night and was breathing heavily, feeling SOB.  He went to bed and awoke about midnight with R shoulder pain that radiated into his substernal region as well as his back.  He could feel the crackles in his lungs, + cough.  Chest pain is improved, NTG patch in place.  He reports that last night's chest pain was not similar to index symptoms.  Prior NSTEMI was in [place] in 2020.  Stent placed in proximal to mid LAD.  Echo 01/27/21 with EF 35-40% with findings concerned for LAD infarction, ?LV thrombus (not seen on limited echo the following day), grade 2 diastolic dysfunction.  Most recent cath was 01/28/21 with widely patent stent.  He was noted to have a 70% stenosed ramus lesion, 30% proximal RCA lesion, and 60% stenosis of proximal to mid RCA.  He was recommended to have medical management for CAD and CHF as well as smoking cessation.  He appears to have been lost to f/u since.    ER Course:  [facility], per Dr. Sidney Ace:  Shortness of breath that woke him up abruptly from sleep.  He denies any fevers or chills but has some tightness in his chest and congestion.  Compliant with Lasix.   BP 145/109, pulse 110 and respiratory rate 31, 81-86% on room air and 94% on high flow nasal cannula.   VBG with pH 7.36 and HCO3 of 29.7, potassium 3.1, high sensitive troponin I of 316 and BNP of 223.1 with unremarkable  CBC.  COVID-19 negative   Portable chest x-ray showed diffuse hazy opacity in the lungs bilaterally with patchy airspace disease at the lung bases, possible edema versus pneumonitis.   EKG showed sinus tachycardia with a rate of 108 with left atrial enlargement and ST segment depression in V3 and inferiorly.   The patient was given 4 baby aspirin, 40 mg of IV Lasix, 4 mg of IV morphine sulfate, 1 inch of Nitropaste, IV heparin bolus and drip.   Contact was made with Dr. Ellyn Hack without and that this is not consistent with STEMI and later Dr. Koleen Nimrod.     Review of Systems: As mentioned in the history of present illness. All other systems reviewed and are negative. Past Medical History:  Diagnosis Date   Chronic systolic (congestive) heart failure ([place])    Coronary artery disease    Hypertension    Past Surgical History:  Procedure Laterality Date   ABDOMINAL SURGERY     INTRAVASCULAR PRESSURE WIRE/FFR STUDY N/A 01/28/2021   Procedure: INTRAVASCULAR PRESSURE WIRE/FFR STUDY;  Surgeon: Lorretta Harp, MD;  Location: [facility];  Service: Cardiovascular;  Laterality: N/A;   LEFT HEART CATH AND CORONARY ANGIOGRAPHY N/A 01/28/2021   Procedure: LEFT HEART CATH AND CORONARY ANGIOGRAPHY;  Surgeon: Lorretta Harp, MD;  Location: [facility];  Service: Cardiovascular;  Laterality: N/A;   Social History:  reports  that he has been smoking cigarettes. He has been smoking an average of 1 pack per day. He has never used smokeless tobacco. He reports current alcohol use. He reports that he does not use drugs.  No Known Allergies  Family History  Problem Relation Age of Onset   Heart attack Mother     Prior to Admission medications   Medication Sig Start Date End Date Taking? Authorizing Provider  aspirin 81 MG EC tablet Take 81 mg by mouth daily.    [provider]  atorvastatin (LIPITOR) 80 MG tablet Take 1 tablet (80 mg total) by mouth daily. 10/24/21   Deno Etienne, DO  furosemide (LASIX) 40 MG tablet Take 1 tablet (40 mg total) by mouth daily. 05/11/22   Sherrill Raring, PA-C  lidocaine (LIDODERM) 5 % Place 1 patch onto the skin daily. Remove & Discard patch within 12 hours or as directed by MD 02/03/22   Randal Buba, April, MD  losartan (COZAAR) 25 MG tablet Take 1 tablet (25 mg total) by mouth daily. 05/11/22   Sherrill Raring, PA-C  methocarbamol (ROBAXIN) 500 MG tablet Take 1 tablet (500 mg total) by mouth 2 (two) times daily. 01/30/22   Nuala Alpha A, PA-C  metoprolol tartrate (LOPRESSOR) 25 MG tablet Take 1 tablet (25 mg total) by mouth 2 (two) times daily. 01/29/21   Cheryln Manly, NP  nicotine (NICODERM CQ - DOSED IN MG/24 HOURS) 14 mg/24hr patch Place 1 patch (14 mg total) onto the skin daily. 01/30/21   Cheryln Manly, NP  nitroGLYCERIN (NITROSTAT) 0.4 MG SL tablet Place 1 tablet (0.4 mg total) under the tongue every 5 (five) minutes x 3 doses as needed for chest pain. 01/29/21   Cheryln Manly, NP  diphenhydrAMINE (BENADRYL) 25 MG tablet Take 1 tablet (25 mg total) by mouth every 6 (six) hours. Patient not taking: Reported on 12/09/2015 06/06/15 03/03/20  Jola Schmidt, MD  fluticasone [facility]) 50 MCG/ACT nasal spray Place 2 sprays into both nostrils daily. Patient not taking: Reported on 12/09/2015 06/06/15 03/03/20  Jola Schmidt, MD    Physical Exam: Vitals:   05/31/22 0600 05/31/22 0752 05/31/22 0755 05/31/22 0844  BP: (!) 124/98 (!) 132/99  (!) 149/111  Pulse: 89 94  98  Resp: (!) 28   19  Temp:   98.3 F (36.8 C) 97.9 F (36.6 C)  TempSrc:   Oral Oral  SpO2: 97% 100%  91%  Weight:      Height:       General:  Appears calm and comfortable and is in NAD, sitting up at the bedside Eyes:  EOMI, normal lids, iris ENT:  grossly normal hearing, lips & tongue, mmm Neck:  no LAD, masses or thyromegaly Cardiovascular:  RRR, no m/r/g. No LE edema.  Respiratory:   Crackles extending most of the way up the lung fields, mildly increased  respiratory effort. Abdomen:  soft, NT, ND Skin:  no rash or induration seen on limited exam Musculoskeletal:  grossly normal tone BUE/BLE, good ROM, no bony abnormality Psychiatric:  blunted mood and affect, speech fluent and appropriate, AOx3 Neurologic:  CN 2-12 grossly intact, moves all extremities in coordinated fashion   Radiological Exams on Admission: Independently reviewed - see discussion in A/P where applicable  DG Chest Port 1 View  Result Date: 05/31/2022 CLINICAL DATA:  Shortness of breath. EXAM: PORTABLE CHEST 1 VIEW COMPARISON:  01/26/2021. FINDINGS: The heart size and mediastinal contours are within normal limits. Diffuse hazy airspace opacities are  noted in the lungs bilaterally with patchy airspace disease at the lung bases. No effusion or pneumothorax. No acute osseous abnormality. IMPRESSION: Diffuse hazy opacities in the lungs bilaterally with patchy airspace disease at the lung bases, possible edema versus pneumonitis. Electronically Signed   By: Brett Fairy M.D.   On: 05/31/2022 04:19    EKG: Independently reviewed.   0346 - Sinus tachycardia with rate 108; marked ST changes consistent with acute ischemia but not STEMI 0403 - NSR with rate 99; persistent but improved ischemia   Labs on Admission: I have personally reviewed the available labs and imaging studies at the time of the admission.  Pertinent labs:    Unremarkable VBG K+ 3.1 Glucose 127 BUN 15/Creatinine 1.37/GFR >60 - stable BNP 223.1 HS troponin 316, 541 Normal CBC COVID negative   Assessment and Plan: Principal Problem:   NSTEMI (non-ST elevated myocardial infarction) ([place]) Active Problems:   Tobacco use   Primary hypertension   HFrEF (heart failure with reduced ejection fraction) ([place])   Hyperlipidemia    NSTEMI -Patient with SOB and substernal chest pain that came on overnight after patient with medical and dietary non-compliance. -2/3 features suggestive of atypical angina,  although he also has a CHF exacerbation. -CXR with mild pulmonary edema.  -Initial HS troponin elevated; repeat with positive delta.   -EKG with anteroinferior ST depression concerning for acute ischemia. -He is also having recurrent short runs of vtach -Will admit since the patient has positive troponins and/or an abnormal EKG with angina necessitating acute intervention. -Risk factor stratification with HgbA1c and FLP; will also check TSH and UDS -Cardiology consultation requested -Patient appears likely to need invasive evaluation (cardiac catheterization) based on concerning history, pertinent symptoms, hemodynamic instability, ischemic EKG, elevated troponin  -Start ASA 81 mg daily -NTG for symptom relief (although there is no mortality benefit) -Needs beta blocker but currently decompensated from heart failure so will hold (or defer to cardiology) -Patient is on a Heparin drip  -morphine given -Supplemental O2 -Start Lipitor 40 mg qhs for now  Acute on chronic combined CHF -Patient with known h/o chronic combined CHF presenting with worsening SOB and chest pain -CXR consistent with mild pulmonary edema -Mildly elevated BNP but lower than prior -With elevated BNP and abnl CXR, acute decompensated CHF seems probable as contributing to diagnosis -Will request echocardiogram -Will continue ASA -CHF order set utilized -Was given Lasix 40 mg x 1 in ER and will repeat with 40 mg IV BID, first dose now -Continue [place] O2 for now -Normal kidney function at this time, will follow  HTN -Metoprolol resumed by cardiology -Hold losartan fr now -Will also add prn hydralazine  HLD -Continue Lipitor -Check lipids  Tobacco dependence -Encourage cessation.   -This was discussed with the patient and should be reviewed on an ongoing basis.   -Patch ordered for prn use     Advance Care Planning:   Code Status: Full Code - Code status was discussed with the patient and/or family at the time  of admission.  The patient would want to receive full resuscitative measures at this time.   Consults: Cardiology; CHF navigator; TOC team  DVT Prophylaxis: Heparin drip  Family Communication: None present; patient declined to have me call family at the time of admission  Severity of Illness: The appropriate patient status for this patient is INPATIENT. Inpatient status is judged to be reasonable and necessary in order to provide the required intensity of service to ensure the patient's safety. The  patient's presenting symptoms, physical exam findings, and initial radiographic and laboratory data in the context of their chronic comorbidities is felt to place them at high risk for further clinical deterioration. Furthermore, it is not anticipated that the patient will be medically stable for discharge from the hospital within 2 midnights of admission.   * I certify that at the point of admission it is my clinical judgment that the patient will require inpatient hospital care spanning beyond 2 midnights from the point of admission due to high intensity of service, high risk for further deterioration and high frequency of surveillance required.*  Author: Karmen Bongo, MD 05/31/2022 11:01 AM  For on call review [email or web address].

## 2022-05-31 NOTE — ED Triage Notes (Signed)
Pt states woke up with back and chest pain about 2 hours ago. Then developed SOB.  Hx of CHF.

## 2022-05-31 NOTE — Progress Notes (Signed)
[facility] for heparin Indication: chest pain/ACS  No Known Allergies  Patient Measurements: Height: 6\' 2"  (188 cm) Weight: (!) 138.8 kg (306 lb 1.6 oz) IBW/kg (Calculated) : 82.2 Heparin Dosing Weight: 115kg  Vital Signs: Temp: 97.9 F (36.6 C) (03/30 0844) Temp Source: Oral (03/30 0844) BP: 149/111 (03/30 0844) Pulse Rate: 98 (03/30 0844)  Labs: Recent Labs    05/31/22 0355 05/31/22 0551 05/31/22 1105  HGB 15.2  17.0  --   --   HCT 47.1  50.0  --   --   PLT 267  --   --   HEPARINUNFRC  --   --  0.39  CREATININE 1.37*  --   --   TROPONINIHS 316* 541*  --      Estimated Creatinine Clearance: 91.4 mL/min (A) (by C-G formula based on SCr of 1.37 mg/dL (H)).   Medical History: Past Medical History:  Diagnosis Date   Chronic systolic (congestive) heart failure (HCC)    Coronary artery disease    Hypertension     Assessment: 52yo male c/o chest and back pain x2h followed by SOB, initial troponin elevated. Pharmacy consulted to begin heparin. Possible cath on Monday.   Heparin level therapeutic s/p 4000 unit heparin bolus and on heparin gtt 1600 units/hr. Hgb/plt wnl. No issues with infusion or bleeding per RN.  Goal of Therapy:  Heparin level 0.3-0.7 units/ml Monitor platelets by anticoagulation protocol: Yes   Plan:  Continue IV heparin 1600 units/hr Check 6 hr heparin level (confirmatory)  Monitor heparin levels and CBC daily Monitor for s/sx of bleeding  F/u cards plans   Francena Hanly, PharmD Pharmacy Resident  05/31/2022 11:56 AM

## 2022-05-31 NOTE — Progress Notes (Signed)
ANTICOAGULATION CONSULT NOTE - Initial Consult  Pharmacy Consult for heparin Indication: chest pain/ACS  No Known Allergies  Patient Measurements: Height: 6\' 2"  (188 cm) Weight: (!) 138.8 kg (306 lb 1.6 oz) IBW/kg (Calculated) : 82.2 Heparin Dosing Weight: 115kg  Vital Signs: Temp: 98.7 F (37.1 C) (03/30 0414) Temp Source: Oral (03/30 0414) BP: 124/99 (03/30 0500) Pulse Rate: 83 (03/30 0500)  Labs: Recent Labs    05/31/22 0355  HGB 15.2  17.0  HCT 47.1  50.0  PLT 267  CREATININE 1.37*  TROPONINIHS 316*    Estimated Creatinine Clearance: 91.4 mL/min (A) (by C-G formula based on SCr of 1.37 mg/dL (H)).   Medical History: Past Medical History:  Diagnosis Date   CHF (congestive heart failure) (HCC)    Coronary artery disease    Hypertension     Assessment: 52yo male c/o chest and back pain x2h followed by SOB, initial troponin elevated >> to begin heparin.  Goal of Therapy:  Heparin level 0.3-0.7 units/ml Monitor platelets by anticoagulation protocol: Yes   Plan:  Heparin 4000 units IV bolus x1 followed by infusion at 1600 units/hr. Monitor heparin levels and CBC.  Wynona Neat, PharmD, BCPS  05/31/2022,5:17 AM

## 2022-05-31 NOTE — Progress Notes (Signed)
Echocardiogram 2D Echocardiogram has been performed.  Clinton Johnson 05/31/2022, 2:26 PM

## 2022-05-31 NOTE — ED Provider Notes (Signed)
[facility] [facility] Provider Note   CSN: [id number] Arrival date & time: 05/31/22  [id number]     History  Chief Complaint  Patient presents with   Shortness of Breath    Clinton Johnson is a 52 y.o. male.  Patient is a 52 year old male with past medical history of coronary artery disease with stents, CHF with EF of 35 to 40%, hypertension, hyperlipidemia.  Patient presenting today with complaints of shortness of breath.  This woke him up abruptly from sleep.  He denies any fevers or chills.  He describes some tightness in his chest and congestion.  He reports being compliant with his Lasix.  The history is provided by the patient.       Home Medications Prior to Admission medications   Medication Sig Start Date End Date Taking? Authorizing Provider  aspirin 81 MG EC tablet Take 81 mg by mouth daily.    [provider]  atorvastatin (LIPITOR) 80 MG tablet Take 1 tablet (80 mg total) by mouth daily. 10/24/21   Deno Etienne, DO  furosemide (LASIX) 40 MG tablet Take 1 tablet (40 mg total) by mouth daily. 05/11/22   Sherrill Raring, PA-C  lidocaine (LIDODERM) 5 % Place 1 patch onto the skin daily. Remove & Discard patch within 12 hours or as directed by MD 02/03/22   Randal Buba, April, MD  losartan (COZAAR) 25 MG tablet Take 1 tablet (25 mg total) by mouth daily. 05/11/22   Sherrill Raring, PA-C  methocarbamol (ROBAXIN) 500 MG tablet Take 1 tablet (500 mg total) by mouth 2 (two) times daily. 01/30/22   Nuala Alpha A, PA-C  metoprolol tartrate (LOPRESSOR) 25 MG tablet Take 1 tablet (25 mg total) by mouth 2 (two) times daily. 01/29/21   Cheryln Manly, NP  nicotine (NICODERM CQ - DOSED IN MG/24 HOURS) 14 mg/24hr patch Place 1 patch (14 mg total) onto the skin daily. 01/30/21   Cheryln Manly, NP  nitroGLYCERIN (NITROSTAT) 0.4 MG SL tablet Place 1 tablet (0.4 mg total) under the tongue every 5 (five) minutes x 3 doses as needed for chest pain. 01/29/21    Cheryln Manly, NP  diphenhydrAMINE (BENADRYL) 25 MG tablet Take 1 tablet (25 mg total) by mouth every 6 (six) hours. Patient not taking: Reported on 12/09/2015 06/06/15 03/03/20  Jola Schmidt, MD  fluticasone [facility]) 50 MCG/ACT nasal spray Place 2 sprays into both nostrils daily. Patient not taking: Reported on 12/09/2015 06/06/15 03/03/20  Jola Schmidt, MD      Allergies    Patient has no known allergies.    Review of Systems   Review of Systems  All other systems reviewed and are negative.   Physical Exam Updated Vital Signs BP (!) 145/109 (BP Location: Right Arm)   Pulse (!) 104   Temp 98.3 F (36.8 C)   Resp (!) 25   Ht 6\' 2"  (1.88 m)   Wt (!) 138.8 kg   SpO2 92%   BMI 39.30 kg/m  Physical Exam Vitals and nursing note reviewed.  Constitutional:      General: He is not in acute distress.    Appearance: He is well-developed. He is not diaphoretic.  HENT:     Head: Normocephalic and atraumatic.  Cardiovascular:     Rate and Rhythm: Normal rate and regular rhythm.     Heart sounds: No murmur heard.    No friction rub.  Pulmonary:     Effort: Tachypnea, accessory muscle usage and  respiratory distress present.     Breath sounds: Examination of the right-middle field reveals rales. Examination of the left-middle field reveals rales. Examination of the right-lower field reveals rales. Examination of the left-lower field reveals rales. Rales present. No wheezing.     Comments: Patient arrives here in mild to moderate respiratory distress.  He is tachypneic and using accessory muscles. Abdominal:     General: Bowel sounds are normal. There is no distension.     Palpations: Abdomen is soft.     Tenderness: There is no abdominal tenderness.  Musculoskeletal:        General: Normal range of motion.     Cervical back: Normal range of motion and neck supple.     Right lower leg: Edema present.     Left lower leg: Edema present.     Comments: There is trace lower extremity  edema.  Skin:    General: Skin is warm and dry.  Neurological:     Mental Status: He is alert and oriented to person, place, and time.     Coordination: Coordination normal.     ED Results / Procedures / Treatments   Labs (all labs ordered are listed, but only abnormal results are displayed) Labs Reviewed  SARS CORONAVIRUS 2 BY RT PCR  BASIC METABOLIC PANEL  CBC  BRAIN NATRIURETIC PEPTIDE  TROPONIN I (HIGH SENSITIVITY)    EKG EKG Interpretation  Date/Time:  Saturday May 31 2022 04:03:31 EDT Ventricular Rate:  102 PR Interval:  174 QRS Duration: 94 QT Interval:  390 QTC Calculation: 508 R Axis:   -15 Text Interpretation: Sinus tachycardia Atrial premature complex Left atrial enlargement Borderline left axis deviation Confirmed by Veryl Speak [phone]) on 05/31/2022 5:51:32 AM  Radiology No results found.  Procedures Procedures    Medications Ordered in ED Medications  furosemide (LASIX) injection 40 mg (has no administration in time range)  nitroGLYCERIN (NITROGLYN) 2 % ointment 1 inch (has no administration in time range)    ED Course/ Medical Decision Making/ A&P  Patient is a 52 year old male with history of coronary artery disease with prior stenting.  Patient presenting today with complaints of shortness of breath that started acutely while he was sleeping.  Patient arrives here in mild to moderate respiratory distress.  His oxygen saturations are in the mid to upper 80s on room air.  Workup initiated including CBC, metabolic panel, BNP, and troponin.  He has returned with a troponin of 316 and BNP of 220, but laboratory studies otherwise unremarkable.  Chest x-ray performed and is consistent with congestive heart failure.  Patient has received IV Lasix for diuresis, morphine for his pain, and aspirin.  When his troponin returned elevated, he was started on heparin.  When patient arrived to the ED, his initial EKG was showing changes in leads V3 and V4 that  raise my concern for ischemia and possible posterior MI.  This was discussed with Dr. Ellyn Hack who is on STEMI call.  He did not feel as though the patient met STEMI criteria, but should be treated as ACS.  Care also discussed with Dr. Koleen Nimrod who is on for cardiology.  He has recommended patient be admitted to the medicine service with cardiology consult.  I have spoken also with Dr. Sidney Ace who agrees to admit.  CRITICAL CARE Performed by: Veryl Speak Total critical care time: 40 minutes Critical care time was exclusive of separately billable procedures and treating other patients. Critical care was necessary to treat or prevent imminent  or life-threatening deterioration. Critical care was time spent personally by me on the following activities: development of treatment plan with patient and/or surrogate as well as nursing, discussions with consultants, evaluation of patient's response to treatment, examination of patient, obtaining history from patient or surrogate, ordering and performing treatments and interventions, ordering and review of laboratory studies, ordering and review of radiographic studies, pulse oximetry and re-evaluation of patient's condition.   Final Clinical Impression(s) / ED Diagnoses Final diagnoses:  None    Rx / DC Orders ED Discharge Orders     None         Veryl Speak, MD 05/31/22 [phone]

## 2022-05-31 NOTE — ED Notes (Signed)
Writer attempted to call report and was told someone would call back once the Charge RN was able to look through the chart.

## 2022-05-31 NOTE — Progress Notes (Signed)
[place] for heparin Indication: chest pain/ACS  No Known Allergies  Patient Measurements: Height: 6\' 2"  (188 cm) Weight: (!) 138.8 kg (306 lb 1.6 oz) IBW/kg (Calculated) : 82.2 Heparin Dosing Weight: 115kg  Vital Signs: Temp: 97.9 F (36.6 C) (03/30 0844) Temp Source: Oral (03/30 0844) BP: 149/111 (03/30 0844) Pulse Rate: 98 (03/30 0844)  Labs: Recent Labs    05/31/22 0355 05/31/22 0551 05/31/22 1105 05/31/22 1741  HGB 15.2  17.0  --   --   --   HCT 47.1  50.0  --   --   --   PLT 267  --   --   --   HEPARINUNFRC  --   --  0.39 0.31  CREATININE 1.37*  --  1.18  --   TROPONINIHS 316* 541* 3,215*  --      Estimated Creatinine Clearance: 106.1 mL/min (by C-G formula based on SCr of 1.18 mg/dL).   Medical History: Past Medical History:  Diagnosis Date   Chronic systolic (congestive) heart failure (HCC)    Coronary artery disease    Hypertension     Assessment: 52yo male c/o chest and back pain x2h followed by SOB, initial troponin elevated. Pharmacy consulted to begin heparin. Possible cath on Monday.   Confirm heparin level came back therapeutic but on the lower side. We will increase rate and check level in AM.   Goal of Therapy:  Heparin level 0.3-0.7 units/ml Monitor platelets by anticoagulation protocol: Yes   Plan:  Increase IV heparin 1650 units/hr Monitor heparin levels and CBC daily Monitor for s/sx of bleeding  F/u cards plans   Onnie Boer, PharmD, BCIDP, AAHIVP, CPP Infectious Disease Pharmacist 05/31/2022 6:59 PM

## 2022-05-31 NOTE — Progress Notes (Signed)
Plan of Care Note for accepted transfer   Patient: Clinton Johnson MRN: [id number]   [place]: 05/31/2022  Facility requesting transfer: [facility] Requesting Provider: Veryl Speak, MD   Reason for transfer: Non-STEMI, acute CHF Facility course:  Baljinder Krech is a 52 y.o. male, is a 52 year old male with past medical history of coronary artery disease with stents, CHF with EF of 35 to 40%, hypertension, hyperlipidemia.  Patient presenting today with complaints of shortness of breath.  This woke him up abruptly from sleep.  He denies any fevers or chills.  He describes some tightness in his chest and congestion.  He reports being compliant with his Lasix.  When he came to the ER BP was 145/109 with heart rate of 110 and respiratory rate of 31 Pulsoxymeter was 81-86% on room air and 94% on high flow nasal cannula.  Labs revealed VBG with pH 7.36 and HCO3 of 29.7, potassium 3.1, high sensitive troponin I of 316 and BNP of 223.1 with unremarkable CBC.  COVID-19 PCR came back negative  Portable chest x-ray showed diffuse hazy opacity in the lungs bilaterally with patchy airspace disease at the lung bases, possible edema versus pneumonitis.  EKG showed sinus tachycardia with a rate of 108 with left atrial enlargement and ST segment depression in V3 and inferiorly.  The patient was given 4 baby aspirin, 40 mg of IV Lasix, 4 mg of IV morphine sulfate, 1 inch of Nitropaste, IV heparin bolus and drip.  Contact was made with Dr. Ellyn Hack without and that this is not consistent with STEMI and later Dr. Koleen Nimrod.  Plan of care: The patient is accepted for admission to Progressive unit, at [facility]..   The patient will be under the care and responsibility of the ER physician until arrival to [facility].  Author: Christel Mormon, MD 05/31/2022  Check [email or web address] for on-call coverage.  Nursing staff, Please call [place] number on Amion as soon as patient's arrival, so  appropriate admitting provider can evaluate the pt.

## 2022-05-31 NOTE — Consult Note (Addendum)
Cardiology Consultation   Patient ID: Clinton Johnson MRN: [id number]; DOB: 1970-07-12  Admit date: 05/31/2022 Date of Consult: 05/31/2022  PCP:  Patient, No Pcp Per   [facility] [facility] Cardiologist:  Donato Heinz, MD        Patient Profile:   Clinton Johnson is a 52 y.o. male with a hx of CAD and prior stent to LAD, CHF with EF 35-40%, HLD, tobacco use and HTN who is being seen 05/31/2022 for the evaluation of chest pain and CHF at the request of Dr. Sidney Ace.  History of Present Illness:   Clinton Johnson has hx of stent to LAD in past (in [place] and then seen in 2022 here for chest pain and cardiac cath with patent stent to LAD, ramus lesion is 70% stenosed, pRCA 30% stenosed and pRCA to mRCA is 60% stenosed.  EF on Echo was 35-40%  with RWMA and mild concentric LVH. G2DD RV was normal ? LV thrombus but follow up echo with definity with no obvious thrombus at LV apex.    Pt did not show for follow up.    Pt presented to [place] today early AM hours with back and chest pain - woke with the pain then developed SOB.   His sp02 were in mid to upper 80s on RA.   He has rec'd ASA 324, lasix 40 IV, IV heparin IV morphine and NTG paste.  Dr. Ellyn Hack on call for STEMI was called concerning EKG and decided not STEMI but to treat as ACS.   CXR + CHF IMPRESSION: Diffuse hazy opacities in the lungs bilaterally with patchy airspace disease at the lung bases, possible edema versus pneumonitis.  EKG:  The EKG was personally reviewed and demonstrates:  ST 108 with ST depression in inf leads and V 3-4.   Inf leads with similar depression in 01/2021 but V3 and V4 are new.  Telemetry:  Telemetry was personally reviewed and demonstrates:  SR and runs of NSVT    Na 137 K+ once lab 3.1 and on 6.1 Cr 1.37  BNP 223 Hs troponin 316 and 541 WBC 7.6 Hgb 15.2 plts 267 COVID neg  BP 145/109 on arrival  now 129/101 P 98  R 19 afebrile.  Now at [facility] sitting on side of bed  with continued chest pain and back pain also states his lungs are filling up.   He did not take his lasix for 3 days and he continues to smoke.  He is only taking the lasix and a BP pill   Past Medical History:  Diagnosis Date   CHF (congestive heart failure) ([place])    Coronary artery disease    Hypertension     Past Surgical History:  Procedure Laterality Date   ABDOMINAL SURGERY     INTRAVASCULAR PRESSURE WIRE/FFR STUDY N/A 01/28/2021   Procedure: INTRAVASCULAR PRESSURE WIRE/FFR STUDY;  Surgeon: Lorretta Harp, MD;  Location: [facility];  Service: Cardiovascular;  Laterality: N/A;   LEFT HEART CATH AND CORONARY ANGIOGRAPHY N/A 01/28/2021   Procedure: LEFT HEART CATH AND CORONARY ANGIOGRAPHY;  Surgeon: Lorretta Harp, MD;  Location: [facility];  Service: Cardiovascular;  Laterality: N/A;     Home Medications:  Prior to Admission medications   Medication Sig Start Date End Date Taking? Authorizing Provider  aspirin 81 MG EC tablet Take 81 mg by mouth daily.    [provider]  atorvastatin (LIPITOR) 80 MG tablet Take 1  tablet (80 mg total) by mouth daily. 10/24/21   Deno Etienne, DO  furosemide (LASIX) 40 MG tablet Take 1 tablet (40 mg total) by mouth daily. 05/11/22   Sherrill Raring, PA-C  lidocaine (LIDODERM) 5 % Place 1 patch onto the skin daily. Remove & Discard patch within 12 hours or as directed by MD 02/03/22   Randal Buba, April, MD  losartan (COZAAR) 25 MG tablet Take 1 tablet (25 mg total) by mouth daily. 05/11/22   Sherrill Raring, PA-C  methocarbamol (ROBAXIN) 500 MG tablet Take 1 tablet (500 mg total) by mouth 2 (two) times daily. 01/30/22   Nuala Alpha A, PA-C  metoprolol tartrate (LOPRESSOR) 25 MG tablet Take 1 tablet (25 mg total) by mouth 2 (two) times daily. 01/29/21   Cheryln Manly, NP  nicotine (NICODERM CQ - DOSED IN MG/24 HOURS) 14 mg/24hr patch Place 1 patch (14 mg total) onto the skin daily. 01/30/21   Cheryln Manly, NP  nitroGLYCERIN  (NITROSTAT) 0.4 MG SL tablet Place 1 tablet (0.4 mg total) under the tongue every 5 (five) minutes x 3 doses as needed for chest pain. 01/29/21   Cheryln Manly, NP  diphenhydrAMINE (BENADRYL) 25 MG tablet Take 1 tablet (25 mg total) by mouth every 6 (six) hours. Patient not taking: Reported on 12/09/2015 06/06/15 03/03/20  Jola Schmidt, MD  fluticasone [facility]) 50 MCG/ACT nasal spray Place 2 sprays into both nostrils daily. Patient not taking: Reported on 12/09/2015 06/06/15 03/03/20  Jola Schmidt, MD    Inpatient Medications: Scheduled Meds:  furosemide  40 mg Intravenous STAT   metoprolol tartrate  25 mg Oral BID   potassium chloride  40 mEq Oral Once   Continuous Infusions:  heparin 1,600 Units/hr (05/31/22 0513)   nitroGLYCERIN     PRN Meds:   Allergies:   No Known Allergies  Social History:   Social History   Socioeconomic History   Marital status: Single    Spouse name: Not on file   Number of children: Not on file   Years of education: Not on file   Highest education level: Not on file  Occupational History   Not on file  Tobacco Use   Smoking status: Every Day    Packs/day: .5    Types: Cigarettes   Smokeless tobacco: Never  Vaping Use   Vaping Use: Never used  Substance and Sexual Activity   Alcohol use: Not Currently   Drug use: Never   Sexual activity: Not on file  Other Topics Concern   Not on file  Social History Narrative   Not on file   Social Determinants of Health   Financial Resource Strain: Not on file  Food Insecurity: No Food Insecurity (05/31/2022)   Hunger Vital Sign    Worried About Running Out of Food in the Last Year: Never true    Ran Out of Food in the Last Year: Never true  Transportation Needs: No Transportation Needs (05/31/2022)   PRAPARE - Hydrologist (Medical): No    Lack of Transportation (Non-Medical): No  Physical Activity: Not on file  Stress: Not on file  Social Connections: Not on file   Intimate Partner Violence: Not At Risk (05/31/2022)   Humiliation, Afraid, Rape, and Kick questionnaire    Fear of Current or Ex-Partner: No    Emotionally Abused: No    Physically Abused: No    Sexually Abused: No    Family History:    Family History  Problem Relation Age of Onset   Heart attack Mother      ROS:  Please see the history of present illness.  General:no colds or fevers, no weight changes Skin:no rashes or ulcers HEENT:no blurred vision, no congestion CV:see HPI PUL:see HPI GI:no diarrhea constipation or melena, no indigestion GU:no hematuria, no dysuria MS:no joint pain, no claudication Neuro:no syncope, no lightheadedness Endo:no diabetes, no thyroid disease  All other ROS reviewed and negative.     Physical Exam/Data:   Vitals:   05/31/22 0600 05/31/22 0752 05/31/22 0755 05/31/22 0844  BP: (!) 124/98 (!) 132/99  (!) 149/111  Pulse: 89 94  98  Resp: (!) 28   19  Temp:   98.3 F (36.8 C) 97.9 F (36.6 C)  TempSrc:   Oral Oral  SpO2: 97% 100%  91%  Weight:      Height:        Intake/Output Summary (Last 24 hours) at 05/31/2022 1036 Last data filed at 05/31/2022 0513 Gross per 24 hour  Intake --  Output 825 ml  Net -825 ml      05/31/2022    3:44 AM 05/11/2022    5:22 PM 02/02/2022   10:27 PM  Last 3 Weights  Weight (lbs) 306 lb 1.6 oz 270 lb 289 lb 14.5 oz  Weight (kg) 138.846 kg 122.471 kg 131.5 kg     Body mass index is 39.3 kg/m.  General:  Well nourished, well developed, in mild distress HEENT: normal Neck: no JVD sitting  Vascular: No carotid bruits; Distal pulses 2+ bilaterally Cardiac:  normal S1, S2; RRR; no murmur gallup rub or click Lungs:  rales to auscultation bilaterally, no wheezing, rhonchi  Abd: soft, nontender, no hepatomegaly  Ext: no edema Musculoskeletal:  No deformities, BUE and BLE strength normal and equal Skin: warm and dry  Neuro:  CNs 2-12 intact, no focal abnormalities noted Psych:  Normal affect     Relevant CV Studies: Cardiac cath 01/28/21    Ramus lesion is 70% stenosed.   Prox RCA lesion is 30% stenosed.   Prox RCA to Mid RCA lesion is 60% stenosed.   Previously placed Prox LAD to Mid LAD stent (unknown type) is  widely patent.  Mr. Chai has a widely patent proximal LAD stent. He did have an intermediate lesion in his proximal moderate-sized ramus Riyana Biel which was demonstrated to be not physiologically significant by DFR (0.95). He also has a intermediate mid RCA lesion that looks smooth and not significant.  Diagnostic Dominance: Right   ECHO 01/28/21 IMPRESSIONS     1. Akinsis of the apical septum, apical inferior, apical anterior, and  apex. Findings concerning for LAD infarction. There is suggestion of  possible LV thrombus in the apical 4 chamber views but this is not  definitive. Woud recommend a limited contrast  study for better characterization. Left ventricular ejection fraction, by  estimation, is 35 to 40%. The left ventricle has moderately decreased  function. The left ventricle demonstrates regional wall motion  abnormalities (see scoring diagram/findings for   description). There is mild concentric left ventricular hypertrophy. Left  ventricular diastolic parameters are consistent with Grade II diastolic  dysfunction (pseudonormalization).   2. Right ventricular systolic function is normal. The right ventricular  size is normal. Tricuspid regurgitation signal is inadequate for assessing  PA pressure.   3. The mitral valve is grossly normal. Trivial mitral valve  regurgitation. No evidence of mitral stenosis.   4. The aortic valve is tricuspid. Aortic  valve regurgitation is not  visualized. No aortic stenosis is present.   5. The inferior vena cava is dilated in size with >50% respiratory  variability, suggesting right atrial pressure of 8 mmHg.   FINDINGS   Left Ventricle: Akinsis of the apical septum, apical inferior, apical  anterior, and  apex. Findings concerning for LAD infarction. There is  suggestion of possible LV thrombus in the apical 4 chamber views but this  is not definitive. Woud recommend a  limited contrast study for better characterization. Left ventricular  ejection fraction, by estimation, is 35 to 40%. The left ventricle has  moderately decreased function. The left ventricle demonstrates regional  wall motion abnormalities. The left  ventricular internal cavity size was normal in size. There is mild  concentric left ventricular hypertrophy. Left ventricular diastolic  parameters are consistent with Grade II diastolic dysfunction  (pseudonormalization).     LV Wall Scoring:  The apical septal segment, apical anterior segment, apical inferior  segment,  and apex are akinetic.   Right Ventricle: The right ventricular size is normal. No increase in  right ventricular wall thickness. Right ventricular systolic function is  normal. Tricuspid regurgitation signal is inadequate for assessing PA  pressure.   Left Atrium: Left atrial size was normal in size.   Right Atrium: Right atrial size was normal in size.   Pericardium: Trivial pericardial effusion is present.   Mitral Valve: The mitral valve is grossly normal. Trivial mitral valve  regurgitation. No evidence of mitral valve stenosis.   Tricuspid Valve: The tricuspid valve is grossly normal. Tricuspid valve  regurgitation is trivial. No evidence of tricuspid stenosis.   Aortic Valve: The aortic valve is tricuspid. Aortic valve regurgitation is  not visualized. No aortic stenosis is present.   Pulmonic Valve: The pulmonic valve was grossly normal. Pulmonic valve  regurgitation is not visualized. No evidence of pulmonic stenosis.   Aorta: The aortic root is normal in size and structure.   Venous: The inferior vena cava is dilated in size with greater than 50%  respiratory variability, suggesting right atrial pressure of 8 mmHg.   IAS/Shunts:  The atrial septum is grossly normal.      Echo with Definity IMPRESSIONS     1. Limited echo with Definity. No obvious thrombus at LV apex.   FINDINGS   Left Ventricle: Limited echo with Definity. No obvious thrombus at LV  apex. Definity contrast agent was given IV to delineate the left  ventricular endocardial borders.   Laboratory Data:  High Sensitivity Troponin:   Recent Labs  Lab 05/31/22 0355 05/31/22 0551  TROPONINIHS 316* 541*     Chemistry Recent Labs  Lab 05/31/22 0355  NA 137  140  K 3.1*  6.1*  CL 103  CO2 25  GLUCOSE 127*  BUN 15  CREATININE 1.37*  CALCIUM 8.9  GFRNONAA >60  ANIONGAP 9    No results for input(s): "PROT", "ALBUMIN", "AST", "ALT", "ALKPHOS", "BILITOT" in the last 168 hours. Lipids No results for input(s): "CHOL", "TRIG", "HDL", "LABVLDL", "LDLCALC", "CHOLHDL" in the last 168 hours.  Hematology Recent Labs  Lab 05/31/22 0355  WBC 7.6  RBC 5.46  HGB 15.2  17.0  HCT 47.1  50.0  MCV 86.3  MCH 27.8  MCHC 32.3  RDW 16.5*  PLT 267   Thyroid No results for input(s): "TSH", "FREET4" in the last 168 hours.  BNP Recent Labs  Lab 05/31/22 0355  BNP 223.1*    DDimer No results for input(s): "  DDIMER" in the last 168 hours.   Radiology/Studies:  [facility] Chest Port 1 View  Result Date: 05/31/2022 CLINICAL DATA:  Shortness of breath. EXAM: PORTABLE CHEST 1 VIEW COMPARISON:  01/26/2021. FINDINGS: The heart size and mediastinal contours are within normal limits. Diffuse hazy airspace opacities are noted in the lungs bilaterally with patchy airspace disease at the lung bases. No effusion or pneumothorax. No acute osseous abnormality. IMPRESSION: Diffuse hazy opacities in the lungs bilaterally with patchy airspace disease at the lung bases, possible edema versus pneumonitis. Electronically Signed   By: Brett Fairy M.D.   On: 05/31/2022 04:19     Assessment and Plan:   NSTEMI with known CAD and hx of stent to LAD, last cath 2022 with  patent stent  and residual disease in Ramus and RCA.  Troponins elevated will check another, pt on IV heparin continue.  Check echo , concerning with continued chest pain.  Will begin NTG drip, BB and add morphine.  If we cannot control will need cardiac cath today.  Check EKG stat and repeat troponin along with BMP and Mg+ level. Dr. Harl Bowie to see pt CM in 2022 with EF 35-40% will recheck echo. Acute CHF systolic CHF - had not taken lasix for 3 days now with rales throughout will begin IV lasix pt was only on 2 meds at home - will check echo and add meds as needed.  NSVT labs are confusing though it appears K+ real number may be 3.1 not 6.1 will repeat stat.   HLD on statin recheck lipids Tobacco use instructed on stopping.   Risk Assessment/Risk Scores:     TIMI Risk Score for Unstable Angina or Non-ST Elevation MI:   The patient's TIMI risk score is 4, which indicates a 20% risk of all cause mortality, new or recurrent myocardial infarction or need for urgent revascularization in the next 14 days.  New York Heart Association (NYHA) Functional Class NYHA Class III        For questions or updates, please contact [place] Please consult [email or web address] for contact info under    Signed, Cecilie Kicks, NP  05/31/2022 10:36 AM  Attending note  Patient seen and discussed with PA Dorene Ar, I agree with her documentation. History of CAD with prior history of LAD stenting in [facility],  NSTEMI 01/2021 not requiring a stent , HTN, HL admitted with SOB and chest pain. REports SOB developed last night. Nonspecific chest fullness at times.   In ED hypoxia to upper 80s,     K 3.1 Cr 1.37 WBC 7.6 Hgb 15.2 plt 267 BNP 223 Trop 316-->541 EKG SR, inferior and anterolateral ST depressions  CXR: edema vs pneumonitis  01/2021 cath: patent LAD stent, ramus 70%, RCA prox 30% and mid 60% 01/2021 echo: LVE 35-40%, grade II dd    NSTEMI - history of CAD with prior stent to LAD -  01/2021 cath patent LAD stent, 70% ramus and 60% mid RCA managed medically - trop up to 541 without peak yet - EKG inferior and anterolateral ST depressions - echo pending  - medical therapy with ASA 81, atorva 80, lopressor 25mg  bid, hep gtt. Add ARB vs ARNI tomorrow pending renal function and hemodynamics.  With ongoing chest pain start NG drip, currently CP free - likely cath Monday     2.Acute on chronic HFrEF - 01/2021 echo: LVE 35-40%, grade II dd - CXR with pulmonary edema, BNP 223 - had missed home meds last few days  -  given IV lasix at 4AM, 835mL uop documented.  - additional 40mg  this afternoon - medical therapy with lopressor 25mg  bid for now. Consolidate to toprol pending hemodynamics. Hold on ACE/ARB/ARNI/MRA/SGLT2i today, begin to initiate tomorrow pending renal function and hemodynamics - repeat echo   3. NSVT - in setting of low LVEF, NSTEMI, hypokalemia - K 3.1, Mg pending - keep K 4, Mg 2 - start lopressor 25mg  bid   Carlyle Dolly MD

## 2022-05-31 NOTE — ED Notes (Signed)
[facility] called for transport. 

## 2022-06-01 DIAGNOSIS — I10 Essential (primary) hypertension: Secondary | ICD-10-CM

## 2022-06-01 DIAGNOSIS — Z955 Presence of coronary angioplasty implant and graft: Secondary | ICD-10-CM | POA: Insufficient documentation

## 2022-06-01 DIAGNOSIS — I5020 Unspecified systolic (congestive) heart failure: Secondary | ICD-10-CM | POA: Diagnosis not present

## 2022-06-01 DIAGNOSIS — I25110 Atherosclerotic heart disease of native coronary artery with unstable angina pectoris: Secondary | ICD-10-CM | POA: Insufficient documentation

## 2022-06-01 DIAGNOSIS — I214 Non-ST elevation (NSTEMI) myocardial infarction: Secondary | ICD-10-CM | POA: Diagnosis not present

## 2022-06-01 DIAGNOSIS — E661 Drug-induced obesity: Secondary | ICD-10-CM | POA: Diagnosis present

## 2022-06-01 DIAGNOSIS — Z8679 Personal history of other diseases of the circulatory system: Secondary | ICD-10-CM

## 2022-06-01 DIAGNOSIS — I2510 Atherosclerotic heart disease of native coronary artery without angina pectoris: Secondary | ICD-10-CM | POA: Insufficient documentation

## 2022-06-01 DIAGNOSIS — Z720 Tobacco use: Secondary | ICD-10-CM

## 2022-06-01 DIAGNOSIS — E782 Mixed hyperlipidemia: Secondary | ICD-10-CM

## 2022-06-01 DIAGNOSIS — I4729 Other ventricular tachycardia: Secondary | ICD-10-CM | POA: Insufficient documentation

## 2022-06-01 DIAGNOSIS — Z9189 Other specified personal risk factors, not elsewhere classified: Secondary | ICD-10-CM | POA: Insufficient documentation

## 2022-06-01 LAB — BASIC METABOLIC PANEL
Anion gap: 12 (ref 5–15)
BUN: 11 mg/dL (ref 6–20)
CO2: 22 mmol/L (ref 22–32)
Calcium: 9.3 mg/dL (ref 8.9–10.3)
Chloride: 102 mmol/L (ref 98–111)
Creatinine, Ser: 1.32 mg/dL — ABNORMAL HIGH (ref 0.61–1.24)
GFR, Estimated: >60 mL/min (ref >60)
Glucose, Bld: 126 mg/dL — ABNORMAL HIGH (ref 70–99)
Potassium: 3.8 mmol/L (ref 3.5–5.1)
Sodium: 136 mmol/L (ref 135–145)

## 2022-06-01 LAB — CBC
HCT: 45.3 % (ref 39.0–52.0)
Hemoglobin: 15.2 g/dL (ref 13.0–17.0)
MCH: 28 pg (ref 26.0–34.0)
MCHC: 33.6 g/dL (ref 30.0–36.0)
MCV: 83.4 fL (ref 80.0–100.0)
Platelets: 256 10*3/uL (ref 150–400)
RBC: 5.43 MIL/uL (ref 4.22–5.81)
RDW: 16.4 % — ABNORMAL HIGH (ref 11.5–15.5)
WBC: 10.2 10*3/uL (ref 4.0–10.5)
nRBC: 0 % (ref 0.0–0.2)

## 2022-06-01 LAB — TROPONIN I (HIGH SENSITIVITY): Troponin I (High Sensitivity): >24000 ng/L (ref <18)

## 2022-06-01 LAB — HEPARIN LEVEL (UNFRACTIONATED)
Heparin Unfractionated: 0.26 IU/mL — ABNORMAL LOW (ref 0.30–0.70)
Heparin Unfractionated: 0.32 IU/mL (ref 0.30–0.70)

## 2022-06-01 MED ORDER — SPIRONOLACTONE 12.5 MG HALF TABLET
12.5000 mg | ORAL_TABLET | Freq: Every day | ORAL | Status: DC
  Administered 2022-06-01 – 2022-06-04 (×4): 12.5 mg via ORAL
  Filled 2022-06-01 (×4): qty 1

## 2022-06-01 MED ORDER — ASPIRIN 81 MG PO CHEW
81.0000 mg | CHEWABLE_TABLET | ORAL | Status: AC
  Administered 2022-06-02: 81 mg via ORAL
  Filled 2022-06-01: qty 1

## 2022-06-01 MED ORDER — DAPAGLIFLOZIN PROPANEDIOL 10 MG PO TABS
10.0000 mg | ORAL_TABLET | Freq: Every day | ORAL | Status: DC
  Administered 2022-06-01 – 2022-06-04 (×4): 10 mg via ORAL
  Filled 2022-06-01 (×4): qty 1

## 2022-06-01 MED ORDER — OXYCODONE-ACETAMINOPHEN 5-325 MG PO TABS
1.0000 | ORAL_TABLET | ORAL | Status: DC | PRN
  Administered 2022-06-01: 1 via ORAL
  Filled 2022-06-01: qty 1

## 2022-06-01 MED ORDER — SODIUM CHLORIDE 0.9 % IV SOLN: 250.0000 mL | INTRAVENOUS | Status: DC | PRN

## 2022-06-01 MED ORDER — SODIUM CHLORIDE 0.9% FLUSH
3.0000 mL | Freq: Two times a day (BID) | INTRAVENOUS | Status: DC
  Administered 2022-06-03 – 2022-06-04 (×3): 3 mL via INTRAVENOUS

## 2022-06-01 MED ORDER — ASPIRIN 81 MG PO TBEC
81.0000 mg | DELAYED_RELEASE_TABLET | Freq: Every day | ORAL | Status: DC
  Administered 2022-06-03 – 2022-06-04 (×2): 81 mg via ORAL
  Filled 2022-06-01 (×2): qty 1

## 2022-06-01 MED ORDER — SODIUM CHLORIDE 0.9% FLUSH: 3.0000 mL | INTRAVENOUS | Status: DC | PRN

## 2022-06-01 MED ORDER — SODIUM CHLORIDE 0.9 % WEIGHT BASED INFUSION
1.0000 mL/kg/h | INTRAVENOUS | Status: DC
  Administered 2022-06-02: 1 mL/kg/h via INTRAVENOUS

## 2022-06-01 MED ORDER — LOSARTAN POTASSIUM 25 MG PO TABS
25.0000 mg | ORAL_TABLET | Freq: Every day | ORAL | Status: DC
  Administered 2022-06-01 – 2022-06-04 (×4): 25 mg via ORAL
  Filled 2022-06-01 (×4): qty 1

## 2022-06-01 MED ORDER — ORAL CARE MOUTH RINSE: 15.0000 mL | OROMUCOSAL | Status: DC | PRN

## 2022-06-01 NOTE — H&P (View-Only) (Signed)
Rounding Note    Patient Name: Clinton Johnson Date of Encounter: 06/01/2022  [place] Cardiologist: Donato Heinz, MD   Subjective   Mild chest tightness this AM. Some positional pain across upper shoulders.   Inpatient Medications    Scheduled Meds:  aspirin EC  81 mg Oral Daily   atorvastatin  80 mg Oral Daily   docusate sodium  100 mg Oral BID   furosemide  40 mg Intravenous BID   metoprolol tartrate  25 mg Oral BID   nicotine  14 mg Transdermal Daily   potassium chloride  20 mEq Oral QHS   sodium chloride flush  3 mL Intravenous Q12H   sodium chloride flush  3 mL Intravenous Q12H   Continuous Infusions:  heparin 1,900 Units/hr (06/01/22 0340)   nitroGLYCERIN 5 mcg/min (05/31/22 1057)   PRN Meds: acetaminophen **OR** acetaminophen, bisacodyl, hydrALAZINE, morphine injection, ondansetron **OR** ondansetron (ZOFRAN) IV, oxyCODONE, polyethylene glycol   Vital Signs    Vitals:   06/01/22 0005 06/01/22 0509 06/01/22 0750 06/01/22 0827  BP: (!) 131/95 (!) 121/91 (!) 135/98 (!) 137/105  Pulse: 96 96 (!) 108 (!) 107  Resp: 20 18 18    Temp: 99.6 F (37.6 C) 99.4 F (37.4 C) 99.5 F (37.5 C)   TempSrc: Oral Oral Oral   SpO2: 94% 95% 94%   Weight:  133.5 kg    Height:        Intake/Output Summary (Last 24 hours) at 06/01/2022 0854 Last data filed at 06/01/2022 0700 Gross per 24 hour  Intake 470.55 ml  Output 2550 ml  Net -2079.45 ml      06/01/2022    5:09 AM 05/31/2022    3:44 AM 05/11/2022    5:22 PM  Last 3 Weights  Weight (lbs) 294 lb 6.4 oz 306 lb 1.6 oz 270 lb  Weight (kg) 133.539 kg 138.846 kg 122.471 kg      Telemetry    NSR - Personally Reviewed  ECG    N/a - Personally Reviewed  Physical Exam   GEN: No acute distress.   Neck: No JVD Cardiac: RRR, no murmurs, rubs, or gallops.  Respiratory: Clear to auscultation bilaterally. GI: Soft, nontender, non-distended  MS: No edema; No deformity. Neuro:  Nonfocal  Psych:  Normal affect   Labs    High Sensitivity Troponin:   Recent Labs  Lab 05/31/22 0355 05/31/22 0551 05/31/22 1105 05/31/22 1741 06/01/22 0540  TROPONINIHS 316* 541* 3,215* 11,627* >24,000*     Chemistry Recent Labs  Lab 05/31/22 0355 05/31/22 1105 06/01/22 0218  NA 137  140 140 136  K 3.1*  6.1* 3.5 3.8  CL 103 102 102  CO2 25 24 22   GLUCOSE 127* 110* 126*  BUN 15 11 11   CREATININE 1.37* 1.18 1.32*  CALCIUM 8.9 9.2 9.3  MG  --  2.2  --   GFRNONAA >60 >60 >60  ANIONGAP 9 14 12     Lipids  Recent Labs  Lab 05/31/22 1105  CHOL 264*  TRIG 163*  HDL 43  LDLCALC 188*  CHOLHDL 6.1    Hematology Recent Labs  Lab 05/31/22 0355 06/01/22 0218  WBC 7.6 10.2  RBC 5.46 5.43  HGB 15.2  17.0 15.2  HCT 47.1  50.0 45.3  MCV 86.3 83.4  MCH 27.8 28.0  MCHC 32.3 33.6  RDW 16.5* 16.4*  PLT 267 256   Thyroid  Recent Labs  Lab 05/31/22 1105  TSH 4.506*    BNP Recent  Labs  Lab 05/31/22 0355  BNP 223.1*    DDimer No results for input(s): "DDIMER" in the last 168 hours.   Radiology    ECHOCARDIOGRAM COMPLETE  Result Date: 05/31/2022    ECHOCARDIOGRAM REPORT   Patient Name:   Clinton Johnson Date of Exam: 05/31/2022 Medical Rec #:  [id number]     Height:       74.0 in Accession #:    [id number]    Weight:       306.1 lb Date of Birth:  26-Mar-1970      BSA:          2.604 m Patient Age:    52 years      BP:           121/100 mmHg Patient Gender: M             HR:           93 bpm. Exam Location:  Inpatient Procedure: 2D Echo, Cardiac Doppler and Color Doppler Indications:    Chest Pain R07.09  History:        Patient has prior history of Echocardiogram examinations, most                 recent 01/28/2021. CHF, Previous Myocardial Infarction; Risk                 Factors:Hypertension, Dyslipidemia and Current Smoker.  Sonographer:    Ronny Flurry Referring Phys: [id number] JENNIFER YATES  Sonographer Comments: Image acquisition challenging due to uncooperative patient.  IMPRESSIONS  1. Left ventricular ejection fraction, by estimation, is 30 to 35%. The left ventricle has moderately decreased function. The left ventricle demonstrates regional wall motion abnormalities (see scoring diagram/findings for description). Left ventricular  diastolic parameters are indeterminate.  2. Right ventricular systolic function is normal. The right ventricular size is normal. Tricuspid regurgitation signal is inadequate for assessing PA pressure.  3. Left atrial size was mildly dilated.  4. The mitral valve is abnormal. Mild mitral valve regurgitation. No evidence of mitral stenosis.  5. The tricuspid valve is abnormal.  6. The aortic valve has an indeterminant number of cusps. Aortic valve regurgitation is not visualized. No aortic stenosis is present.  7. Aortic dilatation noted. There is mild dilatation of the ascending aorta, measuring 37 mm.  8. The inferior vena cava is normal in size with greater than 50% respiratory variability, suggesting right atrial pressure of 3 mmHg. FINDINGS  Left Ventricle: Mid to distal anteroseptal, anterolateral, anterior, inferoseptal walls are akinetic. Entire apex is akinetic. Left ventricular ejection fraction, by estimation, is 30 to 35%. The left ventricle has moderately decreased function. The left ventricle demonstrates regional wall motion abnormalities. The left ventricular internal cavity size was normal in size. There is no left ventricular hypertrophy. Left ventricular diastolic parameters are indeterminate. Right Ventricle: The right ventricular size is normal. Right vetricular wall thickness was not well visualized. Right ventricular systolic function is normal. Tricuspid regurgitation signal is inadequate for assessing PA pressure. Left Atrium: Left atrial size was mildly dilated. Right Atrium: Right atrial size was normal in size. Pericardium: There is no evidence of pericardial effusion. Mitral Valve: The mitral valve is abnormal. Mild mitral  valve regurgitation. No evidence of mitral valve stenosis. Tricuspid Valve: The tricuspid valve is abnormal. Tricuspid valve regurgitation is mild . No evidence of tricuspid stenosis. Aortic Valve: The aortic valve has an indeterminant number of cusps. Aortic valve regurgitation is not visualized. No aortic stenosis is present. Aortic  valve mean gradient measures 1.0 mmHg. Aortic valve peak gradient measures 2.5 mmHg. Aortic valve area, by VTI measures 3.18 cm. Pulmonic Valve: The pulmonic valve was not well visualized. Pulmonic valve regurgitation is mild. No evidence of pulmonic stenosis. Aorta: The aortic root is normal in size and structure and aortic dilatation noted. There is mild dilatation of the ascending aorta, measuring 37 mm. Venous: The inferior vena cava is normal in size with greater than 50% respiratory variability, suggesting right atrial pressure of 3 mmHg. IAS/Shunts: No atrial level shunt detected by color flow Doppler.  LEFT VENTRICLE PLAX 2D LVIDd:         5.60 cm   Diastology LVIDs:         4.50 cm   LV e' lateral:   8.10 cm/s LV PW:         1.00 cm   LV E/e' lateral: 8.6 LV IVS:        1.00 cm LVOT diam:     2.50 cm LV SV:         42 LV SV Index:   16 LVOT Area:     4.91 cm  RIGHT VENTRICLE             IVC RV S prime:     10.30 cm/s  IVC diam: 2.00 cm TAPSE (M-mode): 1.8 cm LEFT ATRIUM              Index        RIGHT ATRIUM           Index LA diam:        4.70 cm  1.80 cm/m   RA Area:     19.20 cm LA Vol (A2C):   87.8 ml  33.71 ml/m  RA Volume:   55.70 ml  21.39 ml/m LA Vol (A4C):   108.0 ml 41.47 ml/m LA Biplane Vol: 97.7 ml  37.51 ml/m  AORTIC VALVE AV Area (Vmax):    3.21 cm AV Area (Vmean):   2.82 cm AV Area (VTI):     3.18 cm AV Vmax:           79.50 cm/s AV Vmean:          56.000 cm/s AV VTI:            0.133 m AV Peak Grad:      2.5 mmHg AV Mean Grad:      1.0 mmHg LVOT Vmax:         51.96 cm/s LVOT Vmean:        32.120 cm/s LVOT VTI:          0.086 m LVOT/AV VTI ratio: 0.65   AORTA Ao Root diam: 3.50 cm Ao Asc diam:  3.70 cm MITRAL VALVE MV Area (PHT): 5.97 cm    SHUNTS MV Decel Time: 127 msec    Systemic VTI:  0.09 m MV E velocity: 70.00 cm/s  Systemic Diam: 2.50 cm MV A velocity: 44.30 cm/s MV E/A ratio:  1.58 Carlyle Dolly MD Electronically signed by Carlyle Dolly MD Signature Date/Time: 05/31/2022/3:38:47 PM    Final    DG Chest Port 1 View  Result Date: 05/31/2022 CLINICAL DATA:  Shortness of breath. EXAM: PORTABLE CHEST 1 VIEW COMPARISON:  01/26/2021. FINDINGS: The heart size and mediastinal contours are within normal limits. Diffuse hazy airspace opacities are noted in the lungs bilaterally with patchy airspace disease at the lung bases. No effusion or pneumothorax. No acute osseous abnormality. IMPRESSION: Diffuse hazy opacities  in the lungs bilaterally with patchy airspace disease at the lung bases, possible edema versus pneumonitis. Electronically Signed   By: Brett Fairy M.D.   On: 05/31/2022 04:19    Cardiac Studies     Patient Profile     Clinton Johnson is a 52 y.o. male with a hx of CAD and prior stent to LAD, CHF with EF 35-40%, HLD, tobacco use and HTN who is being seen 05/31/2022 for the evaluation of chest pain and CHF at the request of Dr. Sidney Ace.   Assessment & Plan    1.CAD/NSTEMI - - history of CAD with prior stent to LAD - 01/2021 cath patent LAD stent, 70% ramus and 60% mid RCA managed medically  - trop up initially 316, trended upt >24,000. EKG inferior and anterolateral ST depressions, repeat this AM did not cross into epic but in paper chart show stable EKG findings.  - echo LVEF 30-35%, mid to distal anteroseptal, anterolateral, anterior,  inferoseptal walls are akinetic. Entire apex is akinetic. Prior EF 35-40%  - medical therapy with ASA 81, atorva 80, hep gtt, lopressor 47m bid, nitro gtt - no chest pains. Upper back pain worst with position and palpation. Mild chest tightness this AM, will titrate nitro gtt.   -plan for cath  tomorrow  Shared Decision Making/Informed Consent The risks [stroke (1 in 1000), death (1 in 1000), kidney failure [usually temporary] (1 in 500), bleeding (1 in 200), allergic reaction [possibly serious] (1 in 200)], benefits (diagnostic support and management of coronary artery disease) and alternatives of a cardiac catheterization were discussed in detail with Clinton Johnson and he is willing to proceed.   2. Acute on chronic HFrEF - 01/2021 echo LVEF 35-40% - 05/2022 echo LVEF 30-35% - - CXR with pulmonary edema, BNP 223 - had missed home meds last few days  - he is on IV lasix 40mg  bid. Neg 2 L yesterday, neg 2.9 L since admission. Mild variations in Cr without clear trend. - medical therapy with lopressor 25mg  bid, losartan 25mg , aldactone 12.5mg  daily. Possibly transition to ARNI pending renal function with diuresis. Add farxiga 10mg  daily. Consolidate lopressor to toprol likely tomorrow  3. NSVT - short runs in setting of low EF, ACS, hypokalemia - tele shows runs have resolved. Continue to monitor.   For questions or updates, please contact [place] Please consult [email or web address] for contact info under        Signed, Carlyle Dolly, MD  06/01/2022, 8:54 AM

## 2022-06-01 NOTE — Progress Notes (Addendum)
Rounding Note    Patient Name: Clinton Johnson Date of Encounter: 06/01/2022  [place] Cardiologist: Donato Heinz, MD   Subjective   Mild chest tightness this AM. Some positional pain across upper shoulders.   Inpatient Medications    Scheduled Meds:  aspirin EC  81 mg Oral Daily   atorvastatin  80 mg Oral Daily   docusate sodium  100 mg Oral BID   furosemide  40 mg Intravenous BID   metoprolol tartrate  25 mg Oral BID   nicotine  14 mg Transdermal Daily   potassium chloride  20 mEq Oral QHS   sodium chloride flush  3 mL Intravenous Q12H   sodium chloride flush  3 mL Intravenous Q12H   Continuous Infusions:  heparin 1,900 Units/hr (06/01/22 0340)   nitroGLYCERIN 5 mcg/min (05/31/22 1057)   PRN Meds: acetaminophen **OR** acetaminophen, bisacodyl, hydrALAZINE, morphine injection, ondansetron **OR** ondansetron (ZOFRAN) IV, oxyCODONE, polyethylene glycol   Vital Signs    Vitals:   06/01/22 0005 06/01/22 0509 06/01/22 0750 06/01/22 0827  BP: (!) 131/95 (!) 121/91 (!) 135/98 (!) 137/105  Pulse: 96 96 (!) 108 (!) 107  Resp: 20 18 18    Temp: 99.6 F (37.6 C) 99.4 F (37.4 C) 99.5 F (37.5 C)   TempSrc: Oral Oral Oral   SpO2: 94% 95% 94%   Weight:  133.5 kg    Height:        Intake/Output Summary (Last 24 hours) at 06/01/2022 0854 Last data filed at 06/01/2022 0700 Gross per 24 hour  Intake 470.55 ml  Output 2550 ml  Net -2079.45 ml      06/01/2022    5:09 AM 05/31/2022    3:44 AM 05/11/2022    5:22 PM  Last 3 Weights  Weight (lbs) 294 lb 6.4 oz 306 lb 1.6 oz 270 lb  Weight (kg) 133.539 kg 138.846 kg 122.471 kg      Telemetry    NSR - Personally Reviewed  ECG    N/a - Personally Reviewed  Physical Exam   GEN: No acute distress.   Neck: No JVD Cardiac: RRR, no murmurs, rubs, or gallops.  Respiratory: Clear to auscultation bilaterally. GI: Soft, nontender, non-distended  MS: No edema; No deformity. Neuro:  Nonfocal  Psych:  Normal affect   Labs    High Sensitivity Troponin:   Recent Labs  Lab 05/31/22 0355 05/31/22 0551 05/31/22 1105 05/31/22 1741 06/01/22 0540  TROPONINIHS 316* 541* 3,215* 11,627* >24,000*     Chemistry Recent Labs  Lab 05/31/22 0355 05/31/22 1105 06/01/22 0218  NA 137  140 140 136  K 3.1*  6.1* 3.5 3.8  CL 103 102 102  CO2 25 24 22   GLUCOSE 127* 110* 126*  BUN 15 11 11   CREATININE 1.37* 1.18 1.32*  CALCIUM 8.9 9.2 9.3  MG  --  2.2  --   GFRNONAA >60 >60 >60  ANIONGAP 9 14 12     Lipids  Recent Labs  Lab 05/31/22 1105  CHOL 264*  TRIG 163*  HDL 43  LDLCALC 188*  CHOLHDL 6.1    Hematology Recent Labs  Lab 05/31/22 0355 06/01/22 0218  WBC 7.6 10.2  RBC 5.46 5.43  HGB 15.2  17.0 15.2  HCT 47.1  50.0 45.3  MCV 86.3 83.4  MCH 27.8 28.0  MCHC 32.3 33.6  RDW 16.5* 16.4*  PLT 267 256   Thyroid  Recent Labs  Lab 05/31/22 1105  TSH 4.506*    BNP Recent  Labs  Lab 05/31/22 0355  BNP 223.1*    DDimer No results for input(s): "DDIMER" in the last 168 hours.   Radiology    ECHOCARDIOGRAM COMPLETE  Result Date: 05/31/2022    ECHOCARDIOGRAM REPORT   Patient Name:   Clinton Johnson Date of Exam: 05/31/2022 Medical Rec #:  [id number]     Height:       74.0 in Accession #:    [id number]    Weight:       306.1 lb Date of Birth:  12/24/1970      BSA:          2.604 m Patient Age:    52 years      BP:           121/100 mmHg Patient Gender: M             HR:           93 bpm. Exam Location:  Inpatient Procedure: 2D Echo, Cardiac Doppler and Color Doppler Indications:    Chest Pain R07.09  History:        Patient has prior history of Echocardiogram examinations, most                 recent 01/28/2021. CHF, Previous Myocardial Infarction; Risk                 Factors:Hypertension, Dyslipidemia and Current Smoker.  Sonographer:    Ronny Flurry Referring Phys: [id number] JENNIFER YATES  Sonographer Comments: Image acquisition challenging due to uncooperative patient.  IMPRESSIONS  1. Left ventricular ejection fraction, by estimation, is 30 to 35%. The left ventricle has moderately decreased function. The left ventricle demonstrates regional wall motion abnormalities (see scoring diagram/findings for description). Left ventricular  diastolic parameters are indeterminate.  2. Right ventricular systolic function is normal. The right ventricular size is normal. Tricuspid regurgitation signal is inadequate for assessing PA pressure.  3. Left atrial size was mildly dilated.  4. The mitral valve is abnormal. Mild mitral valve regurgitation. No evidence of mitral stenosis.  5. The tricuspid valve is abnormal.  6. The aortic valve has an indeterminant number of cusps. Aortic valve regurgitation is not visualized. No aortic stenosis is present.  7. Aortic dilatation noted. There is mild dilatation of the ascending aorta, measuring 37 mm.  8. The inferior vena cava is normal in size with greater than 50% respiratory variability, suggesting right atrial pressure of 3 mmHg. FINDINGS  Left Ventricle: Mid to distal anteroseptal, anterolateral, anterior, inferoseptal walls are akinetic. Entire apex is akinetic. Left ventricular ejection fraction, by estimation, is 30 to 35%. The left ventricle has moderately decreased function. The left ventricle demonstrates regional wall motion abnormalities. The left ventricular internal cavity size was normal in size. There is no left ventricular hypertrophy. Left ventricular diastolic parameters are indeterminate. Right Ventricle: The right ventricular size is normal. Right vetricular wall thickness was not well visualized. Right ventricular systolic function is normal. Tricuspid regurgitation signal is inadequate for assessing PA pressure. Left Atrium: Left atrial size was mildly dilated. Right Atrium: Right atrial size was normal in size. Pericardium: There is no evidence of pericardial effusion. Mitral Valve: The mitral valve is abnormal. Mild mitral  valve regurgitation. No evidence of mitral valve stenosis. Tricuspid Valve: The tricuspid valve is abnormal. Tricuspid valve regurgitation is mild . No evidence of tricuspid stenosis. Aortic Valve: The aortic valve has an indeterminant number of cusps. Aortic valve regurgitation is not visualized. No aortic stenosis is present. Aortic  valve mean gradient measures 1.0 mmHg. Aortic valve peak gradient measures 2.5 mmHg. Aortic valve area, by VTI measures 3.18 cm. Pulmonic Valve: The pulmonic valve was not well visualized. Pulmonic valve regurgitation is mild. No evidence of pulmonic stenosis. Aorta: The aortic root is normal in size and structure and aortic dilatation noted. There is mild dilatation of the ascending aorta, measuring 37 mm. Venous: The inferior vena cava is normal in size with greater than 50% respiratory variability, suggesting right atrial pressure of 3 mmHg. IAS/Shunts: No atrial level shunt detected by color flow Doppler.  LEFT VENTRICLE PLAX 2D LVIDd:         5.60 cm   Diastology LVIDs:         4.50 cm   LV e' lateral:   8.10 cm/s LV PW:         1.00 cm   LV E/e' lateral: 8.6 LV IVS:        1.00 cm LVOT diam:     2.50 cm LV SV:         42 LV SV Index:   16 LVOT Area:     4.91 cm  RIGHT VENTRICLE             IVC RV S prime:     10.30 cm/s  IVC diam: 2.00 cm TAPSE (M-mode): 1.8 cm LEFT ATRIUM              Index        RIGHT ATRIUM           Index LA diam:        4.70 cm  1.80 cm/m   RA Area:     19.20 cm LA Vol (A2C):   87.8 ml  33.71 ml/m  RA Volume:   55.70 ml  21.39 ml/m LA Vol (A4C):   108.0 ml 41.47 ml/m LA Biplane Vol: 97.7 ml  37.51 ml/m  AORTIC VALVE AV Area (Vmax):    3.21 cm AV Area (Vmean):   2.82 cm AV Area (VTI):     3.18 cm AV Vmax:           79.50 cm/s AV Vmean:          56.000 cm/s AV VTI:            0.133 m AV Peak Grad:      2.5 mmHg AV Mean Grad:      1.0 mmHg LVOT Vmax:         51.96 cm/s LVOT Vmean:        32.120 cm/s LVOT VTI:          0.086 m LVOT/AV VTI ratio: 0.65   AORTA Ao Root diam: 3.50 cm Ao Asc diam:  3.70 cm MITRAL VALVE MV Area (PHT): 5.97 cm    SHUNTS MV Decel Time: 127 msec    Systemic VTI:  0.09 m MV E velocity: 70.00 cm/s  Systemic Diam: 2.50 cm MV A velocity: 44.30 cm/s MV E/A ratio:  1.58 Carlyle Dolly MD Electronically signed by Carlyle Dolly MD Signature Date/Time: 05/31/2022/3:38:47 PM    Final    DG Chest Port 1 View  Result Date: 05/31/2022 CLINICAL DATA:  Shortness of breath. EXAM: PORTABLE CHEST 1 VIEW COMPARISON:  01/26/2021. FINDINGS: The heart size and mediastinal contours are within normal limits. Diffuse hazy airspace opacities are noted in the lungs bilaterally with patchy airspace disease at the lung bases. No effusion or pneumothorax. No acute osseous abnormality. IMPRESSION: Diffuse hazy opacities  in the lungs bilaterally with patchy airspace disease at the lung bases, possible edema versus pneumonitis. Electronically Signed   By: Brett Fairy M.D.   On: 05/31/2022 04:19    Cardiac Studies     Patient Profile     Clinton Johnson is a 52 y.o. male with a hx of CAD and prior stent to LAD, CHF with EF 35-40%, HLD, tobacco use and HTN who is being seen 05/31/2022 for the evaluation of chest pain and CHF at the request of Dr. Sidney Ace.   Assessment & Plan    1.CAD/NSTEMI - - history of CAD with prior stent to LAD - 01/2021 cath patent LAD stent, 70% ramus and 60% mid RCA managed medically  - trop up initially 316, trended upt >24,000. EKG inferior and anterolateral ST depressions, repeat this AM did not cross into epic but in paper chart show stable EKG findings.  - echo LVEF 30-35%, mid to distal anteroseptal, anterolateral, anterior,  inferoseptal walls are akinetic. Entire apex is akinetic. Prior EF 35-40%  - medical therapy with ASA 81, atorva 80, hep gtt, lopressor 34m bid, nitro gtt - no chest pains. Upper back pain worst with position and palpation. Mild chest tightness this AM, will titrate nitro gtt.   -plan for cath  tomorrow  Shared Decision Making/Informed Consent The risks [stroke (1 in 1000), death (1 in 1000), kidney failure [usually temporary] (1 in 500), bleeding (1 in 200), allergic reaction [possibly serious] (1 in 200)], benefits (diagnostic support and management of coronary artery disease) and alternatives of a cardiac catheterization were discussed in detail with Mr. Travassos and he is willing to proceed.   2. Acute on chronic HFrEF - 01/2021 echo LVEF 35-40% - 05/2022 echo LVEF 30-35% - - CXR with pulmonary edema, BNP 223 - had missed home meds last few days  - he is on IV lasix 40mg  bid. Neg 2 L yesterday, neg 2.9 L since admission. Mild variations in Cr without clear trend. - medical therapy with lopressor 25mg  bid, losartan 25mg , aldactone 12.5mg  daily. Possibly transition to ARNI pending renal function with diuresis. Add farxiga 10mg  daily. Consolidate lopressor to toprol likely tomorrow  3. NSVT - short runs in setting of low EF, ACS, hypokalemia - tele shows runs have resolved. Continue to monitor.   For questions or updates, please contact [place] Please consult [email or web address] for contact info under        Signed, Carlyle Dolly, MD  06/01/2022, 8:54 AM

## 2022-06-01 NOTE — Progress Notes (Signed)
PROGRESS NOTE  Clinton Johnson [id number] DOB: Sep 01, 1970   PCP: Patient, No Pcp Per  Patient is from: Home  DOA: 05/31/2022 LOS: 1  Chief complaints Chief Complaint  Patient presents with   Shortness of Breath     Brief Narrative / Interim history: 52 year old M with PMH of CAD s/p DES to LAD in 2020, combined CHF, HTN, tobacco use disorder, morbid obesity and noncompliance with meds presenting with substernal chest pain and SOB that woke him up from sleep.  Chest pain improved with nitroglycerin patch.  In ED, slightly tachycardic and desaturated to 80s on RA requiring supplemental oxygen.  Troponin 316.  BNP 223.  CXR with diffuse hazy opacity bilaterally with patchy airspace disease at lung bases.  EKG with ST to 108, LAE and STD in V3 and inferior leads.  Patient was given full dose aspirin, IV Lasix, Nitropaste and started on IV heparin.  Cardiology consulted.  Admitted for non-STEMI and CHF exacerbation.  TTE with LVEF of 30 to 35% (previously 35-40), some RWMA.  Plan for left heart catheterization.    Subjective: Seen and examined earlier this morning.  Continues to endorse some shortness of breath and chest pain.  Also reports left back pain.  He thinks he has fluid in his lungs.  Somewhat frustrated.   Objective: Vitals:   06/01/22 0005 06/01/22 0509 06/01/22 0750 06/01/22 0827  BP: (!) 131/95 (!) 121/91 (!) 135/98 (!) 137/105  Pulse: 96 96 (!) 108 (!) 107  Resp: 20 18 18    Temp: 99.6 F (37.6 C) 99.4 F (37.4 C) 99.5 F (37.5 C)   TempSrc: Oral Oral Oral   SpO2: 94% 95% 94%   Weight:  133.5 kg    Height:        Examination:  GENERAL: No apparent distress.  Nontoxic. HEENT: MMM.  Vision and hearing grossly intact.  NECK: Supple.  No apparent JVD.  RESP:  No IWOB.  Fair aeration bilaterally. CVS:  RRR. Heart sounds normal.  ABD/GI/GU: BS+. Abd soft, NTND.  MSK/EXT:  Moves extremities. No apparent deformity. No edema.  SKIN: no apparent skin lesion or  wound NEURO: Awake, alert and oriented appropriately.  No apparent focal neuro deficit. PSYCH: Calm. Normal affect.   Procedures:  None  Microbiology summarized: COVID-19 PCR nonreactive  Assessment and plan: Principal Problem:   NSTEMI (non-ST elevated myocardial infarction) ([place]) Active Problems:   Tobacco use   Primary hypertension   HFrEF (heart failure with reduced ejection fraction) ([place])   Hyperlipidemia  NSTEMI: Presents with dyspnea, substernal chest pain.  Troponin trended from 300 to > 24,000.  Has history of CAD and DES to proximal LAD in 2020.  LHC in 2022 with patent LAD stent and 70% ramus and 60% mid RCA stenosis that has been managed medically.  Doubt compliance with meds.  UDS positive for opiate.  LDL 188 -Cardiology on board-on aspirin, statin, heparin drip, Lopressor and nitro drip -Plan for left heart catheterization on 4/1. -Follow hemoglobin A1c  Acute on chronic combined CHF: In the setting of non-STEMI and noncompliance with meds.  BNP 223.  CXR concerning for pulmonary edema.  Grossly euvolemic on exam.  TTE as above.  Started on IV Lasix.  Net -2.9 L.  Creatinine stable. -Continue IV Lasix -GDMT losartan, Aldactone, Lopressor -Strict intake and output, daily weight, renal functions and electrolytes  History of CAD s/p DES to proximal LAD in 2020.  LHC in 2022 with patent LAD stent, 70% ramus and 60% mid RCA  stenosis -Management as above  NSVT: -Lopressor as above -Optimize electrolytes  Essential hypertension: Normotensive -Cardiac meds as above  Tobacco use disorder -Encourage smoking cessation -Nicotine patch  Left upper back pain: Looks musculoskeletal. -Analgesics as needed  Morbid obesity Body mass index is 37.8 kg/m.          DVT prophylaxis:  On full dose anticoagulation  Code Status: Full code Family Communication: None at bedside Level of care: Progressive Status is: Inpatient Remains inpatient appropriate because:  Non-STEMI and acute CHF   Final disposition: Home once medically cleared Consultants:  Cardiology  55 minutes with more than 50% spent in reviewing records, counseling patient/family and coordinating care.   Sch Meds:  Scheduled Meds:  aspirin EC  81 mg Oral Daily   atorvastatin  80 mg Oral Daily   dapagliflozin propanediol  10 mg Oral Daily   docusate sodium  100 mg Oral BID   furosemide  40 mg Intravenous BID   losartan  25 mg Oral Daily   metoprolol tartrate  25 mg Oral BID   nicotine  14 mg Transdermal Daily   potassium chloride  20 mEq Oral QHS   sodium chloride flush  3 mL Intravenous Q12H   sodium chloride flush  3 mL Intravenous Q12H   spironolactone  12.5 mg Oral Daily   Continuous Infusions:  heparin 1,900 Units/hr (06/01/22 0340)   nitroGLYCERIN 5 mcg/min (05/31/22 1057)   PRN Meds:.acetaminophen **OR** acetaminophen, bisacodyl, hydrALAZINE, morphine injection, ondansetron **OR** ondansetron (ZOFRAN) IV, oxyCODONE-acetaminophen, polyethylene glycol  Antimicrobials: Anti-infectives (From admission, onward)    None        I have personally reviewed the following labs and images: CBC: Recent Labs  Lab 05/31/22 0355 06/01/22 0218  WBC 7.6 10.2  HGB 15.2  17.0 15.2  HCT 47.1  50.0 45.3  MCV 86.3 83.4  PLT 267 256   BMP &GFR Recent Labs  Lab 05/31/22 0355 05/31/22 1105 06/01/22 0218  NA 137  140 140 136  K 3.1*  6.1* 3.5 3.8  CL 103 102 102  CO2 25 24 22   GLUCOSE 127* 110* 126*  BUN 15 11 11   CREATININE 1.37* 1.18 1.32*  CALCIUM 8.9 9.2 9.3  MG  --  2.2  --    Estimated Creatinine Clearance: 92.9 mL/min (A) (by C-G formula based on SCr of 1.32 mg/dL (H)). Liver & Pancreas: No results for input(s): "AST", "ALT", "ALKPHOS", "BILITOT", "PROT", "ALBUMIN" in the last 168 hours. No results for input(s): "LIPASE", "AMYLASE" in the last 168 hours. No results for input(s): "AMMONIA" in the last 168 hours. Diabetic: No results for input(s):  "HGBA1C" in the last 72 hours. No results for input(s): "GLUCAP" in the last 168 hours. Cardiac Enzymes: No results for input(s): "CKTOTAL", "CKMB", "CKMBINDEX", "TROPONINI" in the last 168 hours. No results for input(s): "PROBNP" in the last 8760 hours. Coagulation Profile: No results for input(s): "INR", "PROTIME" in the last 168 hours. Thyroid Function Tests: Recent Labs    05/31/22 1105  TSH 4.506*   Lipid Profile: Recent Labs    05/31/22 1105  CHOL 264*  HDL 43  LDLCALC 188*  TRIG 163*  CHOLHDL 6.1   Anemia Panel: No results for input(s): "VITAMINB12", "FOLATE", "FERRITIN", "TIBC", "IRON", "RETICCTPCT" in the last 72 hours. Urine analysis: No results found for: "COLORURINE", "APPEARANCEUR", "LABSPEC", "PHURINE", "GLUCOSEU", "HGBUR", "BILIRUBINUR", "KETONESUR", "PROTEINUR", "UROBILINOGEN", "NITRITE", "LEUKOCYTESUR" Sepsis Labs: Invalid input(s): "PROCALCITONIN", "LACTICIDVEN"  Microbiology: Recent Results (from the past 240 hour(s))  SARS Coronavirus 2  by RT PCR (hospital order, performed in [facility] hospital lab) *cepheid single result test* Anterior Nasal Swab     Status: None   Collection Time: 05/31/22  3:58 AM   Specimen: Anterior Nasal Swab  Result Value Ref Range Status   SARS Coronavirus 2 by RT PCR NEGATIVE NEGATIVE Final    Comment: (NOTE) SARS-CoV-2 target nucleic acids are NOT DETECTED.  The SARS-CoV-2 RNA is generally detectable in upper and lower respiratory specimens during the acute phase of infection. The lowest concentration of SARS-CoV-2 viral copies this assay can detect is 250 copies / mL. A negative result does not preclude SARS-CoV-2 infection and should not be used as the sole basis for treatment or other patient management decisions.  A negative result may occur with improper specimen collection / handling, submission of specimen other than nasopharyngeal swab, presence of viral mutation(s) within the areas targeted by this assay, and  inadequate number of viral copies (<250 copies / mL). A negative result must be combined with clinical observations, patient history, and epidemiological information.  Fact Sheet for Patients:   https://www.patel.info/  Fact Sheet for Healthcare Providers: https://hall.com/  This test is not yet approved or  cleared by the [place] FDA and has been authorized for detection and/or diagnosis of SARS-CoV-2 by FDA under an Emergency Use Authorization (EUA).  This EUA will remain in effect (meaning this test can be used) for the duration of the COVID-19 declaration under Section 564(b)(1) of the Act, 21 U.S.C. section 360bbb-3(b)(1), unless the authorization is terminated or revoked sooner.  Performed at [facility], [address] [place], [place] [address]     Radiology Studies: ECHOCARDIOGRAM COMPLETE  Result Date: 05/31/2022    ECHOCARDIOGRAM REPORT   Patient Name:   KHORI STCHARLES Date of Exam: 05/31/2022 Medical Rec #:  [id number]     Height:       74.0 in Accession #:    [id number]    Weight:       306.1 lb Date of Birth:  04/21/1970      BSA:          2.604 m Patient Age:    10 years      BP:           121/100 mmHg Patient Gender: M             HR:           93 bpm. Exam Location:  Inpatient Procedure: 2D Echo, Cardiac Doppler and Color Doppler Indications:    Chest Pain R07.09  History:        Patient has prior history of Echocardiogram examinations, most                 recent 01/28/2021. CHF, Previous Myocardial Infarction; Risk                 Factors:Hypertension, Dyslipidemia and Current Smoker.  Sonographer:    Ronny Flurry Referring Phys: [id number] JENNIFER YATES  Sonographer Comments: Image acquisition challenging due to uncooperative patient. IMPRESSIONS  1. Left ventricular ejection fraction, by estimation, is 30 to 35%. The left ventricle has moderately decreased function. The left ventricle demonstrates regional wall  motion abnormalities (see scoring diagram/findings for description). Left ventricular  diastolic parameters are indeterminate.  2. Right ventricular systolic function is normal. The right ventricular size is normal. Tricuspid regurgitation signal is inadequate for assessing PA pressure.  3. Left atrial size was mildly dilated.  4. The mitral  valve is abnormal. Mild mitral valve regurgitation. No evidence of mitral stenosis.  5. The tricuspid valve is abnormal.  6. The aortic valve has an indeterminant number of cusps. Aortic valve regurgitation is not visualized. No aortic stenosis is present.  7. Aortic dilatation noted. There is mild dilatation of the ascending aorta, measuring 37 mm.  8. The inferior vena cava is normal in size with greater than 50% respiratory variability, suggesting right atrial pressure of 3 mmHg. FINDINGS  Left Ventricle: Mid to distal anteroseptal, anterolateral, anterior, inferoseptal walls are akinetic. Entire apex is akinetic. Left ventricular ejection fraction, by estimation, is 30 to 35%. The left ventricle has moderately decreased function. The left ventricle demonstrates regional wall motion abnormalities. The left ventricular internal cavity size was normal in size. There is no left ventricular hypertrophy. Left ventricular diastolic parameters are indeterminate. Right Ventricle: The right ventricular size is normal. Right vetricular wall thickness was not well visualized. Right ventricular systolic function is normal. Tricuspid regurgitation signal is inadequate for assessing PA pressure. Left Atrium: Left atrial size was mildly dilated. Right Atrium: Right atrial size was normal in size. Pericardium: There is no evidence of pericardial effusion. Mitral Valve: The mitral valve is abnormal. Mild mitral valve regurgitation. No evidence of mitral valve stenosis. Tricuspid Valve: The tricuspid valve is abnormal. Tricuspid valve regurgitation is mild . No evidence of tricuspid stenosis.  Aortic Valve: The aortic valve has an indeterminant number of cusps. Aortic valve regurgitation is not visualized. No aortic stenosis is present. Aortic valve mean gradient measures 1.0 mmHg. Aortic valve peak gradient measures 2.5 mmHg. Aortic valve area, by VTI measures 3.18 cm. Pulmonic Valve: The pulmonic valve was not well visualized. Pulmonic valve regurgitation is mild. No evidence of pulmonic stenosis. Aorta: The aortic root is normal in size and structure and aortic dilatation noted. There is mild dilatation of the ascending aorta, measuring 37 mm. Venous: The inferior vena cava is normal in size with greater than 50% respiratory variability, suggesting right atrial pressure of 3 mmHg. IAS/Shunts: No atrial level shunt detected by color flow Doppler.  LEFT VENTRICLE PLAX 2D LVIDd:         5.60 cm   Diastology LVIDs:         4.50 cm   LV e' lateral:   8.10 cm/s LV PW:         1.00 cm   LV E/e' lateral: 8.6 LV IVS:        1.00 cm LVOT diam:     2.50 cm LV SV:         42 LV SV Index:   16 LVOT Area:     4.91 cm  RIGHT VENTRICLE             IVC RV S prime:     10.30 cm/s  IVC diam: 2.00 cm TAPSE (M-mode): 1.8 cm LEFT ATRIUM              Index        RIGHT ATRIUM           Index LA diam:        4.70 cm  1.80 cm/m   RA Area:     19.20 cm LA Vol (A2C):   87.8 ml  33.71 ml/m  RA Volume:   55.70 ml  21.39 ml/m LA Vol (A4C):   108.0 ml 41.47 ml/m LA Biplane Vol: 97.7 ml  37.51 ml/m  AORTIC VALVE AV Area (Vmax):    3.21 cm  AV Area (Vmean):   2.82 cm AV Area (VTI):     3.18 cm AV Vmax:           79.50 cm/s AV Vmean:          56.000 cm/s AV VTI:            0.133 m AV Peak Grad:      2.5 mmHg AV Mean Grad:      1.0 mmHg LVOT Vmax:         51.96 cm/s LVOT Vmean:        32.120 cm/s LVOT VTI:          0.086 m LVOT/AV VTI ratio: 0.65  AORTA Ao Root diam: 3.50 cm Ao Asc diam:  3.70 cm MITRAL VALVE MV Area (PHT): 5.97 cm    SHUNTS MV Decel Time: 127 msec    Systemic VTI:  0.09 m MV E velocity: 70.00 cm/s  Systemic  Diam: 2.50 cm MV A velocity: 44.30 cm/s MV E/A ratio:  1.58 Carlyle Dolly MD Electronically signed by Carlyle Dolly MD Signature Date/Time: 05/31/2022/3:38:47 PM    Final       Leevon Upperman T. [place]  If 7PM-7AM, please contact night-coverage [email or web address] 06/01/2022, 9:58 AM

## 2022-06-01 NOTE — Progress Notes (Signed)
[facility] for heparin Indication: chest pain/ACS  No Known Allergies  Patient Measurements: Height: 6\' 2"  (188 cm) Weight: 133.5 kg (294 lb 6.4 oz) IBW/kg (Calculated) : 82.2 Heparin Dosing Weight: 115kg  Vital Signs: Temp: 99.5 F (37.5 C) (03/31 0750) Temp Source: Oral (03/31 0750) BP: 128/79 (03/31 1040) Pulse Rate: 107 (03/31 0827)  Labs: Recent Labs    05/31/22 0355 05/31/22 0551 05/31/22 1105 05/31/22 1741 06/01/22 0218 06/01/22 0540 06/01/22 1015  HGB 15.2  17.0  --   --   --  15.2  --   --   HCT 47.1  50.0  --   --   --  45.3  --   --   PLT 267  --   --   --  256  --   --   HEPARINUNFRC  --    < > 0.39 0.31 0.26*  --  0.32  CREATININE 1.37*  --  1.18  --  1.32*  --   --   TROPONINIHS 316*   < > 3,215* 11,627*  --  >24,000*  --    < > = values in this interval not displayed.     Estimated Creatinine Clearance: 92.9 mL/min (A) (by C-G formula based on SCr of 1.32 mg/dL (H)).   Medical History: Past Medical History:  Diagnosis Date   Chronic systolic (congestive) heart failure (HCC)    Coronary artery disease    Hypertension     Assessment: 52yo male c/o chest and back pain x2h followed by SOB, initial troponin elevated. Pharmacy consulted to begin heparin. Possible cath on Monday.   Heparin level lower end of therapeutic range. Hgb/plt stable. Will re-check a confirmatory level. No issues with infusion or bleeding per RN.  Goal of Therapy:  Heparin level 0.3-0.7 units/ml Monitor platelets by anticoagulation protocol: Yes   Plan:  Continue IV heparin 1900 units/hr Check 6 hr heparin level (confirmatory) Monitor heparin levels and CBC daily Monitor for s/sx of bleeding  F/u cards plans   Francena Hanly, PharmD Pharmacy Resident  06/01/2022 11:40 AM

## 2022-06-01 NOTE — Progress Notes (Signed)
Patient currently refusing to take metoprolol. Patient stating it will not help him. Patient also reporting "50 out of 10" back pain. Bedside RN offered patient pain meds multiple times. Patient refusing stating they will not help.

## 2022-06-01 NOTE — Progress Notes (Signed)
ANTICOAGULATION CONSULT NOTE - Follow Up Consult  Pharmacy Consult for heparin Indication:  NSTEMI  Labs: Recent Labs    05/31/22 0355 05/31/22 0551 05/31/22 1105 05/31/22 1741 06/01/22 0218  HGB 15.2  17.0  --   --   --  15.2  HCT 47.1  50.0  --   --   --  45.3  PLT 267  --   --   --  256  HEPARINUNFRC  --   --  0.39 0.31 0.26*  CREATININE 1.37*  --  1.18  --  1.32*  TROPONINIHS 316* 541* 3,215* 11,627*  --     Assessment: 52yo male subtherapeutic on heparin with lower heparin level despite increased rate, had been trending down; no infusion issues or signs of bleeding per RN.  Goal of Therapy:  Heparin level 0.3-0.7 units/ml   Plan:  Will increase heparin infusion by 2 units/kg/hr to 1900 units/hr and check level in 6 hours.    Wynona Neat, PharmD, BCPS  06/01/2022,3:26 AM

## 2022-06-02 ENCOUNTER — Other Ambulatory Visit (HOSPITAL_COMMUNITY): Payer: Self-pay

## 2022-06-02 ENCOUNTER — Encounter (HOSPITAL_COMMUNITY): Payer: Self-pay | Admitting: Cardiology

## 2022-06-02 ENCOUNTER — Encounter (HOSPITAL_COMMUNITY)
Admission: EM | Disposition: A | Discharge status: Home / Self Care | Payer: Self-pay | Source: Home / Self Care | Attending: Student

## 2022-06-02 DIAGNOSIS — I5043 Acute on chronic combined systolic (congestive) and diastolic (congestive) heart failure: Secondary | ICD-10-CM

## 2022-06-02 DIAGNOSIS — I249 Acute ischemic heart disease, unspecified: Principal | ICD-10-CM | POA: Diagnosis present

## 2022-06-02 DIAGNOSIS — Z9189 Other specified personal risk factors, not elsewhere classified: Secondary | ICD-10-CM

## 2022-06-02 DIAGNOSIS — I2510 Atherosclerotic heart disease of native coronary artery without angina pectoris: Secondary | ICD-10-CM

## 2022-06-02 DIAGNOSIS — Z955 Presence of coronary angioplasty implant and graft: Secondary | ICD-10-CM

## 2022-06-02 DIAGNOSIS — E785 Hyperlipidemia, unspecified: Secondary | ICD-10-CM

## 2022-06-02 DIAGNOSIS — I4729 Other ventricular tachycardia: Secondary | ICD-10-CM

## 2022-06-02 HISTORY — PX: CORONARY STENT INTERVENTION: CATH118234

## 2022-06-02 HISTORY — PX: CORONARY ULTRASOUND/IVUS: CATH118244

## 2022-06-02 HISTORY — PX: LEFT HEART CATH AND CORONARY ANGIOGRAPHY: CATH118249

## 2022-06-02 LAB — RENAL FUNCTION PANEL
Albumin: 3.5 g/dL (ref 3.5–5.0)
Anion gap: 12 (ref 5–15)
BUN: 13 mg/dL (ref 6–20)
CO2: 24 mmol/L (ref 22–32)
Calcium: 9 mg/dL (ref 8.9–10.3)
Chloride: 101 mmol/L (ref 98–111)
Creatinine, Ser: 1.4 mg/dL — ABNORMAL HIGH (ref 0.61–1.24)
GFR, Estimated: 60 mL/min — ABNORMAL LOW (ref >60)
Glucose, Bld: 115 mg/dL — ABNORMAL HIGH (ref 70–99)
Phosphorus: 3.2 mg/dL (ref 2.5–4.6)
Potassium: 3.6 mmol/L (ref 3.5–5.1)
Sodium: 137 mmol/L (ref 135–145)

## 2022-06-02 LAB — POCT ACTIVATED CLOTTING TIME
Activated Clotting Time: 0 seconds
Activated Clotting Time: 704 seconds
Activated Clotting Time: 293 seconds

## 2022-06-02 LAB — HEPATIC FUNCTION PANEL
ALT: 33 U/L (ref 0–44)
AST: 133 U/L — ABNORMAL HIGH (ref 15–41)
Albumin: 3.4 g/dL — ABNORMAL LOW (ref 3.5–5.0)
Alkaline Phosphatase: 73 U/L (ref 38–126)
Bilirubin, Direct: 0.3 mg/dL — ABNORMAL HIGH (ref 0.0–0.2)
Indirect Bilirubin: 1.3 mg/dL — ABNORMAL HIGH (ref 0.3–0.9)
Total Bilirubin: 1.6 mg/dL — ABNORMAL HIGH (ref 0.3–1.2)
Total Protein: 8.1 g/dL (ref 6.5–8.1)

## 2022-06-02 LAB — CBC
HCT: 45.3 % (ref 39.0–52.0)
Hemoglobin: 14.5 g/dL (ref 13.0–17.0)
MCH: 26.8 pg (ref 26.0–34.0)
MCHC: 32 g/dL (ref 30.0–36.0)
MCV: 83.7 fL (ref 80.0–100.0)
Platelets: 247 10*3/uL (ref 150–400)
RBC: 5.41 MIL/uL (ref 4.22–5.81)
RDW: 16.1 % — ABNORMAL HIGH (ref 11.5–15.5)
WBC: 13.4 10*3/uL — ABNORMAL HIGH (ref 4.0–10.5)
nRBC: 0 % (ref 0.0–0.2)

## 2022-06-02 LAB — PROCALCITONIN: Procalcitonin: 0.34 ng/mL

## 2022-06-02 LAB — MAGNESIUM: Magnesium: 2.2 mg/dL (ref 1.7–2.4)

## 2022-06-02 LAB — HEPARIN LEVEL (UNFRACTIONATED): Heparin Unfractionated: 0.37 IU/mL (ref 0.30–0.70)

## 2022-06-02 LAB — HEMOGLOBIN A1C
Hgb A1c MFr Bld: 6 % — ABNORMAL HIGH (ref 4.8–5.6)
Mean Plasma Glucose: 126 mg/dL

## 2022-06-02 SURGERY — LEFT HEART CATH AND CORONARY ANGIOGRAPHY
Anesthesia: LOCAL

## 2022-06-02 MED ORDER — AZITHROMYCIN 250 MG PO TABS
500.0000 mg | ORAL_TABLET | Freq: Every day | ORAL | Status: AC
  Administered 2022-06-02 – 2022-06-04 (×3): 500 mg via ORAL
  Filled 2022-06-02 (×3): qty 2

## 2022-06-02 MED ORDER — SODIUM CHLORIDE 0.9% FLUSH
3.0000 mL | Freq: Two times a day (BID) | INTRAVENOUS | Status: DC
  Administered 2022-06-03 – 2022-06-04 (×2): 3 mL via INTRAVENOUS

## 2022-06-02 MED ORDER — LIDOCAINE HCL (PF) 1 % IJ SOLN
INTRAMUSCULAR | Status: AC
  Filled 2022-06-02: qty 30

## 2022-06-02 MED ORDER — LABETALOL HCL 5 MG/ML IV SOLN: 10.0000 mg | INTRAVENOUS | Status: AC | PRN

## 2022-06-02 MED ORDER — FUROSEMIDE 10 MG/ML IJ SOLN
INTRAMUSCULAR | Status: AC
  Filled 2022-06-02: qty 4

## 2022-06-02 MED ORDER — TICAGRELOR 90 MG PO TABS
ORAL_TABLET | ORAL | Status: AC
  Filled 2022-06-02: qty 2

## 2022-06-02 MED ORDER — HEPARIN SODIUM (PORCINE) 1000 UNIT/ML IJ SOLN
INTRAMUSCULAR | Status: DC | PRN
  Administered 2022-06-02 (×2): 6000 [IU] via INTRAVENOUS

## 2022-06-02 MED ORDER — VERAPAMIL HCL 2.5 MG/ML IV SOLN
INTRAVENOUS | Status: DC | PRN
  Administered 2022-06-02: 10 mL via INTRA_ARTERIAL

## 2022-06-02 MED ORDER — VERAPAMIL HCL 2.5 MG/ML IV SOLN
INTRAVENOUS | Status: AC
  Filled 2022-06-02: qty 2

## 2022-06-02 MED ORDER — HEPARIN SODIUM (PORCINE) 1000 UNIT/ML IJ SOLN
INTRAMUSCULAR | Status: AC
  Filled 2022-06-02: qty 10

## 2022-06-02 MED ORDER — MIDAZOLAM HCL 2 MG/2ML IJ SOLN
INTRAMUSCULAR | Status: DC | PRN
  Administered 2022-06-02 (×2): 1 mg via INTRAVENOUS

## 2022-06-02 MED ORDER — SODIUM CHLORIDE 0.9% FLUSH: 3.0000 mL | INTRAVENOUS | Status: DC | PRN

## 2022-06-02 MED ORDER — METOPROLOL SUCCINATE ER 50 MG PO TB24
50.0000 mg | ORAL_TABLET | Freq: Every day | ORAL | Status: DC
  Administered 2022-06-03 – 2022-06-04 (×2): 50 mg via ORAL
  Filled 2022-06-02 (×2): qty 1

## 2022-06-02 MED ORDER — SODIUM CHLORIDE 0.9 % IV SOLN
2.0000 g | INTRAVENOUS | Status: DC
  Administered 2022-06-02 – 2022-06-03 (×2): 2 g via INTRAVENOUS
  Filled 2022-06-02 (×3): qty 20

## 2022-06-02 MED ORDER — LIDOCAINE HCL (PF) 1 % IJ SOLN
INTRAMUSCULAR | Status: DC | PRN
  Administered 2022-06-02: 5 mL

## 2022-06-02 MED ORDER — ENOXAPARIN SODIUM 40 MG/0.4ML IJ SOSY
40.0000 mg | PREFILLED_SYRINGE | INTRAMUSCULAR | Status: DC
  Administered 2022-06-03: 40 mg via SUBCUTANEOUS
  Filled 2022-06-02 (×2): qty 0.4

## 2022-06-02 MED ORDER — FENTANYL CITRATE (PF) 100 MCG/2ML IJ SOLN
INTRAMUSCULAR | Status: AC
  Filled 2022-06-02: qty 2

## 2022-06-02 MED ORDER — SODIUM CHLORIDE 0.9 % IV SOLN: 250.0000 mL | INTRAVENOUS | Status: DC | PRN

## 2022-06-02 MED ORDER — TICAGRELOR 90 MG PO TABS
ORAL_TABLET | ORAL | Status: DC | PRN
  Administered 2022-06-02: 180 mg via ORAL

## 2022-06-02 MED ORDER — MIDAZOLAM HCL 2 MG/2ML IJ SOLN
INTRAMUSCULAR | Status: AC
  Filled 2022-06-02: qty 2

## 2022-06-02 MED ORDER — TICAGRELOR 90 MG PO TABS
90.0000 mg | ORAL_TABLET | Freq: Two times a day (BID) | ORAL | Status: AC
  Administered 2022-06-02 – 2022-06-03 (×3): 90 mg via ORAL
  Filled 2022-06-02 (×3): qty 1

## 2022-06-02 MED ORDER — HYDRALAZINE HCL 20 MG/ML IJ SOLN: 5.0000 mg | INTRAMUSCULAR | Status: DC | PRN

## 2022-06-02 MED ORDER — FUROSEMIDE 10 MG/ML IJ SOLN
INTRAMUSCULAR | Status: DC | PRN
  Administered 2022-06-02: 40 mg via INTRAVENOUS

## 2022-06-02 MED ORDER — FENTANYL CITRATE (PF) 100 MCG/2ML IJ SOLN
INTRAMUSCULAR | Status: DC | PRN
  Administered 2022-06-02: 50 ug via INTRAVENOUS
  Administered 2022-06-02 (×2): 25 ug via INTRAVENOUS

## 2022-06-02 MED ORDER — HYDRALAZINE HCL 20 MG/ML IJ SOLN: 10.0000 mg | INTRAMUSCULAR | Status: AC | PRN

## 2022-06-02 MED ORDER — IOHEXOL 350 MG/ML SOLN
INTRAVENOUS | Status: DC | PRN
  Administered 2022-06-02: 175 mL

## 2022-06-02 MED ORDER — HEPARIN (PORCINE) IN NACL 1000-0.9 UT/500ML-% IV SOLN
INTRAVENOUS | Status: DC | PRN
  Administered 2022-06-02 (×2): 500 mL

## 2022-06-02 SURGICAL SUPPLY — 29 items
BALL SAPPHIRE NC24 3.75X18 (BALLOONS) ×1
BALL SAPPHIRE NC24 4.0X18 (BALLOONS) ×1
BALLN EMERGE MR 2.5X15 (BALLOONS) ×1
BALLOON EMERGE MR 2.5X15 (BALLOONS) IMPLANT
BALLOON SAPPHIRE NC24 3.75X18 (BALLOONS) IMPLANT
BALLOON SAPPHIRE NC24 4.0X18 (BALLOONS) IMPLANT
CATH 5FR JL3.5 JR4 ANG PIG MP (CATHETERS) IMPLANT
CATH INFINITI 5FR AL1 (CATHETERS) IMPLANT
CATH LAUNCHER 6FR AL1 (CATHETERS) IMPLANT
CATH LAUNCHER 6FR EBU 3.75 (CATHETERS) IMPLANT
CATH OPTICROSS HD (CATHETERS) IMPLANT
CATHETER LAUNCHER 6FR AL1 (CATHETERS) ×1
DEVICE RAD COMP TR BAND LRG (VASCULAR PRODUCTS) IMPLANT
GLIDESHEATH SLEND SS 6F .021 (SHEATH) IMPLANT
GUIDEWIRE INQWIRE 1.5J.035X260 (WIRE) IMPLANT
INQWIRE 1.5J .035X260CM (WIRE) ×1
KIT ENCORE 26 ADVANTAGE (KITS) IMPLANT
KIT HEART LEFT (KITS) ×1 IMPLANT
PACK CARDIAC CATHETERIZATION (CUSTOM PROCEDURE TRAY) ×1 IMPLANT
SHEATH PROBE COVER 6X72 (BAG) IMPLANT
SLED PULL BACK IVUS (MISCELLANEOUS) IMPLANT
STENT SYNERGY XD 3.50X32 (Permanent Stent) IMPLANT
STENT SYNERGY XD 4.0X20 (Permanent Stent) IMPLANT
SYNERGY XD 3.50X32 (Permanent Stent) ×1 IMPLANT
SYNERGY XD 4.0X20 (Permanent Stent) ×1 IMPLANT
SYR MEDRAD MARK 7 150ML (SYRINGE) ×1 IMPLANT
TRANSDUCER W/STOPCOCK (MISCELLANEOUS) ×1 IMPLANT
TUBING CIL FLEX 10 FLL-RA (TUBING) ×1 IMPLANT
WIRE ASAHI PROWATER 180CM (WIRE) IMPLANT

## 2022-06-02 NOTE — Progress Notes (Addendum)
Rounding Note    Patient Name: Clinton Johnson Date of Encounter: 06/02/2022  [place] Cardiologist: Donato Heinz, MD   Subjective   Patient seen and evaluated after heart cath. He is sitting up in bed on phone. Not in distress. He says he feels good. No complaints at this time.   Inpatient Medications    Scheduled Meds:  [START ON 06/03/2022] aspirin EC  81 mg Oral Daily   atorvastatin  80 mg Oral Daily   azithromycin  500 mg Oral Daily   dapagliflozin propanediol  10 mg Oral Daily   docusate sodium  100 mg Oral BID   [START ON 06/03/2022] enoxaparin (LOVENOX) injection  40 mg Subcutaneous Q24H   furosemide  40 mg Intravenous BID   losartan  25 mg Oral Daily   [START ON 06/03/2022] metoprolol succinate  50 mg Oral Daily   nicotine  14 mg Transdermal Daily   potassium chloride  20 mEq Oral QHS   sodium chloride flush  3 mL Intravenous Q12H   sodium chloride flush  3 mL Intravenous Q12H   sodium chloride flush  3 mL Intravenous Q12H   spironolactone  12.5 mg Oral Daily   ticagrelor  90 mg Oral BID   Continuous Infusions:  sodium chloride     cefTRIAXone (ROCEPHIN)  IV 2 g (06/02/22 1741)   PRN Meds: sodium chloride, acetaminophen **OR** acetaminophen, bisacodyl, hydrALAZINE, morphine injection, ondansetron **OR** ondansetron (ZOFRAN) IV, mouth rinse, oxyCODONE-acetaminophen, polyethylene glycol, sodium chloride flush   Vital Signs    Vitals:   06/02/22 1108 06/02/22 1144 06/02/22 1632 06/02/22 2003  BP: 95/72 126/86 109/72 (!) 117/97  Pulse: 90 93 97   Resp: 20 20 20 20   Temp:   100.3 F (37.9 C) 99.7 F (37.6 C)  TempSrc:   Oral Oral  SpO2:   94% 90%  Weight:      Height:        Intake/Output Summary (Last 24 hours) at 06/02/2022 2245 Last data filed at 06/02/2022 1800 Gross per 24 hour  Intake 559.96 ml  Output 2200 ml  Net -1640.04 ml      06/02/2022    4:34 AM 06/01/2022    5:09 AM 05/31/2022    3:44 AM  Last 3 Weights  Weight (lbs) 290  lb 12.8 oz 294 lb 6.4 oz 306 lb 1.6 oz  Weight (kg) 131.906 kg 133.539 kg 138.846 kg      Telemetry    NSR - Personally Reviewed  ECG    Resolution of T wave inversions in lateral leads and improvement in inferior ST depressions - Personally Reviewed  Physical Exam   GEN: No acute distress.  Sitting up bedside Neck: No JVD Cardiac: RRR, no murmurs, rubs, or gallops. No JVD, No LEE Respiratory: Clear to auscultation bilaterally. Post-cath - mild basal rales - increased WOB - ? Related to Brilinta GI: Soft, nontender, non-distended  MS: No edema; No deformity. Neuro:  Nonfocal, alert and oriented  Psych: Normal affect   Labs    High Sensitivity Troponin:   Recent Labs  Lab 05/31/22 0355 05/31/22 0551 05/31/22 1105 05/31/22 1741 06/01/22 0540  TROPONINIHS 316* 541* 3,215* 11,627* >24,000*     Chemistry Recent Labs  Lab 05/31/22 1105 06/01/22 0218 06/02/22 0155  NA 140 136 137  K 3.5 3.8 3.6  CL 102 102 101  CO2 24 22 24   GLUCOSE 110* 126* 115*  BUN 11 11 13   CREATININE 1.18 1.32* 1.40*  CALCIUM 9.2 9.3 9.0  MG 2.2  --  2.2  PROT  --   --  8.1  ALBUMIN  --   --  3.4*  3.5  AST  --   --  133*  ALT  --   --  33  ALKPHOS  --   --  73  BILITOT  --   --  1.6*  GFRNONAA >60 >60 60*  ANIONGAP 14 12 12     Lipids  Recent Labs  Lab 05/31/22 1105  CHOL 264*  TRIG 163*  HDL 43  LDLCALC 188*  CHOLHDL 6.1    Hematology Recent Labs  Lab 05/31/22 0355 06/01/22 0218 06/02/22 0155  WBC 7.6 10.2 13.4*  RBC 5.46 5.43 5.41  HGB 15.2  17.0 15.2 14.5  HCT 47.1  50.0 45.3 45.3  MCV 86.3 83.4 83.7  MCH 27.8 28.0 26.8  MCHC 32.3 33.6 32.0  RDW 16.5* 16.4* 16.1*  PLT 267 256 247   Thyroid  Recent Labs  Lab 05/31/22 1105  TSH 4.506*    BNP Recent Labs  Lab 05/31/22 0355  BNP 223.1*    DDimer No results for input(s): "DDIMER" in the last 168 hours.   Radiology    No new radiology studies  Cardiac Studies   05/31/22 ECHO: (Personally reviewed):  EF 30 to 35% " Mid to distal anteroseptal, anterolateral, anterior, inferoseptal walls are akinetic. Entire apex is akinetic.  Indeterminate diastolic parameters-LA is mildly dilated..  Normal RV size and function but unable to assess PAP.  Normal RAP.  Normal Mitral and Aortic Valves.    06/02/22 Cardiac Cath-PCI : Severe 2 vessel obstructive CAD  Culprit Lesion #1: Ramus lesion is 100% stenosed ->   A drug-eluting stent was successfully placed (with IVUS guidance) using a SYNERGY XD 3.50X32 - postdilated to 3.8 mm,  Post intervention, there is a 0% residual stenosis;    Lesion #2: Prox RCA to Mid RCA lesion is 85% stenosed ->   A drug-eluting stent was successfully placed (with IVUS guidance) using a SYNERGY XD 4.0X20,   Post intervention, there is a 0% residual stenosis;   Prox RCA lesion is 40% stenosed.   Previously placed Prox LAD to Mid LAD Stent is widely patent.   LV end diastolic pressure is moderately elevated. Diagnostic: Dominance: Right      Intervention  Patient Profile     52 y.o. male with a hx of CAD and prior stent to LAD, CHF with EF 35-40%, HLD, tobacco use and HTN who is being seen 05/31/2022 for the evaluation of chest pain and CHF at the request of Dr. Sidney Ace.   Assessment & Plan    Principal Problem:   Morbid (severe) obesity due to excess calories Active Problems:   NSTEMI (non-ST elevated myocardial infarction)   Acute on chronic combined systolic and diastolic CHF (congestive heart failure)   Coronary artery disease involving native coronary artery of native heart with unstable angina pectoris   Essential hypertension   Hyperlipidemia with target LDL less than 70   NSVT (nonsustained ventricular tachycardia)   Tobacco use   Stented coronary artery   Hypokalemia   At risk for sleep apnea   # NSTEMI / # 3 V CAD with UA - s/p 2 V PCI with Patent LAD stent Trops up to 2 -4K prior to cath. Remained hemodynamically stable.  Underwent PCI with DES placement to Ramus  lesion and prox-mid RCA this morning. Doing well post-cath with no complaints. Resolution  of T wave inversions in lateral leads and improvement in inferior ST depressions.  DAPT for at least one year. Currently on Brilinta in favor of plavix. He was complaining of some dyspnea which is a common, but harmless side effect of brilinta. This should resolve with time, but if it becomes intolerable, can switch to plavix.   - DAPTx 1 yr (then reduce to maintenance dose Brilinta 60 mg BID or Plavix 75 mg daily @ 1 yr): ASA 81 daily, - Brilitna 90mg  -> monitor for side effects as noted. - Atorvastatin 80mg  - Currently on Lopressor 25 mg twice daily, will convert to Toprol 50 mg twice daily tomorrow  ICM/# HFrEF 30-35% -> Acute on Chronic Combined Systolic and Diastolic Heart Failure Mild reduction in overall EF likely related to ischemic disease. ->  Mid to distal anteroseptal, anterolateral, anterior, inferoseptal walls are akinetic as is the entire apex.  I suspect at least part of the apical akinesis is related to his prior anterior infarct with the LAD stent.  Unfortunately was lost to follow-up with episode as was the case in the TAVR 2022.  EF had previously been 35 to 40% so this is not a dramatic reduction.  Likely little anterolateral wall is related to the occluded ramus which should hopefully improve as should the inferior/inferoseptal wall. He does have mild increase in creatinine, will hold off on increasing Spiro for now, but this is something that can be considered in the future. No hyperkalemia at this time. May benefit from switching losartan to [facility] as well.-  will need cost analysis by Pharm team.   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 May benefit from cardiac rehab.  - Lasix 40mg  BID - Losartan 25mg  daily - metop tartrate 25mg  BID -> have converted to Toprol 50 mg daily for tomorrow. - spiro 12.5mg  daily  - Farxiga 10mg  daily Elevated LVEDP in Cath Lab, received IV Lasix  in the morning this morning and also in the Cath Lab.  Will reevaluate tomorrow but continue 40 twice daily Lasix for now. ->  Had some orthopnea and dyspnea post cath, which may be potentially related to elevated EDP versus Brilinta  Has had some hypokalemia.  Stable.  Replete as necessary, I think he would tolerate spironolactone higher dose based on renal function stability.  # HTN Normotensive.  - optimizing GDMT as BP tolerates.  # HLD On high-dose atorvastatin  # Tobacco use Smoking cessation  # NSVT -> suspect this is related to ischemia Resolved   Morbid obesity/at risk for OSA.  Dietary modification recommended.  Cardiac rehab consulted for CAD/HFrEF-needs dietary counseling.    For questions or updates, please contact [place] Please consult [email or web address] for contact info under        Signed, Delene Ruffini, MD  06/02/2022, 10:37 AM     ATTENDING ATTESTATIONt I have seen, examined and evaluated the patient this Afternoon after his heart catheterization and PCI along with the Resident Physician-Greylon Elliot Gurney, MD. (the patient was actually seen and route to the heart catheterization and then after catheterization.  I personally reviewed reviewed the catheter lungs and discussed plan with Dr. [place].   After reviewing all the available data and chart, we discussed the patients laboratory, study & physical findings as well as symptoms in detail.  I agree with his findings, examination as well as impression recommendations as per our discussion.    Attending adjustments noted in italics.    He had significant post-cath  dyspnea.  Potentially related to Brilinta.Need to monitor symptoms.  Recommend taking doses with caffeine. Will need continued either Brilinta or Plavix plus aspirin Continue to titrate GDMT for Ischemic Cardiomyopathy/Acute on Chronic Combined Systolic and Diastolic Heart Failure  Anticipate converting to oral Lasix tomorrow, likely will  start with 40 mg daily.  Could also consider converting ARB to [facility] for discharge based on cost effectiveness, otherwise with recommend converting in the outpatient setting if BP would not tolerate.  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 ->  Unfortunately, he would not build to drive until he is seen in follow-up in the EF to show that it is about 40%.  I suspect that he may need more than just 1 more day post PCI to stabilize medications and renal function.  Make further adjustments tomorrow and anticipate discharge in 2 days.   Leonie Man, MD, MS Glenetta Hew, M.D., M.S. Interventional Cardiologist  [place]  Pager # [phone] Phone # [phone] [address]. [place], [place] [address]

## 2022-06-02 NOTE — Interval H&P Note (Signed)
History and Physical Interval Note:  06/02/2022 9:26 AM  Clinton Johnson  has presented today for surgery, with the diagnosis of NSTEMI.  The various methods of treatment have been discussed with the patient and family. After consideration of risks, benefits and other options for treatment, the patient has consented to  Procedure(s): LEFT HEART CATH AND CORONARY ANGIOGRAPHY (N/A) as a surgical intervention.  The patient's history has been reviewed, patient examined, no change in status, stable for surgery.  I have reviewed the patient's chart and labs.  Questions were answered to the patient's satisfaction.     Clinton Johnson [facility] 06/02/2022 9:26 AM Cath Lab Visit (complete for each Cath Lab visit)  Clinical Evaluation Leading to the Procedure:   ACS: Yes.    Non-ACS:    Anginal Classification: CCS IV  Anti-ischemic medical therapy: Maximal Therapy (2 or more classes of medications)  Non-Invasive Test Results: No non-invasive testing performed  Prior CABG: No previous CABG

## 2022-06-02 NOTE — Progress Notes (Signed)
Bedside RN brought patient down to cath lab with cath lab tech. RN gave cath lab nurse report. Nitro running @ 10 mcg/min. IVF @ 133.5 ml/hr. No questions.

## 2022-06-02 NOTE — Progress Notes (Addendum)
PROGRESS NOTE  Clinton Johnson [id number] DOB: December 25, 1970   PCP: Patient, No Pcp Per  Patient is from: Home  DOA: 05/31/2022 LOS: 2  Chief complaints Chief Complaint  Patient presents with   Shortness of Breath     Brief Narrative / Interim history: 52 year old M with PMH of CAD s/p DES to LAD in 2020, combined CHF, HTN, tobacco use disorder, morbid obesity and noncompliance with meds presenting with substernal chest pain and SOB that woke him up from sleep.  Chest pain improved with nitroglycerin patch.  In ED, slightly tachycardic and desaturated to 80s on RA requiring supplemental oxygen.  Troponin 316.  BNP 223.  CXR with diffuse hazy opacity bilaterally with patchy airspace disease at lung bases.  EKG with ST to 108, LAE and STD in V3 and inferior leads.  Patient was given full dose aspirin, IV Lasix, Nitropaste and started on IV heparin.  Cardiology consulted.  Admitted for non-STEMI and CHF exacerbation.  TTE with LVEF of 30 to 35% (previously 35-40), some RWMA.  LHC with severe 2v-CAD treated with DES stent on 4/1.    Subjective: Seen and examined this afternoon after he returned from left heart catheterization.  No major events overnight of this morning.  Sitting on the edge of the bed.  Feels better other than intermittent dyspnea.   Objective: Vitals:   06/02/22 1101 06/02/22 1103 06/02/22 1108 06/02/22 1144  BP:  114/86 95/72 126/86  Pulse: 92 96 90 93  Resp: 17 (!) 22 20 20   Temp:      TempSrc:      SpO2: 98% 99%    Weight:      Height:        Examination:  GENERAL: No apparent distress.  Nontoxic. HEENT: MMM.  Vision and hearing grossly intact.  NECK: Supple.  No apparent JVD.  RESP:  No IWOB.  Fair aeration bilaterally. CVS:  RRR. Heart sounds normal.  ABD/GI/GU: BS+. Abd soft, NTND.  MSK/EXT:  Moves extremities. No apparent deformity. No edema.  SKIN: no apparent skin lesion or wound NEURO: Awake, alert and oriented appropriately.  No apparent focal  neuro deficit. PSYCH: Calm. Normal affect.   Procedures:  4/1-LHC with severe 2v-CAD treated with DES stents.  Microbiology summarized: COVID-19 PCR nonreactive  Assessment and plan: Principal Problem:   NSTEMI (non-ST elevated myocardial infarction) Active Problems:   Tobacco use   Essential hypertension   Acute on chronic combined systolic and diastolic CHF (congestive heart failure)   Hyperlipidemia   At risk for sleep apnea   Morbid (severe) obesity due to excess calories   Stented coronary artery   NSVT (nonsustained ventricular tachycardia)  NSTEMI: Presents with dyspnea, substernal chest pain.  Troponin 300>>> 24,000.  Has history of CAD and DES to proximal LAD in 2020.  LHC in 2022 with patent LAD stent and 70% ramus and 60% mid RCA stenosis that has been managed medically.  Doubt compliance with meds.  UDS positive for opiate.  LDL 188.  A1c 6.0%. -S/p LHC with DES stent to ramus and RCA lesions. -Cardiology on board-on aspirin, Brilinta, statin,  Lopressor, losartan  -Counseled on importance of compliance with meds  Acute on chronic combined CHF: In the setting of non-STEMI and noncompliance with meds.  BNP 223.  CXR concerning for pulmonary edema.  Grossly euvolemic on exam.  TTE as above.  Started on IV Lasix.  Net -4 L.  Cr stable. -Cardiology on board -Continue IV Lasix 40 mg twice daily -GDMT  losartan, Aldactone, Lopressor -Strict intake and output, daily weight, renal functions and electrolytes   Acute hypoxic respiratory failure: Was saturating at 81-86% on room air and 94% on high flow nasal cannula while in ED.  Respiratory failure likely due to CHF.  However, leukocytosis rising with slightly elevated procalcitonin concerning for pneumonia.  He has productive cough as well. -Start CAP coverage with ceftriaxone and azithromycin -Manage ACS and CHF as above  Community-acquired pneumonia: Has shortness of breath, productive cough, rising leukocytosis and  slightly elevated procalcitonin. -Antibiotics as above  History of CAD s/p DES to proximal LAD in 2020.  LHC in 2022 with patent LAD stent, 70% ramus and 60% mid RCA stenosis.  LHC this admission with 100% lesion in ramus, 85% lesion in prox to mid RCA and 40% in prox RCA -S/p DES to ramus and RCA lesions. -Management as above  NSVT: -Lopressor as above -Optimize electrolytes  Essential hypertension: Normotensive -Cardiac meds as above  Tobacco use disorder -Encourage smoking cessation -Nicotine patch  Left upper back pain: Looks musculoskeletal. -Analgesics as needed  Morbid obesity Body mass index is 37.34 kg/m.          DVT prophylaxis:  enoxaparin (LOVENOX) injection 40 mg Start: 06/03/22 0800  Code Status: Full code Family Communication: None at bedside Level of care: Progressive Status is: Inpatient Remains inpatient appropriate because: Non-STEMI and acute CHF   Final disposition: Home once medically cleared Consultants:  Cardiology  55 minutes with more than 50% spent in reviewing records, counseling patient/family and coordinating care.   Sch Meds:  Scheduled Meds:  [START ON 06/03/2022] aspirin EC  81 mg Oral Daily   atorvastatin  80 mg Oral Daily   dapagliflozin propanediol  10 mg Oral Daily   docusate sodium  100 mg Oral BID   [START ON 06/03/2022] enoxaparin (LOVENOX) injection  40 mg Subcutaneous Q24H   furosemide  40 mg Intravenous BID   losartan  25 mg Oral Daily   metoprolol tartrate  25 mg Oral BID   nicotine  14 mg Transdermal Daily   potassium chloride  20 mEq Oral QHS   sodium chloride flush  3 mL Intravenous Q12H   sodium chloride flush  3 mL Intravenous Q12H   sodium chloride flush  3 mL Intravenous Q12H   spironolactone  12.5 mg Oral Daily   ticagrelor  90 mg Oral BID   Continuous Infusions:  sodium chloride     PRN Meds:.sodium chloride, acetaminophen **OR** acetaminophen, bisacodyl, hydrALAZINE, hydrALAZINE, labetalol, morphine  injection, ondansetron **OR** ondansetron (ZOFRAN) IV, mouth rinse, oxyCODONE-acetaminophen, polyethylene glycol, sodium chloride flush  Antimicrobials: Anti-infectives (From admission, onward)    None        I have personally reviewed the following labs and images: CBC: Recent Labs  Lab 05/31/22 0355 06/01/22 0218 06/02/22 0155  WBC 7.6 10.2 13.4*  HGB 15.2  17.0 15.2 14.5  HCT 47.1  50.0 45.3 45.3  MCV 86.3 83.4 83.7  PLT 267 256 247   BMP &GFR Recent Labs  Lab 05/31/22 0355 05/31/22 1105 06/01/22 0218 06/02/22 0155  NA 137  140 140 136 137  K 3.1*  6.1* 3.5 3.8 3.6  CL 103 102 102 101  CO2 25 24 22 24   GLUCOSE 127* 110* 126* 115*  BUN 15 11 11 13   CREATININE 1.37* 1.18 1.32* 1.40*  CALCIUM 8.9 9.2 9.3 9.0  MG  --  2.2  --  2.2  PHOS  --   --   --  3.2   Estimated Creatinine Clearance: 87.1 mL/min (A) (by C-G formula based on SCr of 1.4 mg/dL (H)). Liver & Pancreas: Recent Labs  Lab 06/02/22 0155  AST 133*  ALT 33  ALKPHOS 73  BILITOT 1.6*  PROT 8.1  ALBUMIN 3.4*  3.5   No results for input(s): "LIPASE", "AMYLASE" in the last 168 hours. No results for input(s): "AMMONIA" in the last 168 hours. Diabetic: Recent Labs    05/31/22 1105  HGBA1C 6.0*   No results for input(s): "GLUCAP" in the last 168 hours. Cardiac Enzymes: No results for input(s): "CKTOTAL", "CKMB", "CKMBINDEX", "TROPONINI" in the last 168 hours. No results for input(s): "PROBNP" in the last 8760 hours. Coagulation Profile: No results for input(s): "INR", "PROTIME" in the last 168 hours. Thyroid Function Tests: Recent Labs    05/31/22 1105  TSH 4.506*   Lipid Profile: Recent Labs    05/31/22 1105  CHOL 264*  HDL 43  LDLCALC 188*  TRIG 163*  CHOLHDL 6.1   Anemia Panel: No results for input(s): "VITAMINB12", "FOLATE", "FERRITIN", "TIBC", "IRON", "RETICCTPCT" in the last 72 hours. Urine analysis: No results found for: "COLORURINE", "APPEARANCEUR", "LABSPEC",  "PHURINE", "GLUCOSEU", "HGBUR", "BILIRUBINUR", "KETONESUR", "PROTEINUR", "UROBILINOGEN", "NITRITE", "LEUKOCYTESUR" Sepsis Labs: Invalid input(s): "PROCALCITONIN", "LACTICIDVEN"  Microbiology: Recent Results (from the past 240 hour(s))  SARS Coronavirus 2 by RT PCR (hospital order, performed in [facility] hospital lab) *cepheid single result test* Anterior Nasal Swab     Status: None   Collection Time: 05/31/22  3:58 AM   Specimen: Anterior Nasal Swab  Result Value Ref Range Status   SARS Coronavirus 2 by RT PCR NEGATIVE NEGATIVE Final    Comment: (NOTE) SARS-CoV-2 target nucleic acids are NOT DETECTED.  The SARS-CoV-2 RNA is generally detectable in upper and lower respiratory specimens during the acute phase of infection. The lowest concentration of SARS-CoV-2 viral copies this assay can detect is 250 copies / mL. A negative result does not preclude SARS-CoV-2 infection and should not be used as the sole basis for treatment or other patient management decisions.  A negative result may occur with improper specimen collection / handling, submission of specimen other than nasopharyngeal swab, presence of viral mutation(s) within the areas targeted by this assay, and inadequate number of viral copies (<250 copies / mL). A negative result must be combined with clinical observations, patient history, and epidemiological information.  Fact Sheet for Patients:   https://www.patel.info/  Fact Sheet for Healthcare Providers: https://hall.com/  This test is not yet approved or  cleared by the [place] FDA and has been authorized for detection and/or diagnosis of SARS-CoV-2 by FDA under an Emergency Use Authorization (EUA).  This EUA will remain in effect (meaning this test can be used) for the duration of the COVID-19 declaration under Section 564(b)(1) of the Act, 21 U.S.C. section 360bbb-3(b)(1), unless the authorization is terminated  or revoked sooner.  Performed at [facility], [address] [place], [place] [address]     Radiology Studies: CARDIAC CATHETERIZATION  Result Date: 06/02/2022   Ramus lesion is 100% stenosed.   Prox RCA to Mid RCA lesion is 85% stenosed.   Prox RCA lesion is 40% stenosed.   Non-stenotic Prox LAD to Mid LAD lesion was previously treated.   A drug-eluting stent was successfully placed using a SYNERGY XD 3.50X32.   A drug-eluting stent was successfully placed using a SYNERGY XD 4.0X20.   Post intervention, there is a 0% residual stenosis.   Post intervention,  there is a 0% residual stenosis.   LV end diastolic pressure is moderately elevated. Severe 2 vessel obstructive CAD. Compared to last Cath in November 2022 ramus intermediate is now occluded with collaterals. Lesion in the mid RCA has progressed with ulcerative plaque. The LAD is widely patent. Moderately elevated LVEDP 31 mm Hg Successful PCI of the ramus intermediate with DES x 1 and IVUS guidance Successful PCI of the mid RCA with DES x 1 and IVUS guidance Plan: optimize medical therapy for CHF. DAPT for at least one year.      Annella Prowell T. [place]  If 7PM-7AM, please contact night-coverage [email or web address] 06/02/2022, 2:30 PM

## 2022-06-02 NOTE — Progress Notes (Addendum)
   Heart Failure Stewardship Pharmacist Progress Note   PCP: Patient, No Pcp Per PCP-Cardiologist: Donato Heinz, MD    HPI:  52 yo M with PMH of CHF, CAD, HTN, HLD, and medication noncompliance.  Presented to the ED on 3/30 with chest pain, back pain, and shortness of breath. EKG reviewed with interventionalist, not STEMI. CXR with possible edema vs pneumonitis. ECHO 3/30 showed LVEF 30-35% (35-40% in 01/2021), regional wall motion abnormalities, RV normal. Taken for cath on 4/1 and found to have severe 2 vessel obstructive CAD - occluded ramus and 85% stenosis prox to mid RCA. S/p 2 DES. LVEDP 31.   Current HF Medications: Diuretic: furosemide 40 mg IV BID Beta Blocker: metoprolol tartrate 25 mg BID ACE/ARB/ARNI: losartan 25 mg daily MRA: spironolactone 12.5 mg daily SGLT2i: Farxiga 10 mg daily  Prior to admission HF Medications: Diuretic: furosemide 40 mg daily ACE/ARB/ARNI: losartan 25 mg daily  Pertinent Lab Values: Serum creatinine 1.40, BUN 13, Potassium 3.6, Sodium 137, BNP 223.1, Magnesium 2.2, A1c 6.0   Vital Signs: Weight: 290 lbs (admission weight: 306 lbs) Blood pressure: 100-120/70s  Heart rate: 90s  I/O: -2.3L yesterday; net -5.2L  Medication Assistance / Insurance Benefits Check: Does the patient have prescription insurance?  Yes Type of insurance plan: [employer]  Outpatient Pharmacy:  Prior to [facility]: [facility] Is the patient willing to use [place] at discharge? Yes Is the patient willing to transition their outpatient pharmacy to utilize a [facility] outpatient pharmacy?   Pending    Assessment: 1. Acute on chronic systolic CHF (LVEF 99991111), due to ICM. NYHA class III symptoms. - Continue furosemide 40 mg IV BID. Volume up on cath. Strict I/Os and daily weights. Keep K>4 and Mg>2. - Continue metoprolol tartrate 25 mg BID, will need to consolidate to succinate prior to discharge with HFrEF -  Continue losartan 25 mg daily - Continue spironolactone 12.5 mg daily - Continue Farxiga 10 mg daily   Plan: 1) Medication changes recommended at this time: - Continue current regimen - Switch to metoprolol succinate at discharge  2) Patient assistance: - Patient has not paid insurance premium yet, unable to run test claims at this time.  - Encouraged patient to contact insurance before discharge. Patient support line [phone]. - Can use monthly copay cards for meds   3)  Education  - Patient has been educated on current HF medications and potential additions to HF medication regimen - Patient verbalizes understanding that over the next few months, these medication doses may change and more medications may be added to optimize HF regimen - Patient has been educated on basic disease state pathophysiology and goals of therapy   Kerby Nora, PharmD, BCPS Heart Failure Stewardship Pharmacist Phone [phone]

## 2022-06-02 NOTE — Progress Notes (Addendum)
Pt continues to complain of intermittent shortness of breath. Earlier in shift O2 sats 90-92 on RA/RR 19, O2 applied via [place] 2L. Sats 98-100%. Pt took O2 off and sats have maintained 98-100% on room air. Lungs are diminished but otherwise clear. Pt states he does not want to wear pulse ox on his finger-I attempted to educate him on the fact that is how I monitor his oxygen level and strongly encouraged him to leave it on.

## 2022-06-03 ENCOUNTER — Encounter (HOSPITAL_COMMUNITY): Payer: Self-pay | Admitting: Internal Medicine

## 2022-06-03 ENCOUNTER — Other Ambulatory Visit (HOSPITAL_COMMUNITY): Payer: Self-pay

## 2022-06-03 DIAGNOSIS — Z91199 Patient's noncompliance with other medical treatment and regimen due to unspecified reason: Secondary | ICD-10-CM

## 2022-06-03 DIAGNOSIS — I25110 Atherosclerotic heart disease of native coronary artery with unstable angina pectoris: Secondary | ICD-10-CM

## 2022-06-03 DIAGNOSIS — E876 Hypokalemia: Secondary | ICD-10-CM

## 2022-06-03 DIAGNOSIS — I249 Acute ischemic heart disease, unspecified: Secondary | ICD-10-CM

## 2022-06-03 DIAGNOSIS — N179 Acute kidney failure, unspecified: Secondary | ICD-10-CM

## 2022-06-03 LAB — CBC
HCT: 43.5 % (ref 39.0–52.0)
Hemoglobin: 14.8 g/dL (ref 13.0–17.0)
MCH: 28.2 pg (ref 26.0–34.0)
MCHC: 34 g/dL (ref 30.0–36.0)
MCV: 83 fL (ref 80.0–100.0)
Platelets: 249 10*3/uL (ref 150–400)
RBC: 5.24 MIL/uL (ref 4.22–5.81)
RDW: 16.1 % — ABNORMAL HIGH (ref 11.5–15.5)
WBC: 12.3 10*3/uL — ABNORMAL HIGH (ref 4.0–10.5)
nRBC: 0 % (ref 0.0–0.2)

## 2022-06-03 LAB — BASIC METABOLIC PANEL
Anion gap: 13 (ref 5–15)
BUN: 22 mg/dL — ABNORMAL HIGH (ref 6–20)
CO2: 21 mmol/L — ABNORMAL LOW (ref 22–32)
Calcium: 9.1 mg/dL (ref 8.9–10.3)
Chloride: 102 mmol/L (ref 98–111)
Creatinine, Ser: 1.7 mg/dL — ABNORMAL HIGH (ref 0.61–1.24)
GFR, Estimated: 47 mL/min — ABNORMAL LOW (ref >60)
Glucose, Bld: 113 mg/dL — ABNORMAL HIGH (ref 70–99)
Potassium: 3.5 mmol/L (ref 3.5–5.1)
Sodium: 136 mmol/L (ref 135–145)

## 2022-06-03 LAB — PROCALCITONIN: Procalcitonin: 0.71 ng/mL

## 2022-06-03 MED ORDER — PRASUGREL HCL 10 MG PO TABS
60.0000 mg | ORAL_TABLET | Freq: Once | ORAL | Status: AC
  Administered 2022-06-04: 60 mg via ORAL
  Filled 2022-06-03: qty 6

## 2022-06-03 MED ORDER — PRASUGREL HCL 10 MG PO TABS: 10.0000 mg | ORAL_TABLET | Freq: Every day | ORAL | Status: DC

## 2022-06-03 NOTE — Progress Notes (Signed)
PROGRESS NOTE  Clinton Johnson [id number] DOB: 11/05/1970   PCP: Patient, No Pcp Per  Patient is from: Home  DOA: 05/31/2022 LOS: 3  Chief complaints Chief Complaint  Patient presents with   Shortness of Breath     Brief Narrative / Interim history: 52 year old M with PMH of CAD s/p DES to LAD in 2020, combined CHF, HTN, tobacco use disorder, morbid obesity and noncompliance with meds presenting with substernal chest pain and SOB that woke him up from sleep.  Chest pain improved with nitroglycerin patch.  In ED, slightly tachycardic and desaturated to 80s on RA requiring supplemental oxygen.  Troponin 316.  BNP 223.  CXR with diffuse hazy opacity bilaterally with patchy airspace disease at lung bases.  EKG with ST to 108, LAE and STD in V3 and inferior leads.  Patient was given full dose aspirin, IV Lasix, Nitropaste and started on IV heparin.  Cardiology consulted.  Admitted for non-STEMI and CHF exacerbation.  Patient also has some productive cough with worsening leukocytosis and marginally elevated procalcitonin.  Started on ceftriaxone and azithromycin for CAP.  TTE with LVEF of 30 to 35% (previously 35-40), some RWMA.  LHC with severe 2v-CAD treated with DES stent on 4/1.  Cardiology following.    Subjective: Seen and examined earlier this morning.  Reportedly had intermittent shortness of breath overnight.  Feels better this morning.  Asking about when he can go home.  He denies chest pain or shortness of breath at the time.  Creatinine slightly up today.   Objective: Vitals:   06/03/22 0610 06/03/22 0616 06/03/22 0756 06/03/22 0841  BP:   111/84 106/72  Pulse:   90 97  Resp: 17  18   Temp: 99 F (37.2 C)  98.3 F (36.8 C)   TempSrc: Oral  Oral   SpO2: 94%     Weight:  128.8 kg    Height:        Examination:  GENERAL: No apparent distress.  Nontoxic. HEENT: MMM.  Vision and hearing grossly intact.  NECK: Supple.  No apparent JVD.  RESP:  No IWOB.  Fair aeration  bilaterally. CVS:  RRR. Heart sounds normal.  ABD/GI/GU: BS+. Abd soft, NTND.  MSK/EXT:   No apparent deformity. Moves extremities.  Trace BLE edema. SKIN: no apparent skin lesion or wound NEURO: Awake and alert. Oriented appropriately.  No apparent focal neuro deficit. PSYCH: Calm. Normal affect.   Procedures:  4/1-LHC with severe 2v-CAD treated with DES stents.  Microbiology summarized: COVID-19 PCR nonreactive  Assessment and plan: Principal Problem:   NSTEMI (non-ST elevated myocardial infarction) Active Problems:   Tobacco use   Essential hypertension   Hypokalemia   Acute on chronic combined systolic and diastolic CHF (congestive heart failure)   Hyperlipidemia with target LDL less than 70   At risk for sleep apnea   Coronary artery disease involving native coronary artery of native heart with unstable angina pectoris   Morbid (severe) obesity due to excess calories   Stented coronary artery   NSVT (nonsustained ventricular tachycardia)   ACS (acute coronary syndrome)  NSTEMI: Presents with dyspnea, substernal chest pain.  Troponin 300>>> 24,000.  Has history of CAD and DES to proximal LAD in 2020.  LHC in 2022 with patent LAD stent and 70% ramus and 60% mid RCA stenosis that has been managed medically.  Doubt compliance with meds.  UDS positive for opiate.  LDL 188.  A1c 6.0%. -S/p LHC with DES stent to ramus and RCA lesions. -  Cardiology on board-on aspirin, Brilinta, statin,  Lopressor, losartan  -Counseled on importance of compliance with meds  Acute on chronic combined CHF: In the setting of non-STEMI and noncompliance with meds.  BNP 223.  CXR concerning for pulmonary edema.  Grossly euvolemic on exam.  TTE as above.  Started on IV Lasix.  Net -6.4 L.  Creatinine slightly up. -Cardiology on board-holding further IV Lasix -GDMT losartan, Aldactone, Toprol XL, Farxiga, -Strict intake and output, daily weight, renal functions and electrolytes   Acute hypoxic  respiratory failure: Was saturating at 81-86% on room air and 94% on high flow nasal cannula while in ED.  Respiratory failure likely due to CHF.  However, leukocytosis rising with slightly elevated procalcitonin concerning for pneumonia.  He has productive cough as well.  Respiratory failure resolved. -Continue ceftriaxone and azithromycin. -Manage ACS and CHF as above  Community-acquired pneumonia: Has shortness of breath, productive cough, rising leukocytosis and slightly elevated procalcitonin. -Antibiotics as above  History of CAD s/p DES to proximal LAD in 2020.  LHC in 2022 with patent LAD stent, 70% ramus and 60% mid RCA stenosis.  LHC this admission with 100% lesion in ramus, 85% lesion in prox to mid RCA and 40% in prox RCA -S/p DES to ramus and RCA lesions. -Management as above  AKI: Likely due to diuretics and heart catheterization.  Baseline seems to be around 1.1-1.2. Recent Labs    07/30/21 1435 05/31/22 0355 05/31/22 1105 06/01/22 0218 06/02/22 0155 06/03/22 0717  BUN 10 15 11 11 13  22*  CREATININE 1.30* 1.37* 1.18 1.32* 1.40* 1.70*  -Agree with holding IV diuretics today -Recheck in the morning  NSVT: -Lopressor as above -Optimize electrolytes  Essential hypertension: Normotensive -Cardiac meds as above  Tobacco use disorder -Encourage smoking cessation -Nicotine patch  Left upper back pain: Looks musculoskeletal. -Analgesics as needed  At risk for sleep apnea: -Needs outpatient sleep study.  Noncompliance with medication -Counseled.  Morbid obesity Body mass index is 36.46 kg/m.          DVT prophylaxis:  enoxaparin (LOVENOX) injection 40 mg Start: 06/03/22 0800  Code Status: Full code Family Communication: None at bedside Level of care: Telemetry Cardiac Status is: Inpatient Remains inpatient appropriate because: Non-STEMI, acute CHF, respiratory failure and pneumonia   Final disposition: Home once medically cleared Consultants:   Cardiology  55 minutes with more than 50% spent in reviewing records, counseling patient/family and coordinating care.   Sch Meds:  Scheduled Meds:  aspirin EC  81 mg Oral Daily   atorvastatin  80 mg Oral Daily   azithromycin  500 mg Oral Daily   dapagliflozin propanediol  10 mg Oral Daily   docusate sodium  100 mg Oral BID   enoxaparin (LOVENOX) injection  40 mg Subcutaneous Q24H   losartan  25 mg Oral Daily   metoprolol succinate  50 mg Oral Daily   nicotine  14 mg Transdermal Daily   potassium chloride  20 mEq Oral QHS   [START ON 06/05/2022] prasugrel  10 mg Oral Daily   [START ON 06/04/2022] prasugrel  60 mg Oral Once   sodium chloride flush  3 mL Intravenous Q12H   sodium chloride flush  3 mL Intravenous Q12H   sodium chloride flush  3 mL Intravenous Q12H   spironolactone  12.5 mg Oral Daily   ticagrelor  90 mg Oral BID   Continuous Infusions:  sodium chloride     cefTRIAXone (ROCEPHIN)  IV Stopped (06/02/22 1813)   PRN  Meds:.sodium chloride, acetaminophen **OR** acetaminophen, bisacodyl, hydrALAZINE, morphine injection, ondansetron **OR** ondansetron (ZOFRAN) IV, mouth rinse, oxyCODONE-acetaminophen, polyethylene glycol, sodium chloride flush  Antimicrobials: Anti-infectives (From admission, onward)    Start     Dose/Rate Route Frequency Ordered Stop   06/02/22 1715  cefTRIAXone (ROCEPHIN) 2 g in sodium chloride 0.9 % 100 mL IVPB        2 g 200 mL/hr over 30 Minutes Intravenous Every 24 hours 06/02/22 1629 06/07/22 1714   06/02/22 1715  azithromycin (ZITHROMAX) tablet 500 mg        500 mg Oral Daily 06/02/22 1629 06/05/22 0959        I have personally reviewed the following labs and images: CBC: Recent Labs  Lab 05/31/22 0355 06/01/22 0218 06/02/22 0155 06/03/22 0717  WBC 7.6 10.2 13.4* 12.3*  HGB 15.2  17.0 15.2 14.5 14.8  HCT 47.1  50.0 45.3 45.3 43.5  MCV 86.3 83.4 83.7 83.0  PLT 267 256 247 249   BMP &GFR Recent Labs  Lab 05/31/22 0355  05/31/22 1105 06/01/22 0218 06/02/22 0155 06/03/22 0717  NA 137  140 140 136 137 136  K 3.1*  6.1* 3.5 3.8 3.6 3.5  CL 103 102 102 101 102  CO2 25 24 22 24  21*  GLUCOSE 127* 110* 126* 115* 113*  BUN 15 11 11 13  22*  CREATININE 1.37* 1.18 1.32* 1.40* 1.70*  CALCIUM 8.9 9.2 9.3 9.0 9.1  MG  --  2.2  --  2.2  --   PHOS  --   --   --  3.2  --    Estimated Creatinine Clearance: 70.8 mL/min (A) (by C-G formula based on SCr of 1.7 mg/dL (H)). Liver & Pancreas: Recent Labs  Lab 06/02/22 0155  AST 133*  ALT 33  ALKPHOS 73  BILITOT 1.6*  PROT 8.1  ALBUMIN 3.4*  3.5   No results for input(s): "LIPASE", "AMYLASE" in the last 168 hours. No results for input(s): "AMMONIA" in the last 168 hours. Diabetic: No results for input(s): "HGBA1C" in the last 72 hours.  No results for input(s): "GLUCAP" in the last 168 hours. Cardiac Enzymes: No results for input(s): "CKTOTAL", "CKMB", "CKMBINDEX", "TROPONINI" in the last 168 hours. No results for input(s): "PROBNP" in the last 8760 hours. Coagulation Profile: No results for input(s): "INR", "PROTIME" in the last 168 hours. Thyroid Function Tests: No results for input(s): "TSH", "T4TOTAL", "FREET4", "T3FREE", "THYROIDAB" in the last 72 hours.  Lipid Profile: No results for input(s): "CHOL", "HDL", "LDLCALC", "TRIG", "CHOLHDL", "LDLDIRECT" in the last 72 hours.  Anemia Panel: No results for input(s): "VITAMINB12", "FOLATE", "FERRITIN", "TIBC", "IRON", "RETICCTPCT" in the last 72 hours. Urine analysis: No results found for: "COLORURINE", "APPEARANCEUR", "LABSPEC", "PHURINE", "GLUCOSEU", "HGBUR", "BILIRUBINUR", "KETONESUR", "PROTEINUR", "UROBILINOGEN", "NITRITE", "LEUKOCYTESUR" Sepsis Labs: Invalid input(s): "PROCALCITONIN", "LACTICIDVEN"  Microbiology: Recent Results (from the past 240 hour(s))  SARS Coronavirus 2 by RT PCR (hospital order, performed in [facility] hospital lab) *cepheid single result test* Anterior Nasal Swab      Status: None   Collection Time: 05/31/22  3:58 AM   Specimen: Anterior Nasal Swab  Result Value Ref Range Status   SARS Coronavirus 2 by RT PCR NEGATIVE NEGATIVE Final    Comment: (NOTE) SARS-CoV-2 target nucleic acids are NOT DETECTED.  The SARS-CoV-2 RNA is generally detectable in upper and lower respiratory specimens during the acute phase of infection. The lowest concentration of SARS-CoV-2 viral copies this assay can detect is 250 copies / mL. A negative  result does not preclude SARS-CoV-2 infection and should not be used as the sole basis for treatment or other patient management decisions.  A negative result may occur with improper specimen collection / handling, submission of specimen other than nasopharyngeal swab, presence of viral mutation(s) within the areas targeted by this assay, and inadequate number of viral copies (<250 copies / mL). A negative result must be combined with clinical observations, patient history, and epidemiological information.  Fact Sheet for Patients:   https://www.patel.info/  Fact Sheet for Healthcare Providers: https://hall.com/  This test is not yet approved or  cleared by the [place] FDA and has been authorized for detection and/or diagnosis of SARS-CoV-2 by FDA under an Emergency Use Authorization (EUA).  This EUA will remain in effect (meaning this test can be used) for the duration of the COVID-19 declaration under Section 564(b)(1) of the Act, 21 U.S.C. section 360bbb-3(b)(1), unless the authorization is terminated or revoked sooner.  Performed at [facility], [address], [place], [place] [address]     Radiology Studies: No results found.    Katasha Riga T. [place]  If 7PM-7AM, please contact night-coverage [email or web address] 06/03/2022, 4:28 PM

## 2022-06-03 NOTE — Care Management (Signed)
  Transition of Care [facility]) Screening Note   Patient Details  Name: Clinton Johnson Date of Birth: 1970/11/18   Transition of Care [facility]) CM/SW Contact:    Bethena Roys, RN Phone Number: 06/03/2022, 2:25 PM    Transition of Care Department [facility]) has reviewed the patient and no TOC needs have been identified at this time. Patient presented for chest pain and shortness of breath. Patient post LHC. Patient has Pensions consultant. We will continue to monitor patient advancement through interdisciplinary progression rounds. If new patient transition needs arise, please place a TOC consult.

## 2022-06-03 NOTE — Plan of Care (Signed)

## 2022-06-03 NOTE — Progress Notes (Signed)
   Heart Failure Stewardship Pharmacist Progress Note   PCP: Patient, No Pcp Per PCP-Cardiologist: Donato Heinz, MD    HPI:  52 yo M with PMH of CHF, CAD, HTN, HLD, and medication noncompliance.  Presented to the ED on 3/30 with chest pain, back pain, and shortness of breath. EKG reviewed with interventionalist, not STEMI. CXR with possible edema vs pneumonitis. ECHO 3/30 showed LVEF 30-35% (35-40% in 01/2021), regional wall motion abnormalities, RV normal. Taken for cath on 4/1 and found to have severe 2 vessel obstructive CAD - occluded ramus and 85% stenosis prox to mid RCA. S/p 2 DES. LVEDP 31.   Current HF Medications: Beta Blocker: metoprolol XL 50 mg daily ACE/ARB/ARNI: losartan 25 mg daily MRA: spironolactone 12.5 mg daily SGLT2i: Farxiga 10 mg daily  Prior to admission HF Medications: Diuretic: furosemide 40 mg daily ACE/ARB/ARNI: losartan 25 mg daily  Pertinent Lab Values: Serum creatinine 1.70, BUN 22, Potassium 3.5, Sodium 136, BNP 223.1, Magnesium 2.2, A1c 6.0   Vital Signs: Weight: 284 lbs (admission weight: 306 lbs) Blood pressure: 100-110/70s  Heart rate: 90s  I/O: -1.6L yesterday; net -6.2L  Medication Assistance / Insurance Benefits Check: Does the patient have prescription insurance?  Yes Type of insurance plan: [employer]  Outpatient Pharmacy:  Prior to [facility]: [facility] Is the patient willing to use [place] at discharge? Yes Is the patient willing to transition their outpatient pharmacy to utilize a [name] outpatient pharmacy?   Pending    Assessment: 1. Acute on chronic systolic CHF (LVEF 99991111), due to ICM. NYHA class III symptoms. - Off IV lasix. Strict I/Os and daily weights. Keep K>4 and Mg>2. - Agree with transitioning to metoprolol XL 50 mg daily - Continue losartan 25 mg daily - Continue spironolactone 12.5 mg daily - Continue Farxiga 10 mg daily   Plan: 1) Medication changes  recommended at this time: - Agree with changes  2) Patient assistance: - Patient has not paid insurance premium yet, unable to run test claims at this time.  - Encouraged patient to contact insurance before discharge. Patient support line [phone]. - Can use monthly copay cards for meds   3)  Education  - Patient has been educated on current HF medications and potential additions to HF medication regimen - Patient verbalizes understanding that over the next few months, these medication doses may change and more medications may be added to optimize HF regimen - Patient has been educated on basic disease state pathophysiology and goals of therapy   Kerby Nora, PharmD, BCPS Heart Failure Stewardship Pharmacist Phone [phone]

## 2022-06-03 NOTE — Progress Notes (Cosign Needed Addendum)
Rounding Note    Patient Name: Clinton Johnson Date of Encounter: 06/03/2022  [place] Cardiologist: Donato Heinz, MD   Subjective   Continues to complain of intermittent SHOB with brilitna administration. Notices it couple minutes after taking it. Does slowly resolve. He has been up and ambulating. Does well with that. No compaints.  Does report some yellow sputum with cough. Endorses some post nasal drip.    Inpatient Medications    Scheduled Meds:  aspirin EC  81 mg Oral Daily   atorvastatin  80 mg Oral Daily   azithromycin  500 mg Oral Daily   dapagliflozin propanediol  10 mg Oral Daily   docusate sodium  100 mg Oral BID   enoxaparin (LOVENOX) injection  40 mg Subcutaneous Q24H   losartan  25 mg Oral Daily   metoprolol succinate  50 mg Oral Daily   nicotine  14 mg Transdermal Daily   potassium chloride  20 mEq Oral QHS   [START ON 06/05/2022] prasugrel  10 mg Oral Daily   [START ON 06/04/2022] prasugrel  60 mg Oral Once   sodium chloride flush  3 mL Intravenous Q12H   sodium chloride flush  3 mL Intravenous Q12H   sodium chloride flush  3 mL Intravenous Q12H   spironolactone  12.5 mg Oral Daily   ticagrelor  90 mg Oral BID   Continuous Infusions:  sodium chloride     cefTRIAXone (ROCEPHIN)  IV Stopped (06/02/22 1813)   PRN Meds: sodium chloride, acetaminophen **OR** acetaminophen, bisacodyl, hydrALAZINE, morphine injection, ondansetron **OR** ondansetron (ZOFRAN) IV, mouth rinse, oxyCODONE-acetaminophen, polyethylene glycol, sodium chloride flush   Vital Signs    Vitals:   06/03/22 0610 06/03/22 0616 06/03/22 0756 06/03/22 0841  BP:   111/84 106/72  Pulse:   90 97  Resp: 17  18   Temp: 99 F (37.2 C)  98.3 F (36.8 C)   TempSrc: Oral  Oral   SpO2: 94%     Weight:  128.8 kg    Height:        Intake/Output Summary (Last 24 hours) at 06/03/2022 1432 Last data filed at 06/03/2022 0845 Gross per 24 hour  Intake 347.86 ml  Output 1300 ml   Net -952.14 ml      06/03/2022    6:16 AM 06/02/2022    4:34 AM 06/01/2022    5:09 AM  Last 3 Weights  Weight (lbs) 284 lb 290 lb 12.8 oz 294 lb 6.4 oz  Weight (kg) 128.822 kg 131.906 kg 133.539 kg      Telemetry    NSR - Personally Reviewed  ECG    EKG morphlogy similar to prior. Still has the ST depressions in precordial leads lateraly. Stable - Personally Reviewed  Physical Exam   GEN: No acute distress.  Sitting up bedside Neck: No JVD Cardiac: RRR, no murmurs, rubs, or gallops. No JVD, No LEE Respiratory: Clear to auscultation bilaterally. Breathing comfortably on RA.  GI: Soft, nontender, non-distended  MS: No edema; No deformity. Neuro:  Nonfocal, alert and oriented  Psych: Normal affect   Labs    High Sensitivity Troponin:   Recent Labs  Lab 05/31/22 0355 05/31/22 0551 05/31/22 1105 05/31/22 1741 06/01/22 0540  TROPONINIHS 316* 541* 3,215* 11,627* >24,000*     Chemistry Recent Labs  Lab 05/31/22 1105 06/01/22 0218 06/02/22 0155 06/03/22 0717  NA 140 136 137 136  K 3.5 3.8 3.6 3.5  CL 102 102 101 102  CO2 24 22  24 21*  GLUCOSE 110* 126* 115* 113*  BUN 11 11 13  22*  CREATININE 1.18 1.32* 1.40* 1.70*  CALCIUM 9.2 9.3 9.0 9.1  MG 2.2  --  2.2  --   PROT  --   --  8.1  --   ALBUMIN  --   --  3.4*  3.5  --   AST  --   --  133*  --   ALT  --   --  33  --   ALKPHOS  --   --  73  --   BILITOT  --   --  1.6*  --   GFRNONAA >60 >60 60* 47*  ANIONGAP 14 12 12 13     Lipids  Recent Labs  Lab 05/31/22 1105  CHOL 264*  TRIG 163*  HDL 43  LDLCALC 188*  CHOLHDL 6.1    Hematology Recent Labs  Lab 06/01/22 0218 06/02/22 0155 06/03/22 0717  WBC 10.2 13.4* 12.3*  RBC 5.43 5.41 5.24  HGB 15.2 14.5 14.8  HCT 45.3 45.3 43.5  MCV 83.4 83.7 83.0  MCH 28.0 26.8 28.2  MCHC 33.6 32.0 34.0  RDW 16.4* 16.1* 16.1*  PLT 256 247 249   Thyroid  Recent Labs  Lab 05/31/22 1105  TSH 4.506*    BNP Recent Labs  Lab 05/31/22 0355  BNP 223.1*     DDimer No results for input(s): "DDIMER" in the last 168 hours.   Radiology    No new radiology studies  Cardiac Studies   05/31/22 ECHO: (Personally reviewed): EF 30 to 35% " Mid to distal anteroseptal, anterolateral, anterior, inferoseptal walls are akinetic. Entire apex is akinetic.  Indeterminate diastolic parameters-LA is mildly dilated..  Normal RV size and function but unable to assess PAP.  Normal RAP.  Normal Mitral and Aortic Valves.    06/02/22 Cardiac Cath-PCI : Severe 2 vessel obstructive CAD  Culprit Lesion #1: Ramus lesion is 100% stenosed ->   A drug-eluting stent was successfully placed (with IVUS guidance) using a SYNERGY XD 3.50X32 - postdilated to 3.8 mm,  Post intervention, there is a 0% residual stenosis;    Lesion #2: Prox RCA to Mid RCA lesion is 85% stenosed ->   A drug-eluting stent was successfully placed (with IVUS guidance) using a SYNERGY XD 4.0X20,   Post intervention, there is a 0% residual stenosis;   Prox RCA lesion is 40% stenosed.   Previously placed Prox LAD to Mid LAD Stent is widely patent.   LV end diastolic pressure is moderately elevated. Diagnostic: Dominance: Right      Intervention  Patient Profile     52 y.o. male with a hx of CAD and prior stent to LAD, CHF with EF 35-40%, HLD, tobacco use and HTN who is being seen 05/31/2022 for the evaluation of chest pain and CHF at the request of Dr. Sidney Ace.   Assessment & Plan    Principal Problem:   NSTEMI (non-ST elevated myocardial infarction) Active Problems:   Tobacco use   Essential hypertension   Hypokalemia   Acute on chronic combined systolic and diastolic CHF (congestive heart failure)   Hyperlipidemia with target LDL less than 70   At risk for sleep apnea   Coronary artery disease involving native coronary artery of native heart with unstable angina pectoris   Morbid (severe) obesity due to excess calories   Stented coronary artery   NSVT (nonsustained ventricular tachycardia)   ACS (acute  coronary syndrome)  # NSTEMI / #  3 V CAD with UA - s/p 2 V PCI with Patent LAD stent Underwent PCI with DES placement to Ramus lesion and prox-mid RCA yesterday. He will need to be on DAPT for at least one year. Experiencing dyspnea with Brilinta administration. We will complete his second dose this evening with plan to switch to prasugrel in the AM due to intolerable medication side effect. He will continue on aspirin.   - Aspirin 81 - Switch brilinta to prasugrel tomorrow.  - continue DAPT 1 year.  - Atorvastatin 80mg  - Toprol 50mg  BID  # ICM/ HFrEF 30-35% -> Acute on Chronic Combined Systolic and Diastolic Heart Failure Mid to distal anteroseptal, anterolateral, anterior, inferoseptal walls, and apex are akinetic. Apical akinesis likely related to prior infarct. Mild reduction in overall EF. Anterolateral wall related to occluded ramus which should improve along with inferior/inferoseptal wall.  He received lasix 40mg  IV BID yesterday given his elevated LVEDP in cath lab and reported orthopnea. Had good UOP with decrease in weight and resolution of crackles. He now appears euvolemic, but with worsening renal function. We will continue his losartan, spiro, and Farxiga at current doses, but will hold off Lasix today. Can resume oral lasix tomorrow. Was on Lasix 40mg  daily PTA. He may be able to discharge onthis dose with instructions for sliding scale.  He continues to report shortness of breath despite euvolemia suggesting Brilinta side effect as etiology.  Patient has not paid premium, he is not able to receive Entresto without significant cost. Will continue other GDMT as able. - hold lasix today, plan to resume PO formulation tomorrow.  - Losartan 25mg  daily - toprol 50mg  daily - spiro 12.5mg  daily  - Farxiga 10mg  daily - cardiac rehab  # HTN Normotensive.  - optimizing GDMT as BP tolerates.  # HLD On high intensity atorvastatin  # Tobacco use Smoking cessation  # NSVT ->  suspect this is related to ischemia Resolved  # Morbid obesity/at risk for OSA.   Dietary modification recommended.  Cardiac rehab consulted for CAD/HFrEF-needs dietary counseling.  # CAP - per primary  For questions or updates, please contact [place] Please consult [email or web address] for contact info under   Signed, Delene Ruffini, MD  06/02/2022, 10:37 AM

## 2022-06-03 NOTE — Progress Notes (Signed)
CARDIAC REHAB PHASE I   PRE:  Rate/Rhythm:  90 NSR  BP:  Sitting: 106/72      SaO2: 94 RA  MODE:  Ambulation: 440 ft   AD:  None  POST:  Rate/Rhythm: 107 ST  BP:  Sitting: 112/87      SaO2: 94 RA  Pt amb with standby assistance, pt denies CP and SOB during amb and was returned to room w/o complaint.   Pt walked well.  Pt was educated on stent card, stent location, Birlinta and ASA use, wt restrictions, no baths/daily wash-ups, s/s of infection, ex guidelines (progressive walking and resistance exercise), s/s to stop exercising, NTG use and calling 911, heart healthy diet, risk factors (smoking, High LDLs), and CRPII. Pt received MI book and materials on exercise and diet. Will refer to [place] 9:33 AM 06/03/2022    Service time is from 0840 to 0930.

## 2022-06-03 NOTE — Progress Notes (Signed)
Heart Failure Nurse Navigator Progress Note  PCP: Patient, No Pcp Per PCP-Cardiologist: [facility] Admission Diagnosis: None Admitted from: Home  Presentation:   Clinton Johnson presented with after waking with back and chest pain, shortness of breath, BP 145/109, HR 104, mild trace bil lateral lowe extremity edema. BNP 223, Troponin >24,000. IV lasix given, CXR with possible edema vs pneumonitis, ECHO 30-35%, CATH 4/1 found to have 2 vessel obstructive CAD, S/p 2 DES.   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   ECHO/ LVEF: 30-35%    Clinical Course:  Past Medical History:  Diagnosis Date   Chronic systolic (congestive) heart failure    Coronary artery disease    Hypertension      Social History   Socioeconomic History   Marital status: Single    Spouse name: Not on file   Number of children: 2   Years of education: Not on file   Highest education level: Not on file  Occupational History   Occupation: Truck driver  Tobacco Use   Smoking status: Every Day    Packs/day: 1    Types: Cigarettes   Smokeless tobacco: Never  Vaping Use   Vaping Use: Never used  Substance and Sexual Activity   Alcohol use: Yes    Comment: rare   Drug use: Never   Sexual activity: Not on file  Other Topics Concern   Not on file  Social History Narrative   Not on file   Social Determinants of Health   Financial Resource Strain: Low Risk  (06/03/2022)   Overall Financial Resource Strain (CARDIA)    Difficulty of Paying Living Expenses: Not very hard  Food Insecurity: No Food Insecurity (05/31/2022)   Hunger Vital Sign    Worried About Running Out of  Food in the Last Year: Never true    Ran Out of Food in the Last Year: Never true  Transportation Needs: No Transportation Needs (05/31/2022)   PRAPARE - Hydrologist (Medical): No    Lack of Transportation (Non-Medical): No  Physical Activity: Not on file  Stress: Not on file  Social Connections: Not on file   Education Assessment and Provision:  Detailed education and instructions provided on heart failure disease management including the following:  Signs and symptoms of Heart Failure When to call the physician Importance of daily weights Low sodium diet Fluid restriction Medication management Anticipated future follow-up appointments  Patient education given on each of the above topics.  Patient acknowledges understanding via teach back method and acceptance of all instructions.  Education Materials:  "Living Better With Heart Failure" Booklet, HF zone tool, & Daily Weight Tracker Tool.  Patient has scale at home: No, stated he will buy one upon discharge.  Patient has pill box at home: NA    High Risk Criteria for Readmission and/or Poor Patient Outcomes: Heart failure hospital admissions (last 6 months): 1  No Show rate: 6 %  Difficult social situation: No Demonstrates medication adherence: No Primary Language: English Literacy level: Reading, writing, and comprehension  Barriers of Care:   Diet/ fluid/ daily weights Medication compliance  Considerations/Referrals:   Referral made to Heart  Failure Pharmacist Stewardship: Yes Referral made to Heart Failure CSW/NCM TOC: No Referral made to Heart & Vascular TOC clinic: Yes, 06/23/2022 @ 9 am  Items for Follow-up on DC/TOC: Medication compliance Diet/ fluid/ daily weights   Earnestine Leys, BSN, RN Heart Failure Transport planner Only

## 2022-06-04 ENCOUNTER — Other Ambulatory Visit (HOSPITAL_COMMUNITY): Payer: Self-pay

## 2022-06-04 DIAGNOSIS — Z955 Presence of coronary angioplasty implant and graft: Secondary | ICD-10-CM | POA: Insufficient documentation

## 2022-06-04 LAB — CBC
HCT: 41.2 % (ref 39.0–52.0)
Hemoglobin: 13.8 g/dL (ref 13.0–17.0)
MCH: 27.7 pg (ref 26.0–34.0)
MCHC: 33.5 g/dL (ref 30.0–36.0)
MCV: 82.6 fL (ref 80.0–100.0)
Platelets: 249 10*3/uL (ref 150–400)
RBC: 4.99 MIL/uL (ref 4.22–5.81)
RDW: 16 % — ABNORMAL HIGH (ref 11.5–15.5)
WBC: 9.6 10*3/uL (ref 4.0–10.5)
nRBC: 0 % (ref 0.0–0.2)

## 2022-06-04 LAB — BASIC METABOLIC PANEL
Anion gap: 13 (ref 5–15)
BUN: 28 mg/dL — ABNORMAL HIGH (ref 6–20)
CO2: 20 mmol/L — ABNORMAL LOW (ref 22–32)
Calcium: 9.1 mg/dL (ref 8.9–10.3)
Chloride: 99 mmol/L (ref 98–111)
Creatinine, Ser: 1.54 mg/dL — ABNORMAL HIGH (ref 0.61–1.24)
GFR, Estimated: 53 mL/min — ABNORMAL LOW (ref >60)
Glucose, Bld: 100 mg/dL — ABNORMAL HIGH (ref 70–99)
Potassium: 3.3 mmol/L — ABNORMAL LOW (ref 3.5–5.1)
Sodium: 132 mmol/L — ABNORMAL LOW (ref 135–145)

## 2022-06-04 LAB — LIPOPROTEIN A (LPA): Lipoprotein (a): 161.7 nmol/L — ABNORMAL HIGH (ref <75.0)

## 2022-06-04 LAB — BRAIN NATRIURETIC PEPTIDE: B Natriuretic Peptide: 135.3 pg/mL — ABNORMAL HIGH (ref 0.0–100.0)

## 2022-06-04 LAB — MAGNESIUM: Magnesium: 2.6 mg/dL — ABNORMAL HIGH (ref 1.7–2.4)

## 2022-06-04 MED ORDER — ATORVASTATIN CALCIUM 80 MG PO TABS
80.0000 mg | ORAL_TABLET | Freq: Every day | ORAL | 0 refills | Status: AC
  Filled 2022-06-04: qty 30, 30d supply, fill #0

## 2022-06-04 MED ORDER — METOPROLOL SUCCINATE ER 50 MG PO TB24
50.0000 mg | ORAL_TABLET | Freq: Every day | ORAL | 0 refills | Status: AC
  Filled 2022-06-04: qty 30, 30d supply, fill #0

## 2022-06-04 MED ORDER — PRASUGREL HCL 10 MG PO TABS
10.0000 mg | ORAL_TABLET | Freq: Every day | ORAL | 0 refills | Status: AC
  Filled 2022-06-04: qty 30, 30d supply, fill #0

## 2022-06-04 MED ORDER — NITROGLYCERIN 0.4 MG SL SUBL
0.4000 mg | SUBLINGUAL_TABLET | SUBLINGUAL | 0 refills | Status: AC | PRN
  Filled 2022-06-04: qty 25, 5d supply, fill #0

## 2022-06-04 MED ORDER — POTASSIUM CHLORIDE CRYS ER 20 MEQ PO TBCR
40.0000 meq | EXTENDED_RELEASE_TABLET | Freq: Once | ORAL | Status: AC
  Administered 2022-06-04: 40 meq via ORAL
  Filled 2022-06-04: qty 2

## 2022-06-04 MED ORDER — SPIRONOLACTONE 25 MG PO TABS
12.5000 mg | ORAL_TABLET | Freq: Every day | ORAL | 0 refills | Status: AC
  Filled 2022-06-04: qty 15, 30d supply, fill #0

## 2022-06-04 MED ORDER — DAPAGLIFLOZIN PROPANEDIOL 10 MG PO TABS
10.0000 mg | ORAL_TABLET | Freq: Every day | ORAL | 0 refills | Status: AC
  Filled 2022-06-04: qty 30, 30d supply, fill #0

## 2022-06-04 NOTE — Discharge Instructions (Signed)
Information about your medication: Effient (anti-platelet agent)  Generic Name (Brand): prasugrel (Effient), once daily medication  PURPOSE: You are taking this medication along with aspirin to lower your chance of having a heart attack, stroke, or blood clots in your heart stent. These can be fatal. Effient and aspirin help prevent platelets from sticking together and forming a clot that can block an artery or your stent.   Common SIDE EFFECTS you may experience include: bruising or bleeding more easily, shortness of breath  Do not stop taking EFFIENT without talking to the doctor who prescribes it for you. People who are treated with a stent and stop taking Effient too soon, have a higher risk of getting a blood clot in the stent, having a heart attack, or dying. If you stop Effient because of bleeding, or for other reasons, your risk of a heart attack or stroke may increase.   Avoid taking NSAID agents or anti-inflammatory medications such as ibuprofen, naproxen given increased bleed risk with plavix - can use acetaminophen (Tylenol) if needed for pain.  Tell all of your doctors and dentists that you are taking Effient. They should talk to the doctor who prescribed Effient for you before you have any surgery or invasive procedure.   Contact your health care provider if you experience: severe or uncontrollable bleeding, pink/red/brown urine, vomiting blood or vomit that looks like "coffee grounds", red or black stools (looks like tar), coughing up blood or blood clots ----------------------------------------------------------------------------------------------------------------------   

## 2022-06-04 NOTE — Assessment & Plan Note (Signed)
Smoking cessation and alcohol cessation counseling.

## 2022-06-04 NOTE — Assessment & Plan Note (Addendum)
Echocardiogram with LV systolic function 30 to [id number], mid to distal antero septal, antero lateral, anterior, infero septal wall akinetic. Entire apex akinetic. RV with normal systolic function. No significant valvular disease.   Patient was placed on furosemide for diuresis with improvement in volume status and symptoms. At the time of his discharge his volume status is negative -6,421 ml.  Plan to continue with losartan, spironolactone, metoprolol succinate and SGLT 2 inh.   Acute hypoxemic respiratory failure due to cardiogenic pulmonary edema. Oxygenation has improved with diuresis, at the time of his discharge his 02 saturation is 93% on room air.   Patient was placed on antibiotic therapy for pneumonia, completed therapy during his hospitalization.

## 2022-06-04 NOTE — Assessment & Plan Note (Signed)
Patient was placed on heparin and nitroglycerin.   04/01 cardiac catheterization with severe 2 vessel obstructive CAD. Compared to last cath in Nov 2022, ramus intermediate was occluded with collaterals, lesion in the mid RCA had progressed with ulcerative plaque. The LAD widely patent. Patient had successful PCI of the ramus intermedius with DES x1 and mid RCA DES x1.   Patient had no further chest pain, recommended to continue dual antiplatelet therapy with aspirin and prasugrel for 12 months.  Patient did not tolerated well ticagrelor due to dyspnea.   Continue metoprolol, statin, metoprolol and losartan.

## 2022-06-04 NOTE — Assessment & Plan Note (Addendum)
Hypertensive emergency.  Continue blood pressure control with metoprolol. Losartan and spironolactone. Will need close follow up as outpatient.

## 2022-06-04 NOTE — Progress Notes (Addendum)
   Heart Failure Stewardship Pharmacist Progress Note   PCP: Patient, No Pcp Per PCP-Cardiologist: Donato Heinz, MD    HPI:  52 yo M with PMH of CHF, CAD, HTN, HLD, and medication noncompliance.  Presented to the ED on 3/30 with chest pain, back pain, and shortness of breath. EKG reviewed with interventionalist, not STEMI. CXR with possible edema vs pneumonitis. ECHO 3/30 showed LVEF 30-35% (35-40% in 01/2021), regional wall motion abnormalities, RV normal. Taken for cath on 4/1 and found to have severe 2 vessel obstructive CAD - occluded ramus and 85% stenosis prox to mid RCA. S/p 2 DES. LVEDP 31.   Current HF Medications: Beta Blocker: metoprolol XL 50 mg daily ACE/ARB/ARNI: losartan 25 mg daily MRA: spironolactone 12.5 mg daily SGLT2i: Farxiga 10 mg daily  Prior to admission HF Medications: Diuretic: furosemide 40 mg daily ACE/ARB/ARNI: losartan 25 mg daily  Pertinent Lab Values: Serum creatinine 1.54, BUN 28, Potassium 3.3, Sodium 132, BNP 223.1, Magnesium 2.6, A1c 6.0   Vital Signs: Weight: 287 lbs (admission weight: 306 lbs) Blood pressure: 100/70s  Heart rate: 80s I/O: net -6.4L  Medication Assistance / Insurance Benefits Check: Does the patient have prescription insurance?  Yes Type of insurance plan: [employer]  Outpatient Pharmacy:  Prior to admission outpatient pharmacy: [facility] Is the patient willing to use [facility] at discharge? Yes Is the patient willing to transition their outpatient pharmacy to utilize a [facility] outpatient pharmacy?   No    Assessment: 1. Acute on chronic systolic CHF (LVEF [id number]), due to ICM. NYHA class III symptoms. - Off IV lasix. Strict I/Os and daily weights. Keep K>4 and Mg>2. KCl 20 mEq daily ordered with additional 40 mEq today. - Continue metoprolol XL 50 mg daily - Continue losartan 25 mg daily - Continue spironolactone 12.5 mg daily, consider increasing to 25 mg daily to assist with  hyperkalemia and compliance by not having to split tablets at home. - Continue Farxiga 10 mg daily   Plan: 1) Medication changes recommended at this time: - Increase spironolactone to target 25 mg daily  2) Patient assistance: - Patient has not paid insurance premium yet, unable to run test claims at this time.  - Patient is attempting to pay premium this morning, he states it is $128 and is affordable for him - Can use additional monthly copay cards for meds   3)  Education  - Patient has been educated on current HF medications and potential additions to HF medication regimen - Patient verbalizes understanding that over the next few months, these medication doses may change and more medications may be added to optimize HF regimen - Patient has been educated on basic disease state pathophysiology and goals of therapy - Discussed importance of medication adherence at length. Patient verbalized many concerns with taking new medications. Agreeable to taking aspirin, prasugrel, and losartan. Discussed benefits vs risks and the purpose behind each medication that he was unwilling at first to take. Unclear if patient will be adherent to medications after discharge.    Kerby Nora, PharmD, BCPS Heart Failure Stewardship Pharmacist Phone [phone]

## 2022-06-04 NOTE — Hospital Course (Signed)
Clinton Johnson was admitted to the hospital with the working diagnosis of NSTEMI.   52 yo male with the past medical history of coronary artery disease, hypertension, heart failure and obesity who presented with dyspnea and chest pain. Apparently he has not being compliant with his medications at home. He developed substernal chest pain, right shoulder pain with radiation to the substernal region and back, associated with dyspnea prompting him to come to the hospital. On his initial physical examination his blood pressure was 149/109, HR 110, RR 31 and 02 saturation 81- 86%, heart with S1 and S2 present and rhythmic, lungs with diffuse eales but with no wheezing, abdomen with no distention and no lower extremity edema.   Na 137, K 3,1 Cl 103 bicarbonate 25, glucose 127, bun 15 cr 1,37  BNP 223,  High sensitive troponin 316, 541, 3,215  Wbc 7.6 hgb 15.2 plt 267  Sars covid 19 negative   Chest radiograph with cardiomegaly, bilateral diffuse interstitial infiltrates, more dense at the lower lobes, no air bronchogram.   EKG 102 bpm, normal axis, qtc 508, sinus rhythm with ST depression V3 to V5, no significant T wave changes.   Patient was placed on nitropaste and IV heparin.  Procalcitonin with mild elevation and placed on antibiotic therapy for community acquired pneumonia.  Echocardiogram with reduced LV systolic function and wall motion abnormalities.  Underwent cardiac catheterization and PCI x2.

## 2022-06-04 NOTE — Assessment & Plan Note (Signed)
Hyponatremia and hypokalemia.   Renal function with serum cr at 1,54 with K at 3,3 and serum bicarbonate at 20. Plan to continue with spironolactone and SGLT 2 inh Had Kcl po prior to dc 40 meq.

## 2022-06-04 NOTE — Discharge Summary (Addendum)
Physician Discharge Summary   Patient: Clinton Johnson MRN: [id number] DOB: 1970/08/19  Admit date:     05/31/2022  Discharge date: 06/04/22  Discharge Physician: Jimmy Picket Clinton Johnson   PCP: Patient, No Pcp Per   Recommendations at discharge:    Patient has been placed on aspirin and prasugrel for at least 12 months. Started on SGLT 2 ing and spironolactone On hold furosemide for now. Blood pressure control with metoprolol succinate and losartan, possible transition to entresto as outpatient.  Follow up renal function in 7 days as outpatient. Follow up with primary care in 7 to 10 days. Follow up with Cardiology as scheduled.   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 [facility] Course: Mr. Tashjian was admitted to the hospital with the working diagnosis of NSTEMI.   52 yo male with the past medical history of coronary artery disease, hypertension, heart failure and obesity who presented with dyspnea and chest pain. Apparently he has not being compliant with his medications at home. He developed substernal chest pain, right shoulder pain with radiation to the substernal region and back, associated with dyspnea prompting him to come to the hospital. On his initial physical examination his blood pressure was 149/109, HR 110, RR 31 and 02 saturation 81- 86%, heart with S1 and S2 present and rhythmic, lungs with diffuse eales but with no wheezing, abdomen with no distention and no lower extremity edema.   Na 137, K 3,1 Cl 103 bicarbonate 25, glucose 127, bun 15 cr 1,37  BNP 223,  High sensitive troponin 316, 541, 3,215  Wbc 7.6 hgb 15.2 plt 267  Sars covid 19 negative   Chest radiograph with  cardiomegaly, bilateral diffuse interstitial infiltrates, more dense at the lower lobes, no air bronchogram.   EKG 102 bpm, normal axis, qtc 508, sinus rhythm with ST depression V3 to V5, no significant T wave changes.   Patient was placed on nitropaste and IV heparin.  Procalcitonin with mild elevation and placed on antibiotic therapy for community acquired pneumonia.  Echocardiogram with reduced LV systolic function and wall motion abnormalities.  Underwent cardiac catheterization and PCI x2.        Assessment and Plan: * NSTEMI (non-ST elevated myocardial infarction) Patient was placed on heparin and nitroglycerin.   04/01 cardiac catheterization with severe 2 vessel obstructive CAD. Compared to last cath in Nov 2022, ramus intermediate was occluded with collaterals, lesion in the mid RCA had progressed with ulcerative plaque. The LAD widely patent. Patient had successful PCI of the ramus intermedius with DES x1 and mid RCA DES x1.   Patient had no further chest pain, recommended to continue dual antiplatelet therapy with aspirin and prasugrel for 12 months.  Patient did not tolerated well ticagrelor due to dyspnea.   Continue metoprolol, statin, metoprolol and losartan.    Acute on chronic systolic CHF (congestive heart failure) Echocardiogram with LV systolic function 30 to [id number], mid to distal antero septal, antero lateral, anterior, infero septal wall akinetic. Entire apex akinetic. RV with normal systolic function. No significant valvular disease.   Patient was placed on furosemide for diuresis with improvement in volume status and symptoms. At the time of his discharge his volume status is negative [phone]  ml.  Plan to continue with losartan, spironolactone, metoprolol succinate and SGLT 2 inh.   Acute hypoxemic respiratory failure due to cardiogenic pulmonary edema. Oxygenation has improved with diuresis, at the time of his discharge his 02 saturation is 93% on room air.    Patient was placed on antibiotic therapy for pneumonia, completed therapy during his hospitalization.   Essential hypertension Hypertensive emergency.  Continue blood pressure control with metoprolol. Losartan and spironolactone. Will need close follow up as outpatient.   AKI (acute kidney injury) Hyponatremia and hypokalemia.   Renal function with serum cr at 1,54 with K at 3,3 and serum bicarbonate at 20. Plan to continue with spironolactone and SGLT 2 inh Had Kcl po prior to dc 40 meq.   Tobacco use Smoking cessation and alcohol cessation counseling.   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 Calculated BMI 36,8          Consultants: cardiology  Procedures performed: cardiac catheterization   Disposition: Home Diet recommendation:  Discharge Diet Orders (From admission, onward)     Start     Ordered   06/04/22 0000  Diet - low sodium heart healthy        06/04/22 1203           Cardiac diet DISCHARGE MEDICATION: Allergies as of 06/04/2022   No Known Allergies      Medication List     STOP taking these medications    furosemide 40 MG tablet Commonly known as: LASIX   lidocaine 5 % Commonly known as: Lidoderm   methocarbamol 500 MG tablet Commonly known as: ROBAXIN   metoprolol tartrate 25 MG tablet Commonly known as: LOPRESSOR   nicotine 14 mg/24hr patch Commonly known as: NICODERM CQ - dosed in mg/24 hours       TAKE these medications    aspirin EC 81 MG tablet Take 81 mg by mouth daily.   atorvastatin 80 MG tablet Commonly known as: LIPITOR Take 1 tablet (80 mg total) by mouth daily.   dapagliflozin propanediol 10 MG Tabs tablet Commonly known as: FARXIGA Take 1 tablet (10 mg total) by mouth daily. Start taking on: June 05, 2022   losartan 25 MG tablet Commonly known as: COZAAR Take 1 tablet (25 mg total) by mouth daily.   metoprolol succinate 50 MG 24 hr tablet Commonly known as: TOPROL-XL Take 1 tablet (50 mg total) by  mouth daily. Take with or immediately following a meal. Start taking on: June 05, 2022   nitroGLYCERIN 0.4 MG SL tablet Commonly known as: NITROSTAT Place 1 tablet (0.4 mg total) under the tongue every 5 (five) minutes x 3 doses as needed for chest pain.   prasugrel 10 MG Tabs tablet Commonly known as: EFFIENT Take 1 tablet (10 mg total) by mouth daily. Start taking on: June 05, 2022   spironolactone 25 MG tablet Commonly known as: ALDACTONE Take 0.5 tablets (12.5 mg total) by mouth daily. Start taking on: June 05, 2022        Follow-up Information     [facility]. Go in 20 day(s).   Specialty: Cardiology Why: Hospital follow up 06/23/2022 @ 9 am PLEASE bring a current medication list to appointment FREE valet parking, Entrance C, off [employer] information: [id number] [place] [phone]               Discharge Exam: [place] [employer]   06/02/22 [phone] 06/03/22 [phone] 06/04/22 [phone]  Weight: 131.9 kg 128.8 kg 130.3 kg   BP 100/72   Pulse 90   Temp 98.8 F (37.1 C) (Oral)   Resp 19   Ht 6\' 2"  (1.88 m)   Wt 130.3 kg   SpO2 93%   BMI 36.87 kg/m   Patient is feeling well, no dyspnea or chest pain.  Neurology awake and alert ENT with no pallor Cardiovascular with S1 and S2 present and rhythmic with no gallops, rubs or murmurs Respiratory with no rales or rhonchi, no wheezing Abdomen with no distention  No lower extremity edema   Condition at discharge: stable  The results of significant diagnostics from this hospitalization (including imaging, microbiology, ancillary and laboratory) are listed below for reference.   Imaging Studies: CARDIAC CATHETERIZATION  Result Date: 06/02/2022   Ramus lesion is 100% stenosed.   Prox RCA to Mid RCA lesion is 85% stenosed.   Prox RCA lesion is 40% stenosed.   Non-stenotic Prox LAD to Mid LAD lesion was previously  treated.   A drug-eluting stent was successfully placed using a SYNERGY XD 3.50X32.   A drug-eluting stent was successfully placed using a SYNERGY XD 4.0X20.   Post intervention, there is a 0% residual stenosis.   Post intervention, there is a 0% residual stenosis.   LV end diastolic pressure is moderately elevated. Severe 2 vessel obstructive CAD. Compared to last Cath in November 2022 ramus intermediate is now occluded with collaterals. Lesion in the mid RCA has progressed with ulcerative plaque. The LAD is widely patent. Moderately elevated LVEDP 31 mm Hg Successful PCI of the ramus intermediate with DES x 1 and IVUS guidance Successful PCI of the mid RCA with DES x 1 and IVUS guidance Plan: optimize medical therapy for CHF. DAPT for at least one year.   ECHOCARDIOGRAM COMPLETE  Result Date: 05/31/2022    ECHOCARDIOGRAM REPORT   Patient Name:   Clinton Johnson Date of Exam: 05/31/2022 Medical Rec #:  [id number]     Height:       74.0 in Accession #:    [id number]    Weight:       306.1 lb Date of Birth:  03/06/1970      BSA:          2.604 m Patient Age:    4 years      BP:           121/100 mmHg Patient Gender: M             HR:           93 bpm. Exam Location:  Inpatient Procedure: 2D Echo, Cardiac Doppler and Color Doppler Indications:    Chest Pain R07.09  History:        Patient has prior history of Echocardiogram examinations, most                 recent 01/28/2021. CHF, Previous Myocardial Infarction; Risk                 Factors:Hypertension, Dyslipidemia and Current Smoker.  Sonographer:    Ronny Flurry Referring Phys: [id number] JENNIFER YATES  Sonographer Comments: Image acquisition challenging due to uncooperative patient. IMPRESSIONS  1. Left ventricular ejection fraction, by estimation, is 30 to 35%. The left ventricle has moderately decreased function. The left ventricle demonstrates regional wall motion abnormalities (see scoring diagram/findings for description). Left ventricular  diastolic  parameters are indeterminate.  2. Right ventricular systolic function is normal. The right ventricular size is normal.  Tricuspid regurgitation signal is inadequate for assessing PA pressure.  3. Left atrial size was mildly dilated.  4. The mitral valve is abnormal. Mild mitral valve regurgitation. No evidence of mitral stenosis.  5. The tricuspid valve is abnormal.  6. The aortic valve has an indeterminant number of cusps. Aortic valve regurgitation is not visualized. No aortic stenosis is present.  7. Aortic dilatation noted. There is mild dilatation of the ascending aorta, measuring 37 mm.  8. The inferior vena cava is normal in size with greater than 50% respiratory variability, suggesting right atrial pressure of 3 mmHg. FINDINGS  Left Ventricle: Mid to distal anteroseptal, anterolateral, anterior, inferoseptal walls are akinetic. Entire apex is akinetic. Left ventricular ejection fraction, by estimation, is 30 to 35%. The left ventricle has moderately decreased function. The left ventricle demonstrates regional wall motion abnormalities. The left ventricular internal cavity size was normal in size. There is no left ventricular hypertrophy. Left ventricular diastolic parameters are indeterminate. Right Ventricle: The right ventricular size is normal. Right vetricular wall thickness was not well visualized. Right ventricular systolic function is normal. Tricuspid regurgitation signal is inadequate for assessing PA pressure. Left Atrium: Left atrial size was mildly dilated. Right Atrium: Right atrial size was normal in size. Pericardium: There is no evidence of pericardial effusion. Mitral Valve: The mitral valve is abnormal. Mild mitral valve regurgitation. No evidence of mitral valve stenosis. Tricuspid Valve: The tricuspid valve is abnormal. Tricuspid valve regurgitation is mild . No evidence of tricuspid stenosis. Aortic Valve: The aortic valve has an indeterminant number of cusps. Aortic valve regurgitation  is not visualized. No aortic stenosis is present. Aortic valve mean gradient measures 1.0 mmHg. Aortic valve peak gradient measures 2.5 mmHg. Aortic valve area, by VTI measures 3.18 cm. Pulmonic Valve: The pulmonic valve was not well visualized. Pulmonic valve regurgitation is mild. No evidence of pulmonic stenosis. Aorta: The aortic root is normal in size and structure and aortic dilatation noted. There is mild dilatation of the ascending aorta, measuring 37 mm. Venous: The inferior vena cava is normal in size with greater than 50% respiratory variability, suggesting right atrial pressure of 3 mmHg. IAS/Shunts: No atrial level shunt detected by color flow Doppler.  LEFT VENTRICLE PLAX 2D LVIDd:         5.60 cm   Diastology LVIDs:         4.50 cm   LV e' lateral:   8.10 cm/s LV PW:         1.00 cm   LV E/e' lateral: 8.6 LV IVS:        1.00 cm LVOT diam:     2.50 cm LV SV:         42 LV SV Index:   16 LVOT Area:     4.91 cm  RIGHT VENTRICLE             IVC RV S prime:     10.30 cm/s  IVC diam: 2.00 cm TAPSE (M-mode): 1.8 cm LEFT ATRIUM              Index        RIGHT ATRIUM           Index LA diam:        4.70 cm  1.80 cm/m   RA Area:     19.20 cm LA Vol (A2C):   87.8 ml  33.71 ml/m  RA Volume:   55.70 ml  21.39 ml/m LA Vol (A4C):   108.0 ml  41.47 ml/m LA Biplane Vol: 97.7 ml  37.51 ml/m  AORTIC VALVE AV Area (Vmax):    3.21 cm AV Area (Vmean):   2.82 cm AV Area (VTI):     3.18 cm AV Vmax:           79.50 cm/s AV Vmean:          56.000 cm/s AV VTI:            0.133 m AV Peak Grad:      2.5 mmHg AV Mean Grad:      1.0 mmHg LVOT Vmax:         51.96 cm/s LVOT Vmean:        32.120 cm/s LVOT VTI:          0.086 m LVOT/AV VTI ratio: 0.65  AORTA Ao Root diam: 3.50 cm Ao Asc diam:  3.70 cm MITRAL VALVE MV Area (PHT): 5.97 cm    SHUNTS MV Decel Time: 127 msec    Systemic VTI:  0.09 m MV E velocity: 70.00 cm/s  Systemic Diam: 2.50 cm MV A velocity: 44.30 cm/s MV E/A ratio:  1.58 Carlyle Dolly MD Electronically  signed by Carlyle Dolly MD Signature Date/Time: 05/31/2022/3:38:47 PM    Final    DG Chest Port 1 View  Result Date: 05/31/2022 CLINICAL DATA:  Shortness of breath. EXAM: PORTABLE CHEST 1 VIEW COMPARISON:  01/26/2021. FINDINGS: The heart size and mediastinal contours are within normal limits. Diffuse hazy airspace opacities are noted in the lungs bilaterally with patchy airspace disease at the lung bases. No effusion or pneumothorax. No acute osseous abnormality. IMPRESSION: Diffuse hazy opacities in the lungs bilaterally with patchy airspace disease at the lung bases, possible edema versus pneumonitis. Electronically Signed   By: Brett Fairy M.D.   On: 05/31/2022 04:19    Microbiology: Results for orders placed or performed during the hospital encounter of 05/31/22  SARS Coronavirus 2 by RT PCR (hospital order, performed in [facility] hospital lab) *cepheid single result test* Anterior Nasal Swab     Status: None   Collection Time: 05/31/22  3:58 AM   Specimen: Anterior Nasal Swab  Result Value Ref Range Status   SARS Coronavirus 2 by RT PCR NEGATIVE NEGATIVE Final    Comment: (NOTE) SARS-CoV-2 target nucleic acids are NOT DETECTED.  The SARS-CoV-2 RNA is generally detectable in upper and lower respiratory specimens during the acute phase of infection. The lowest concentration of SARS-CoV-2 viral copies this assay can detect is 250 copies / mL. A negative result does not preclude SARS-CoV-2 infection and should not be used as the sole basis for treatment or other patient management decisions.  A negative result may occur with improper specimen collection / handling, submission of specimen other than nasopharyngeal swab, presence of viral mutation(s) within the areas targeted by this assay, and inadequate number of viral copies (<250 copies / mL). A negative result must be combined with clinical observations, patient history, and epidemiological information.  Fact Sheet for Patients:    https://www.patel.info/  Fact Sheet for Healthcare Providers: https://hall.com/  This test is not yet approved or  cleared by the [place] FDA and has been authorized for detection and/or diagnosis of SARS-CoV-2 by FDA under an Emergency Use Authorization (EUA).  This EUA will remain in effect (meaning this test can be used) for the duration of the COVID-19 declaration under Section 564(b)(1) of the Act, 21 U.S.C. section 360bbb-3(b)(1), unless the authorization is terminated or revoked sooner.  Performed  at [facility], [address] [place], [place] [address]     Labs: CBC: Recent Labs  Lab 05/31/22 [phone] 06/01/22 0218 06/02/22 0155 06/03/22 0717 06/04/22 0238  WBC 7.6 10.2 13.4* 12.3* 9.6  HGB 15.2  17.0 15.2 14.5 14.8 13.8  HCT 47.1  50.0 45.3 45.3 43.5 41.2  MCV 86.3 83.4 83.7 83.0 82.6  PLT 267 256 247 249 0000000   Basic Metabolic Panel: Recent Labs  Lab 05/31/22 1105 06/01/22 0218 06/02/22 0155 06/03/22 0717 06/04/22 0238  NA 140 136 137 136 132*  K 3.5 3.8 3.6 3.5 3.3*  CL 102 102 101 102 99  CO2 24 22 24  21* 20*  GLUCOSE 110* 126* 115* 113* 100*  BUN 11 11 13  22* 28*  CREATININE 1.18 1.32* 1.40* 1.70* 1.54*  CALCIUM 9.2 9.3 9.0 9.1 9.1  MG 2.2  --  2.2  --  2.6*  PHOS  --   --  3.2  --   --    Liver Function Tests: Recent Labs  Lab 06/02/22 0155  AST 133*  ALT 33  ALKPHOS 73  BILITOT 1.6*  PROT 8.1  ALBUMIN 3.4*  3.5   CBG: No results for input(s): "GLUCAP" in the last 168 hours.  Discharge time spent: greater than 30 minutes.  Signed: Tawni Millers, MD [facility] 06/04/2022

## 2022-06-04 NOTE — Progress Notes (Signed)
Pt ambulates w/o assistance. Pt happy to be referred to CRP2 at [facility].

## 2022-06-04 NOTE — Assessment & Plan Note (Signed)
Calculated BMI 36,8

## 2022-06-04 NOTE — Progress Notes (Addendum)
Rounding Note    Patient Name: Clinton Johnson Date of Encounter: 06/04/2022  [place] Cardiologist: Donato Heinz, MD   Subjective   Sitting up in the bed, ready to go home.   Inpatient Medications    Scheduled Meds:  aspirin EC  81 mg Oral Daily   atorvastatin  80 mg Oral Daily   azithromycin  500 mg Oral Daily   dapagliflozin propanediol  10 mg Oral Daily   docusate sodium  100 mg Oral BID   enoxaparin (LOVENOX) injection  40 mg Subcutaneous Q24H   losartan  25 mg Oral Daily   metoprolol succinate  50 mg Oral Daily   nicotine  14 mg Transdermal Daily   potassium chloride  20 mEq Oral QHS   potassium chloride  40 mEq Oral Once   [START ON 06/05/2022] prasugrel  10 mg Oral Daily   prasugrel  60 mg Oral Once   sodium chloride flush  3 mL Intravenous Q12H   sodium chloride flush  3 mL Intravenous Q12H   sodium chloride flush  3 mL Intravenous Q12H   spironolactone  12.5 mg Oral Daily   Continuous Infusions:  sodium chloride     cefTRIAXone (ROCEPHIN)  IV 2 g (06/03/22 1723)   PRN Meds: sodium chloride, acetaminophen **OR** acetaminophen, bisacodyl, hydrALAZINE, morphine injection, ondansetron **OR** ondansetron (ZOFRAN) IV, mouth rinse, oxyCODONE-acetaminophen, polyethylene glycol, sodium chloride flush   Vital Signs    Vitals:   06/03/22 0756 06/03/22 0841 06/03/22 2115 06/04/22 0526  BP: 111/84 106/72 106/75 108/74  Pulse: 90 97 90 88  Resp: 18  19   Temp: 98.3 F (36.8 C)  99.2 F (37.3 C) 98.8 F (37.1 C)  TempSrc: Oral  Oral Oral  SpO2:   94% 93%  Weight:    130.3 kg  Height:        Intake/Output Summary (Last 24 hours) at 06/04/2022 0855 Last data filed at 06/04/2022 0531 Gross per 24 hour  Intake 240 ml  Output 400 ml  Net -160 ml      06/04/2022    5:26 AM 06/03/2022    6:16 AM 06/02/2022    4:34 AM  Last 3 Weights  Weight (lbs) 287 lb 3.2 oz 284 lb 290 lb 12.8 oz  Weight (kg) 130.273 kg 128.822 kg 131.906 kg      Telemetry     Sinus Rhythm - Personally Reviewed  ECG    No new tracing this morning  Physical Exam   GEN: No acute distress.   Neck: No JVD Cardiac: RRR, no murmurs, rubs, or gallops.  Respiratory: Clear to auscultation bilaterally. GI: Soft, nontender, non-distended  MS: No edema; No deformity. Right radial cath site stable Neuro:  Nonfocal  Psych: Normal affect   Labs    High Sensitivity Troponin:   Recent Labs  Lab 05/31/22 0355 05/31/22 0551 05/31/22 1105 05/31/22 1741 06/01/22 0540  TROPONINIHS 316* 541* 3,215* 11,627* >24,000*     Chemistry Recent Labs  Lab 05/31/22 1105 06/01/22 0218 06/02/22 0155 06/03/22 0717 06/04/22 0238  NA 140   < > 137 136 132*  K 3.5   < > 3.6 3.5 3.3*  CL 102   < > 101 102 99  CO2 24   < > 24 21* 20*  GLUCOSE 110*   < > 115* 113* 100*  BUN 11   < > 13 22* 28*  CREATININE 1.18   < > 1.40* 1.70* 1.54*  CALCIUM 9.2   < >  9.0 9.1 9.1  MG 2.2  --  2.2  --  2.6*  PROT  --   --  8.1  --   --   ALBUMIN  --   --  3.4*  3.5  --   --   AST  --   --  133*  --   --   ALT  --   --  33  --   --   ALKPHOS  --   --  73  --   --   BILITOT  --   --  1.6*  --   --   GFRNONAA >60   < > 60* 47* 53*  ANIONGAP 14   < > 12 13 13    < > = values in this interval not displayed.    Lipids  Recent Labs  Lab 05/31/22 1105  CHOL 264*  TRIG 163*  HDL 43  LDLCALC 188*  CHOLHDL 6.1    Hematology Recent Labs  Lab 06/02/22 0155 06/03/22 0717 06/04/22 0238  WBC 13.4* 12.3* 9.6  RBC 5.41 5.24 4.99  HGB 14.5 14.8 13.8  HCT 45.3 43.5 41.2  MCV 83.7 83.0 82.6  MCH 26.8 28.2 27.7  MCHC 32.0 34.0 33.5  RDW 16.1* 16.1* 16.0*  PLT 247 249 249   Thyroid  Recent Labs  Lab 05/31/22 1105  TSH 4.506*    BNP Recent Labs  Lab 05/31/22 0355 06/04/22 0238  BNP 223.1* 135.3*    DDimer No results for input(s): "DDIMER" in the last 168 hours.   Radiology    CARDIAC CATHETERIZATION  Result Date: 06/02/2022   Ramus lesion is 100% stenosed.   Prox RCA  to Mid RCA lesion is 85% stenosed.   Prox RCA lesion is 40% stenosed.   Non-stenotic Prox LAD to Mid LAD lesion was previously treated.   A drug-eluting stent was successfully placed using a SYNERGY XD 3.50X32.   A drug-eluting stent was successfully placed using a SYNERGY XD 4.0X20.   Post intervention, there is a 0% residual stenosis.   Post intervention, there is a 0% residual stenosis.   LV end diastolic pressure is moderately elevated. Severe 2 vessel obstructive CAD. Compared to last Cath in November 2022 ramus intermediate is now occluded with collaterals. Lesion in the mid RCA has progressed with ulcerative plaque. The LAD is widely patent. Moderately elevated LVEDP 31 mm Hg Successful PCI of the ramus intermediate with DES x 1 and IVUS guidance Successful PCI of the mid RCA with DES x 1 and IVUS guidance Plan: optimize medical therapy for CHF. DAPT for at least one year.    Cardiac Studies   Echo: 05/31/2022  IMPRESSIONS     1. Left ventricular ejection fraction, by estimation, is 30 to 35%. The  left ventricle has moderately decreased function. The left ventricle  demonstrates regional wall motion abnormalities (see scoring  diagram/findings for description). Left ventricular   diastolic parameters are indeterminate.   2. Right ventricular systolic function is normal. The right ventricular  size is normal. Tricuspid regurgitation signal is inadequate for assessing  PA pressure.   3. Left atrial size was mildly dilated.   4. The mitral valve is abnormal. Mild mitral valve regurgitation. No  evidence of mitral stenosis.   5. The tricuspid valve is abnormal.   6. The aortic valve has an indeterminant number of cusps. Aortic valve  regurgitation is not visualized. No aortic stenosis is present.   7. Aortic dilatation noted. There is mild  dilatation of the ascending  aorta, measuring 37 mm.   8. The inferior vena cava is normal in size with greater than 50%  respiratory variability,  suggesting right atrial pressure of 3 mmHg.   FINDINGS   Left Ventricle: Mid to distal anteroseptal, anterolateral, anterior,  inferoseptal walls are akinetic. Entire apex is akinetic. Left ventricular  ejection fraction, by estimation, is 30 to 35%. The left ventricle has  moderately decreased function. The  left ventricle demonstrates regional wall motion abnormalities. The left  ventricular internal cavity size was normal in size. There is no left  ventricular hypertrophy. Left ventricular diastolic parameters are  indeterminate.   Right Ventricle: The right ventricular size is normal. Right vetricular  wall thickness was not well visualized. Right ventricular systolic  function is normal. Tricuspid regurgitation signal is inadequate for  assessing PA pressure.   Left Atrium: Left atrial size was mildly dilated.   Right Atrium: Right atrial size was normal in size.   Pericardium: There is no evidence of pericardial effusion.   Mitral Valve: The mitral valve is abnormal. Mild mitral valve  regurgitation. No evidence of mitral valve stenosis.   Tricuspid Valve: The tricuspid valve is abnormal. Tricuspid valve  regurgitation is mild . No evidence of tricuspid stenosis.   Aortic Valve: The aortic valve has an indeterminant number of cusps.  Aortic valve regurgitation is not visualized. No aortic stenosis is  present. Aortic valve mean gradient measures 1.0 mmHg. Aortic valve peak  gradient measures 2.5 mmHg. Aortic valve  area, by VTI measures 3.18 cm.   Pulmonic Valve: The pulmonic valve was not well visualized. Pulmonic valve  regurgitation is mild. No evidence of pulmonic stenosis.   Aorta: The aortic root is normal in size and structure and aortic  dilatation noted. There is mild dilatation of the ascending aorta,  measuring 37 mm.   Venous: The inferior vena cava is normal in size with greater than 50%  respiratory variability, suggesting right atrial pressure of 3  mmHg.   IAS/Shunts: No atrial level shunt detected by color flow Doppler.   Cath: 06/02/2022    Ramus lesion is 100% stenosed.   Prox RCA to Mid RCA lesion is 85% stenosed.   Prox RCA lesion is 40% stenosed.   Non-stenotic Prox LAD to Mid LAD lesion was previously treated.   A drug-eluting stent was successfully placed using a SYNERGY XD 3.50X32.   A drug-eluting stent was successfully placed using a SYNERGY XD 4.0X20.   Post intervention, there is a 0% residual stenosis.   Post intervention, there is a 0% residual stenosis.   LV end diastolic pressure is moderately elevated.   Severe 2 vessel obstructive CAD. Compared to last Cath in November 2022 ramus intermediate is now occluded with collaterals. Lesion in the mid RCA has progressed with ulcerative plaque. The LAD is widely patent. Moderately elevated LVEDP 31 mm Hg Successful PCI of the ramus intermediate with DES x 1 and IVUS guidance Successful PCI of the mid RCA with DES x 1 and IVUS guidance     Plan: optimize medical therapy for CHF. DAPT for at least one year.   Diagnostic Dominance: Right  Intervention    Patient Profile     52 y.o. male with a hx of CAD and prior stent to LAD, CHF with EF 35-40%, HLD, tobacco use and HTN seen in consultation on 05/31/2022 for the evaluation of chest pain/non-STEMI and CHF at the request of Dr. Sidney Ace.   Assessment &  Plan    NSTEMI -- underwent cardiac cath noted above with PCI/DES x1 to RI and p/mRCA. hsTn peaked at > 24000. Initially placed on DAPT with ASA/Brilinta but developed worsening dyspnea. He will be switched to Effient this morning with initial load of 60mg  then 10mg  daily. Plan for ASA/Effient for at least one year.  -- continue ASA, Effient, atorvastatin 80mg  daily  HFrEF ICM -- echo showed LVEF of 30-35%, mid to distal anteroseptal, anterolateral, anterior, inferoseptal walls, and apex are akinetic. -- did receive IV lasix, had elevated LVEDP on cath. Net - 6.3L --  GDMT: Toprol XL 50mg  daily, losartan 25mg  daily, farxiga, spiro 12.5mg  daily  Rec: 40 mg Lasix daily.   HTN -- tolerating meds, but soft -- continue Toprol, losartan and spiro  HLD -- HDL 43, LDL 188 -- continue atorvastatin 80mg  daily  -- will need FLP/LFTs in 8 weeks  -- suspect he will need lipid clinic referral as an outpatient  Tobacco use -- cessation advised  Will arrange outpatient follow up in the office Referral made to [facility] V[facility] TOC clinic: Yes, 06/23/2022 @ 9 am   For questions or updates, please contact [place] Please consult [email or web address] for contact info under        Signed, Reino Bellis, NP  06/04/2022, 8:55 AM     ATTENDING ATTESTATION  I have seen, examined and evaluated the patient this AM along with Reino Bellis, NP-C.  After reviewing all the available data and chart, we discussed the patients laboratory, study & physical findings as well as symptoms in detail.  I agree with her findings, examination as well as impression recommendations as per our discussion.    Attending adjustments noted in italics.   Stable for d/c this AM - BP stable   ADDENDUM: Had a discussion with the patient about his ejection fraction of 30 to 35% on echo with plans to recheck echo in 3 months.  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  With an EF of less than [id number], CDL license is are not renewed, but we are hoping that his EF will improve on follow-up in 3 months.  I monitored on the very significant importance of taking his Effient and other medications and also smoking cessation.  He needs to talk to DOT/CDL licensing office.     Leonie Man, MD, MS Glenetta Hew, M.D., M.S. Interventional Cardiologist  [facility]  Pager # [phone] Phone # [phone] [address] [place], [place] [address]

## 2022-06-09 ENCOUNTER — Telehealth (HOSPITAL_COMMUNITY): Payer: Self-pay

## 2022-06-09 NOTE — Telephone Encounter (Signed)
Per phase I cardiac rehab, fax referral to [facility]. 

## 2022-06-11 NOTE — Progress Notes (Deleted)
Cardiology Clinic Note   Patient Name: Clinton Johnson Date of Encounter: 06/11/2022  Primary Care Provider:  Patient, No Pcp Per Primary Cardiologist:  Little Ishikawa, MD  Patient Profile    Clinton Johnson 52 year old male presents the clinic today for follow-up evaluation of his coronary artery disease and ischemic cardiomyopathy.  Past Medical History    Past Medical History:  Diagnosis Date   Chronic systolic (congestive) heart failure    Coronary artery disease    Hypertension    Past Surgical History:  Procedure Laterality Date   ABDOMINAL SURGERY     CORONARY PRESSURE/FFR STUDY N/A 01/28/2021   Procedure: INTRAVASCULAR PRESSURE WIRE/FFR STUDY;  Surgeon: Runell Gess, MD;  Location: [facility] INVASIVE CV LAB;  Service: Cardiovascular;  Laterality: N/A;   CORONARY STENT INTERVENTION N/A 06/02/2022   Procedure: CORONARY STENT INTERVENTION;  Surgeon: Swaziland, Peter M, MD;  Location: [facility] INVASIVE CV LAB;  Service: Cardiovascular;  Laterality: N/A;   CORONARY ULTRASOUND/IVUS N/A 06/02/2022   Procedure: Intravascular Ultrasound/IVUS;  Surgeon: Swaziland, Peter M, MD;  Location: [facility] INVASIVE CV LAB;  Service: Cardiovascular;  Laterality: N/A;   LEFT HEART CATH AND CORONARY ANGIOGRAPHY N/A 01/28/2021   Procedure: LEFT HEART CATH AND CORONARY ANGIOGRAPHY;  Surgeon: Runell Gess, MD;  Location: [facility] INVASIVE CV LAB;  Service: Cardiovascular;  Laterality: N/A;   LEFT HEART CATH AND CORONARY ANGIOGRAPHY N/A 06/02/2022   Procedure: LEFT HEART CATH AND CORONARY ANGIOGRAPHY;  Surgeon: Swaziland, Peter M, MD;  Location: [facility] INVASIVE CV LAB;  Service: Cardiovascular;  Laterality: N/A;    Allergies  No Known Allergies  History of Present Illness    Clinton Johnson has a PMH of CAD, HTN, CHF, and obesity.  He presented to the emergency department 05/31/2022 with dyspnea and chest discomfort.  He had not been compliant with his medications at home.  He reported substernal chest pain and right  shoulder pain with radiation to his back and associated dyspnea.  On exam he was noted to have a blood pressure of 149/109 and a heart rate of 110.  His oxygen saturation was 81-86.  He was noted to have diffuse Rales but was not noted to have wheezing.  His high-sensitivity troponins were 316, 541 and 3215.  He was negative for COVID.  His chest x-ray showed bilateral diffuse interstitial infiltrates with more dense images in his lower lobes.  His EKG showed sinus rhythm with ST depression in V3 through V5 and no significant T wave changes 102 bpm.  He was started on Nitropaste and IV heparin.  His procalcitonin was mildly elevated and he was started on antibiotic therapy for community-acquired pneumonia.  His echocardiogram showed reduced LV function with wall motion abnormalities.  He was taken to the cardiac Cath Lab on 06/02/22.  He was noted to have 100% ramus, 85% proximal-mid RCA, 40% proximal RCA, and received PCI with DES x 2 to his ramus and mid RCA.  His previously placed LAD stent was patent and he was noted to have collaterals to his ramus vessel.  He was placed on dual antiplatelet therapy (Effient and aspirin.  His atorvastatin was continued.  He presents to the clinic today for follow-up evaluation and states***.  *** denies chest pain, shortness of breath, lower extremity edema, fatigue, palpitations, melena, hematuria, hemoptysis, diaphoresis, weakness, presyncope, syncope, orthopnea, and PND.  NSTEMI-no chest pain today.  Underwent cardiac catheterization on 06/02/2022.  He received PCI with DES x 2.  Details above.  He was  placed on DAPT. Continue aspirin, Effient, atorvastatin Heart healthy low-sodium diet Increase physical activity as tolerated  HFrEF, ICM-no increased DOE.  Slowly increasing physical activity.  Echocardiogram during time of PCI showed an LVEF of 30-35% with mid-distal anterior septal, anterolateral, anterior, and inferior septal akinesis.  Received IV diuresis  during hospitalization. Continue metoprolol, Farxiga, spironolactone, losartan, furosemide Unable to uptitrate GDMT at this time due to soft blood pressure. Will plan for repeat echocardiogram once GDMT has been optimized x 1 month.  Hyperlipidemia-05/31/2022: Cholesterol 264; HDL 43; LDL Cholesterol 188; Triglycerides 163; VLDL 33 Continue atorvastatin, aspirin Plan for repeat lipids and LFTs in 6 weeks.  Essential hypertension-BP today***. Heart healthy low-sodium diet Continue metoprolol, losartan, spironolactone  Tobacco abuse-continues to***. Tobacco cessation strongly recommended Tobacco cessation information given  Disposition: Follow-up with Dr. Herbie Baltimore or me in 1-2 months.  Home Medications    Prior to Admission medications   Medication Sig Start Date End Date Taking? Authorizing Provider  aspirin 81 MG EC tablet Take 81 mg by mouth daily.    [provider]  atorvastatin (LIPITOR) 80 MG tablet Take 1 tablet (80 mg total) by mouth daily. 06/04/22 07/04/22  Arrien, York Ram, MD  dapagliflozin propanediol (FARXIGA) 10 MG TABS tablet Take 1 tablet (10 mg total) by mouth daily. 06/05/22   Arrien, York Ram, MD  losartan (COZAAR) 25 MG tablet Take 1 tablet (25 mg total) by mouth daily. 05/11/22   Theron Arista, PA-C  metoprolol succinate (TOPROL-XL) 50 MG 24 hr tablet Take 1 tablet (50 mg total) by mouth daily. Take with or immediately following a meal. 06/05/22 07/05/22  Arrien, York Ram, MD  nitroGLYCERIN (NITROSTAT) 0.4 MG SL tablet Place 1 tablet (0.4 mg total) under the tongue every 5 (five) minutes x 3 doses as needed for chest pain. 06/04/22   Arrien, York Ram, MD  prasugrel (EFFIENT) 10 MG TABS tablet Take 1 tablet (10 mg total) by mouth daily. 06/05/22   Arrien, York Ram, MD  spironolactone (ALDACTONE) 25 MG tablet Take 0.5 tablets (12.5 mg total) by mouth daily. 06/05/22 07/05/22  Arrien, York Ram, MD  diphenhydrAMINE (BENADRYL) 25 MG tablet  Take 1 tablet (25 mg total) by mouth every 6 (six) hours. Patient not taking: Reported on 12/09/2015 06/06/15 03/03/20  Azalia Bilis, MD  fluticasone [facility]) 50 MCG/ACT nasal spray Place 2 sprays into both nostrils daily. Patient not taking: Reported on 12/09/2015 06/06/15 03/03/20  Azalia Bilis, MD    Family History    Family History  Problem Relation Age of Onset   Heart attack Mother    He indicated that his mother is deceased. He indicated that his father is deceased.  Social History    Social History   Socioeconomic History   Marital status: Single    Spouse name: Not on file   Number of children: 2   Years of education: Not on file   Highest education level: Not on file  Occupational History   Occupation: Truck driver  Tobacco Use   Smoking status: Every Day    Packs/day: 1    Types: Cigarettes   Smokeless tobacco: Never  Vaping Use   Vaping Use: Never used  Substance and Sexual Activity   Alcohol use: Yes    Comment: rare   Drug use: Never   Sexual activity: Not on file  Other Topics Concern   Not on file  Social History Narrative   Not on file   Social Determinants of Health   Financial  Resource Strain: Low Risk  (06/03/2022)   Overall Financial Resource Strain (CARDIA)    Difficulty of Paying Living Expenses: Not very hard  Food Insecurity: No Food Insecurity (05/31/2022)   Hunger Vital Sign    Worried About Running Out of Food in the Last Year: Never true    Ran Out of Food in the Last Year: Never true  Transportation Needs: No Transportation Needs (05/31/2022)   PRAPARE - Administrator, Civil Service (Medical): No    Lack of Transportation (Non-Medical): No  Physical Activity: Not on file  Stress: Not on file  Social Connections: Not on file  Intimate Partner Violence: Not At Risk (05/31/2022)   Humiliation, Afraid, Rape, and Kick questionnaire    Fear of Current or Ex-Partner: No    Emotionally Abused: No    Physically Abused: No     Sexually Abused: No     Review of Systems    General:  No chills, fever, night sweats or weight changes.  Cardiovascular:  No chest pain, dyspnea on exertion, edema, orthopnea, palpitations, paroxysmal nocturnal dyspnea. Dermatological: No rash, lesions/masses Respiratory: No cough, dyspnea Urologic: No hematuria, dysuria Abdominal:   No nausea, vomiting, diarrhea, bright red blood per rectum, melena, or hematemesis Neurologic:  No visual changes, wkns, changes in mental status. All other systems reviewed and are otherwise negative except as noted above.  Physical Exam    VS:  There were no vitals taken for this visit. , BMI There is no height or weight on file to calculate BMI. GEN: Well nourished, well developed, in no acute distress. HEENT: normal. Neck: Supple, no JVD, carotid bruits, or masses. Cardiac: RRR, no murmurs, rubs, or gallops. No clubbing, cyanosis, edema.  Radials/DP/PT 2+ and equal bilaterally.  Respiratory:  Respirations regular and unlabored, clear to auscultation bilaterally. GI: Soft, nontender, nondistended, BS + x 4. MS: no deformity or atrophy. Skin: warm and dry, no rash. Neuro:  Strength and sensation are intact. Psych: Normal affect.  Accessory Clinical Findings    Recent Labs: 05/31/2022: TSH 4.506 06/02/2022: ALT 33 06/04/2022: B Natriuretic Peptide 135.3; BUN 28; Creatinine, Ser 1.54; Hemoglobin 13.8; Magnesium 2.6; Platelets 249; Potassium 3.3; Sodium 132   Recent Lipid Panel    Component Value Date/Time   CHOL 264 (H) 05/31/2022 1105   TRIG 163 (H) 05/31/2022 1105   HDL 43 05/31/2022 1105   CHOLHDL 6.1 05/31/2022 1105   VLDL 33 05/31/2022 1105   LDLCALC 188 (H) 05/31/2022 1105    No BP recorded.  {Refresh Note OR Click here to enter BP  :1}***    ECG personally reviewed by me today- *** - No acute changes  Echo: 05/31/2022   IMPRESSIONS     1. Left ventricular ejection fraction, by estimation, is 30 to 35%. The  left ventricle has  moderately decreased function. The left ventricle  demonstrates regional wall motion abnormalities (see scoring  diagram/findings for description). Left ventricular   diastolic parameters are indeterminate.   2. Right ventricular systolic function is normal. The right ventricular  size is normal. Tricuspid regurgitation signal is inadequate for assessing  PA pressure.   3. Left atrial size was mildly dilated.   4. The mitral valve is abnormal. Mild mitral valve regurgitation. No  evidence of mitral stenosis.   5. The tricuspid valve is abnormal.   6. The aortic valve has an indeterminant number of cusps. Aortic valve  regurgitation is not visualized. No aortic stenosis is present.   7. Aortic  dilatation noted. There is mild dilatation of the ascending  aorta, measuring 37 mm.   8. The inferior vena cava is normal in size with greater than 50%  respiratory variability, suggesting right atrial pressure of 3 mmHg.   FINDINGS   Left Ventricle: Mid to distal anteroseptal, anterolateral, anterior,  inferoseptal walls are akinetic. Entire apex is akinetic. Left ventricular  ejection fraction, by estimation, is 30 to 35%. The left ventricle has  moderately decreased function. The  left ventricle demonstrates regional wall motion abnormalities. The left  ventricular internal cavity size was normal in size. There is no left  ventricular hypertrophy. Left ventricular diastolic parameters are  indeterminate.   Right Ventricle: The right ventricular size is normal. Right vetricular  wall thickness was not well visualized. Right ventricular systolic  function is normal. Tricuspid regurgitation signal is inadequate for  assessing PA pressure.   Left Atrium: Left atrial size was mildly dilated.   Right Atrium: Right atrial size was normal in size.   Pericardium: There is no evidence of pericardial effusion.   Mitral Valve: The mitral valve is abnormal. Mild mitral valve  regurgitation. No  evidence of mitral valve stenosis.   Tricuspid Valve: The tricuspid valve is abnormal. Tricuspid valve  regurgitation is mild . No evidence of tricuspid stenosis.   Aortic Valve: The aortic valve has an indeterminant number of cusps.  Aortic valve regurgitation is not visualized. No aortic stenosis is  present. Aortic valve mean gradient measures 1.0 mmHg. Aortic valve peak  gradient measures 2.5 mmHg. Aortic valve  area, by VTI measures 3.18 cm.   Pulmonic Valve: The pulmonic valve was not well visualized. Pulmonic valve  regurgitation is mild. No evidence of pulmonic stenosis.   Aorta: The aortic root is normal in size and structure and aortic  dilatation noted. There is mild dilatation of the ascending aorta,  measuring 37 mm.   Venous: The inferior vena cava is normal in size with greater than 50%  respiratory variability, suggesting right atrial pressure of 3 mmHg.   IAS/Shunts: No atrial level shunt detected by color flow Doppler.    Cath: 06/02/2022     Ramus lesion is 100% stenosed.   Prox RCA to Mid RCA lesion is 85% stenosed.   Prox RCA lesion is 40% stenosed.   Non-stenotic Prox LAD to Mid LAD lesion was previously treated.   A drug-eluting stent was successfully placed using a SYNERGY XD 3.50X32.   A drug-eluting stent was successfully placed using a SYNERGY XD 4.0X20.   Post intervention, there is a 0% residual stenosis.   Post intervention, there is a 0% residual stenosis.   LV end diastolic pressure is moderately elevated.   Severe 2 vessel obstructive CAD. Compared to last Cath in November 2022 ramus intermediate is now occluded with collaterals. Lesion in the mid RCA has progressed with ulcerative plaque. The LAD is widely patent. Moderately elevated LVEDP 31 mm Hg Successful PCI of the ramus intermediate with DES x 1 and IVUS guidance Successful PCI of the mid RCA with DES x 1 and IVUS guidance     Plan: optimize medical therapy for CHF. DAPT for at least one  year.    Diagnostic Dominance: Right  Intervention       Assessment & Plan   1.  ***   Thomasene RippleJesse M. Betzalel Umbarger NP-C     06/11/2022, 1:36 PM [facility] [address] Office [phone] Fax [phone]    I spent***minutes  examining this patient, reviewing medications, and using patient centered shared decision making involving her cardiac care.  Prior to her visit I spent greater than 20 minutes reviewing her past medical history,  medications, and prior cardiac tests.

## 2022-06-13 ENCOUNTER — Ambulatory Visit: Payer: Commercial Managed Care - HMO | Attending: General Practice | Admitting: General Practice

## 2022-06-22 ENCOUNTER — Other Ambulatory Visit: Payer: Self-pay

## 2022-06-22 ENCOUNTER — Emergency Department (HOSPITAL_COMMUNITY)
Admission: EM | Admit: 2022-06-22 | Discharge: 2022-06-23 | Disposition: A | Discharge status: Home / Self Care | Payer: Medicaid Other | Attending: Emergency Medicine | Admitting: Emergency Medicine

## 2022-06-22 ENCOUNTER — Encounter (HOSPITAL_COMMUNITY): Payer: Self-pay | Admitting: *Deleted

## 2022-06-22 DIAGNOSIS — K429 Umbilical hernia without obstruction or gangrene: Secondary | ICD-10-CM | POA: Insufficient documentation

## 2022-06-22 DIAGNOSIS — K469 Unspecified abdominal hernia without obstruction or gangrene: Secondary | ICD-10-CM | POA: Diagnosis present

## 2022-06-22 DIAGNOSIS — I110 Hypertensive heart disease with heart failure: Secondary | ICD-10-CM | POA: Diagnosis not present

## 2022-06-22 DIAGNOSIS — Z7982 Long term (current) use of aspirin: Secondary | ICD-10-CM | POA: Insufficient documentation

## 2022-06-22 DIAGNOSIS — I509 Heart failure, unspecified: Secondary | ICD-10-CM | POA: Insufficient documentation

## 2022-06-22 LAB — URINALYSIS, ROUTINE W REFLEX MICROSCOPIC
Bilirubin Urine: NEGATIVE
Glucose, UA: NEGATIVE mg/dL
Hgb urine dipstick: NEGATIVE
Ketones, ur: NEGATIVE mg/dL
Leukocytes,Ua: NEGATIVE
Nitrite: NEGATIVE
Protein, ur: 100 mg/dL — AB
Specific Gravity, Urine: 1.026 (ref 1.005–1.030)
pH: 5 (ref 5.0–8.0)

## 2022-06-22 LAB — COMPREHENSIVE METABOLIC PANEL
ALT: 18 U/L (ref 0–44)
AST: 16 U/L (ref 15–41)
Albumin: 3.2 g/dL — ABNORMAL LOW (ref 3.5–5.0)
Alkaline Phosphatase: 94 U/L (ref 38–126)
Anion gap: 10 (ref 5–15)
BUN: 14 mg/dL (ref 6–20)
CO2: 24 mmol/L (ref 22–32)
Calcium: 8.8 mg/dL — ABNORMAL LOW (ref 8.9–10.3)
Chloride: 105 mmol/L (ref 98–111)
Creatinine, Ser: 1.34 mg/dL — ABNORMAL HIGH (ref 0.61–1.24)
GFR, Estimated: >60 mL/min (ref >60)
Glucose, Bld: 115 mg/dL — ABNORMAL HIGH (ref 70–99)
Potassium: 3.6 mmol/L (ref 3.5–5.1)
Sodium: 139 mmol/L (ref 135–145)
Total Bilirubin: 0.4 mg/dL (ref 0.3–1.2)
Total Protein: 7.2 g/dL (ref 6.5–8.1)

## 2022-06-22 LAB — CBC
HCT: 39.1 % (ref 39.0–52.0)
Hemoglobin: 12.7 g/dL — ABNORMAL LOW (ref 13.0–17.0)
MCH: 27.8 pg (ref 26.0–34.0)
MCHC: 32.5 g/dL (ref 30.0–36.0)
MCV: 85.6 fL (ref 80.0–100.0)
Platelets: 332 10*3/uL (ref 150–400)
RBC: 4.57 MIL/uL (ref 4.22–5.81)
RDW: 15.6 % — ABNORMAL HIGH (ref 11.5–15.5)
WBC: 5.6 10*3/uL (ref 4.0–10.5)
nRBC: 0 % (ref 0.0–0.2)

## 2022-06-22 LAB — LIPASE, BLOOD: Lipase: 43 U/L (ref 11–51)

## 2022-06-22 NOTE — ED Triage Notes (Signed)
The pt has an abdominal  hernia for years and he has had more pain in the past few days

## 2022-06-23 ENCOUNTER — Emergency Department (HOSPITAL_COMMUNITY): Payer: Medicaid Other

## 2022-06-23 ENCOUNTER — Encounter (HOSPITAL_COMMUNITY): Payer: Commercial Managed Care - HMO

## 2022-06-23 MED ORDER — IOHEXOL 350 MG/ML SOLN
75.0000 mL | Freq: Once | INTRAVENOUS | Status: AC | PRN
  Administered 2022-06-23: 75 mL via INTRAVENOUS

## 2022-06-23 NOTE — Discharge Instructions (Addendum)
Your CT scan didn't show any emergent acute findings todays.  Your CT scan did show hernia to your abdomen.  Attached is information for the on-call surgery group, call and set up a follow up appointment regarding todays ED visit. At home you may take over the counter 600 mg ibuprofen every 6 hours and alternate with 500 mg tylenol every 6 hours as needed for no more than 7 days. Follow up with your primary care provider as needed. Return to the ED if you are experiencing increasing/worsening symptoms.

## 2022-06-23 NOTE — ED Provider Notes (Signed)
[place] EMERGENCY DEPARTMENT AT [facility] Provider Note   CSN: [id number] Arrival date & time: 06/22/22  2139     History  Chief Complaint  Patient presents with   Hernia    Clinton Johnson is a 52 y.o. male who presents emergency department with concerns for abdominal hernia that has been going on for several years.  Notes that he has had increased pain over the past couple of days.  He notes that he was seen in the emergency department recently however they did not do anything for his hernia outside to push it back in.  He denies being seen by a surgeon for his hernia.  Denies fever, nausea, vomiting, color change.  The history is provided by the patient. No language interpreter was used.       Home Medications Prior to Admission medications   Medication Sig Start Date End Date Taking? Authorizing Provider  aspirin 81 MG EC tablet Take 81 mg by mouth daily.    [provider]  atorvastatin (LIPITOR) 80 MG tablet Take 1 tablet (80 mg total) by mouth daily. 06/04/22 07/04/22  Arrien, York Ram, MD  dapagliflozin propanediol (FARXIGA) 10 MG TABS tablet Take 1 tablet (10 mg total) by mouth daily. 06/05/22   Arrien, York Ram, MD  losartan (COZAAR) 25 MG tablet Take 1 tablet (25 mg total) by mouth daily. 05/11/22   Theron Arista, PA-C  metoprolol succinate (TOPROL-XL) 50 MG 24 hr tablet Take 1 tablet (50 mg total) by mouth daily. Take with or immediately following a meal. 06/05/22 07/05/22  Arrien, York Ram, MD  nitroGLYCERIN (NITROSTAT) 0.4 MG SL tablet Place 1 tablet (0.4 mg total) under the tongue every 5 (five) minutes x 3 doses as needed for chest pain. 06/04/22   Arrien, York Ram, MD  prasugrel (EFFIENT) 10 MG TABS tablet Take 1 tablet (10 mg total) by mouth daily. 06/05/22   Arrien, York Ram, MD  spironolactone (ALDACTONE) 25 MG tablet Take 0.5 tablets (12.5 mg total) by mouth daily. 06/05/22 07/05/22  Arrien, York Ram, MD  diphenhydrAMINE  (BENADRYL) 25 MG tablet Take 1 tablet (25 mg total) by mouth every 6 (six) hours. Patient not taking: Reported on 12/09/2015 06/06/15 03/03/20  Azalia Bilis, MD  fluticasone [facility]) 50 MCG/ACT nasal spray Place 2 sprays into both nostrils daily. Patient not taking: Reported on 12/09/2015 06/06/15 03/03/20  Azalia Bilis, MD      Allergies    Patient has no known allergies.    Review of Systems   Review of Systems  All other systems reviewed and are negative.   Physical Exam Updated Vital Signs BP (!) 126/99 (BP Location: Right Arm)   Pulse 94   Temp 97.8 F (36.6 C) (Oral)   Resp 19   Ht 6\' 2"  (1.88 m)   Wt 130.3 kg   SpO2 97%   BMI 36.88 kg/m  Physical Exam Vitals and nursing note reviewed.  Constitutional:      General: He is not in acute distress.    Appearance: He is not diaphoretic.  HENT:     Head: Normocephalic and atraumatic.     Mouth/Throat:     Pharynx: No oropharyngeal exudate.  Eyes:     General: No scleral icterus.    Conjunctiva/sclera: Conjunctivae normal.  Cardiovascular:     Rate and Rhythm: Normal rate and regular rhythm.     Pulses: Normal pulses.     Heart sounds: Normal heart sounds.  Pulmonary:  Effort: Pulmonary effort is normal. No respiratory distress.     Breath sounds: Normal breath sounds. No wheezing.  Abdominal:     General: Bowel sounds are normal.     Palpations: Abdomen is soft. There is no mass.     Tenderness: There is no abdominal tenderness. There is no guarding or rebound.     Hernia: A hernia is present. Hernia is present in the umbilical area.     Comments: Palpable umbilical hernia that is easily reduced.  Musculoskeletal:        General: Normal range of motion.     Cervical back: Normal range of motion and neck supple.  Skin:    General: Skin is warm and dry.  Neurological:     Mental Status: He is alert.  Psychiatric:        Behavior: Behavior normal.     ED Results / Procedures / Treatments   Labs (all labs  ordered are listed, but only abnormal results are displayed) Labs Reviewed  COMPREHENSIVE METABOLIC PANEL - Abnormal; Notable for the following components:      Result Value   Glucose, Bld 115 (*)    Creatinine, Ser 1.34 (*)    Calcium 8.8 (*)    Albumin 3.2 (*)    All other components within normal limits  CBC - Abnormal; Notable for the following components:   Hemoglobin 12.7 (*)    RDW 15.6 (*)    All other components within normal limits  URINALYSIS, ROUTINE W REFLEX MICROSCOPIC - Abnormal; Notable for the following components:   Protein, ur 100 (*)    Bacteria, UA RARE (*)    All other components within normal limits  LIPASE, BLOOD    EKG None  Radiology No results found.  Procedures Procedures    Medications Ordered in ED Medications - No data to display  ED Course/ Medical Decision Making/ A&P Clinical Course as of 06/23/22 1045  Mon Jun 23, 2022  0645 During initial H&P of patient, during the physical examination on palpation of abdomen, patient grabbed this providers wrists. I instructed patient to not grab me while I am performing the exam on him. Pt notes that it hurts him, I instructed patient that I am aware, however, I need to perform his physical exam. Pt agreeable to continuance of exam. [SB]  1000 Pt resting comfortably on chair with his clothes on top of his gown. Discussed with patient results of CT scan.  Discussed with patient how it is important that he follows up with the surgery group regarding management of the hernias. Answered all available questions. Offered a work note to which patient declines noting that he works for himself, then patient inquired if he needed to be out of work, I instructed patient that I was being polite and offering a note for him. When asked if patient has additional questions, patient notes "Don't touch my hand" recalling my previous instruction to him to not grab the provider during the physical exam. Patient for safe  discharge at this time. [SB]    Clinical Course User Index [SB] Marylin Lathon A, PA-C                             Medical Decision Making Amount and/or Complexity of Data Reviewed Radiology: ordered.  Risk Prescription drug management.   Pt presents with concerns for chronic abdominal pain onset several years. Hasn't been evaluated by surgeon for this. Vital  signs, pt afebrile. On exam, pt with palpable umbilical hernia that is easily reduced. Differential diagnosis includes incarcerated hernia, strangulated hernia, diverticulitis, appendicitis, cholecystitis.    Co morbidities that complicate the patient evaluation: HTN, CHF, HLD  Labs:  I ordered, and personally interpreted labs.  The pertinent results include:   CMP with Cr at 1.34, improved from previous value otherwise unremarkable CBC unremarkable Urinalysis unremarkable  Imaging: I ordered imaging studies including CT abdomen pelvis with I independently visualized and interpreted imaging which showed:  1. Small upper midline ventral hernia containing only fat. Small  umbilical to supraumbilical region ventral hernia containing only  fat. This appears similar to the immediate prior exam. One could  question if there is slightly more edema affecting the fat of the  umbilical region hernia.  2. Small left inguinal hernia containing only fat, unchanged.  3. Small amount of pleural fluid layering dependently, right more  than left. Cardiomegaly.  4. Aortic atherosclerosis.   I agree with the radiologist interpretation   Disposition: Presenting suspicious for umbilical ventral hernia is easily reducible in the emergency department.  Doubt concerns at this time for incarcerated or strangulated hernia.  Doubt concerns at this time for diverticulitis, appendicitis, cholecystitis.  After consideration of the diagnostic results and the patients response to treatment, I feel that the patient would benefit from Discharge home.  Pt given information for on-call surgery group for follow up. Supportive care measures and strict return precautions discussed with patient at bedside. Pt acknowledges and verbalizes understanding. Pt appears safe for discharge. Follow up as indicated in discharge paperwork.    This chart was dictated using voice recognition software, Dragon. Despite the best efforts of this provider to proofread and correct errors, errors may still occur which can change documentation meaning.   Final Clinical Impression(s) / ED Diagnoses Final diagnoses:  Umbilical hernia without obstruction and without gangrene    Rx / DC Orders ED Discharge Orders     None         Naysa Puskas A, PA-C 06/23/22 1046    Jacalyn Lefevre, MD 06/23/22 1108

## 2022-06-30 ENCOUNTER — Emergency Department (HOSPITAL_BASED_OUTPATIENT_CLINIC_OR_DEPARTMENT_OTHER): Payer: Medicaid Other

## 2022-06-30 ENCOUNTER — Encounter (HOSPITAL_BASED_OUTPATIENT_CLINIC_OR_DEPARTMENT_OTHER): Payer: Self-pay | Admitting: Emergency Medicine

## 2022-06-30 ENCOUNTER — Emergency Department (HOSPITAL_BASED_OUTPATIENT_CLINIC_OR_DEPARTMENT_OTHER)
Admission: EM | Admit: 2022-06-30 | Discharge: 2022-06-30 | Disposition: A | Discharge status: Home / Self Care | Payer: Medicaid Other | Attending: Emergency Medicine | Admitting: Emergency Medicine

## 2022-06-30 ENCOUNTER — Other Ambulatory Visit: Payer: Self-pay

## 2022-06-30 DIAGNOSIS — Z79899 Other long term (current) drug therapy: Secondary | ICD-10-CM | POA: Diagnosis not present

## 2022-06-30 DIAGNOSIS — I110 Hypertensive heart disease with heart failure: Secondary | ICD-10-CM | POA: Insufficient documentation

## 2022-06-30 DIAGNOSIS — I5023 Acute on chronic systolic (congestive) heart failure: Secondary | ICD-10-CM | POA: Diagnosis not present

## 2022-06-30 DIAGNOSIS — Z7982 Long term (current) use of aspirin: Secondary | ICD-10-CM | POA: Insufficient documentation

## 2022-06-30 DIAGNOSIS — I2510 Atherosclerotic heart disease of native coronary artery without angina pectoris: Secondary | ICD-10-CM | POA: Insufficient documentation

## 2022-06-30 DIAGNOSIS — R0602 Shortness of breath: Secondary | ICD-10-CM | POA: Diagnosis present

## 2022-06-30 DIAGNOSIS — F17210 Nicotine dependence, cigarettes, uncomplicated: Secondary | ICD-10-CM | POA: Insufficient documentation

## 2022-06-30 LAB — BASIC METABOLIC PANEL
Anion gap: 8 (ref 5–15)
BUN: 15 mg/dL (ref 6–20)
CO2: 23 mmol/L (ref 22–32)
Calcium: 8.6 mg/dL — ABNORMAL LOW (ref 8.9–10.3)
Chloride: 106 mmol/L (ref 98–111)
Creatinine, Ser: 1.4 mg/dL — ABNORMAL HIGH (ref 0.61–1.24)
GFR, Estimated: 60 mL/min — ABNORMAL LOW (ref >60)
Glucose, Bld: 111 mg/dL — ABNORMAL HIGH (ref 70–99)
Potassium: 3.8 mmol/L (ref 3.5–5.1)
Sodium: 137 mmol/L (ref 135–145)

## 2022-06-30 LAB — CBC WITH DIFFERENTIAL/PLATELET
Abs Immature Granulocytes: 0.01 10*3/uL (ref 0.00–0.07)
Basophils Absolute: 0.1 10*3/uL (ref 0.0–0.1)
Basophils Relative: 1 %
Eosinophils Absolute: 0.2 10*3/uL (ref 0.0–0.5)
Eosinophils Relative: 4 %
HCT: 41.6 % (ref 39.0–52.0)
Hemoglobin: 13.1 g/dL (ref 13.0–17.0)
Immature Granulocytes: 0 %
Lymphocytes Relative: 54 %
Lymphs Abs: 3.2 10*3/uL (ref 0.7–4.0)
MCH: 27.2 pg (ref 26.0–34.0)
MCHC: 31.5 g/dL (ref 30.0–36.0)
MCV: 86.3 fL (ref 80.0–100.0)
Monocytes Absolute: 0.5 10*3/uL (ref 0.1–1.0)
Monocytes Relative: 9 %
Neutro Abs: 1.9 10*3/uL (ref 1.7–7.7)
Neutrophils Relative %: 32 %
Platelets: 263 10*3/uL (ref 150–400)
RBC: 4.82 MIL/uL (ref 4.22–5.81)
RDW: 16.5 % — ABNORMAL HIGH (ref 11.5–15.5)
WBC: 5.9 10*3/uL (ref 4.0–10.5)
nRBC: 0 % (ref 0.0–0.2)

## 2022-06-30 LAB — BRAIN NATRIURETIC PEPTIDE: B Natriuretic Peptide: 481.7 pg/mL — ABNORMAL HIGH (ref 0.0–100.0)

## 2022-06-30 LAB — D-DIMER, QUANTITATIVE: D-Dimer, Quant: 2.03 ug/mL-FEU — ABNORMAL HIGH (ref 0.00–0.50)

## 2022-06-30 MED ORDER — FUROSEMIDE 10 MG/ML IJ SOLN
40.0000 mg | Freq: Once | INTRAMUSCULAR | Status: AC
  Administered 2022-06-30: 40 mg via INTRAVENOUS
  Filled 2022-06-30: qty 4

## 2022-06-30 MED ORDER — IOHEXOL 350 MG/ML SOLN
75.0000 mL | Freq: Once | INTRAVENOUS | Status: AC | PRN
  Administered 2022-06-30: 75 mL via INTRAVENOUS

## 2022-06-30 MED ORDER — FUROSEMIDE 40 MG PO TABS: 40.0000 mg | ORAL_TABLET | Freq: Every day | ORAL | 0 refills | Status: AC

## 2022-06-30 NOTE — ED Provider Notes (Signed)
[facility]Y [facility] Provider Note: Clinton Dell, MD, FACEP  CSN: [id number] MRN: [id number] ARRIVAL: 06/30/22 at 0215 ROOM: MH02/MH02   CHIEF COMPLAINT  Shortness of Breath   HISTORY OF PRESENT ILLNESS  06/30/22 2:36 AM Clinton Johnson is a 52 y.o. male who states he has been short of breath for about a week.  He reports that he was seen here for the same about a week ago and sent to [facility].  He was told he had pneumonia, diagnosed on a CT scan, and got IV antibiotics in the ED but was not discharged home on any antibiotics.  I can find no record of this in his chart but he was seen on 06/22/2022 where he had a CT scan of the abdomen and pelvis which showed a fat-containing umbilical hernia.  He is scheduled for surgical follow-up later this week.  He continues to be short of breath and has been taking Mucinex which she states has made his mouth very dry (I suspect he is taking Mucinex D but he is not sure).  He underwent a cardiac cath on 06/02/2022 for an NSTEMI at which time he was found to have a 100% stenosed ramus lesion, and 85% stenosed proximal RCA to mid RCA lesion and had two drug-eluting stents placed with successful revascularization.  He was discharged on spironolactone, metoprolol and losartan.  His Lasix was discontinued.  Past Medical History:  Diagnosis Date   Chronic systolic (congestive) heart failure (HCC)    Coronary artery disease    Hypertension     Past Surgical History:  Procedure Laterality Date   ABDOMINAL SURGERY     CORONARY PRESSURE/FFR STUDY N/A 01/28/2021   Procedure: INTRAVASCULAR PRESSURE WIRE/FFR STUDY;  Surgeon: Runell Gess, MD;  [facility]: [facility] INVASIVE CV LAB;  Service: Cardiovascular;  Laterality: N/A;   CORONARY STENT INTERVENTION N/A 06/02/2022   Procedure: CORONARY STENT INTERVENTION;  Surgeon: Swaziland, Peter M, MD;  Location: [facility] INVASIVE CV LAB;  Service: Cardiovascular;  Laterality: N/A;   CORONARY ULTRASOUND/IVUS N/A 06/02/2022    Procedure: Intravascular Ultrasound/IVUS;  Surgeon: Swaziland, Peter M, MD;  Location: [facility] INVASIVE CV LAB;  Service: Cardiovascular;  Laterality: N/A;   LEFT HEART CATH AND CORONARY ANGIOGRAPHY N/A 01/28/2021   Procedure: LEFT HEART CATH AND CORONARY ANGIOGRAPHY;  Surgeon: Runell Gess, MD;  Location: [facility] INVASIVE CV LAB;  Service: Cardiovascular;  Laterality: N/A;   LEFT HEART CATH AND CORONARY ANGIOGRAPHY N/A 06/02/2022   Procedure: LEFT HEART CATH AND CORONARY ANGIOGRAPHY;  Surgeon: Swaziland, Peter M, MD;  Location: [facility] INVASIVE CV LAB;  Service: Cardiovascular;  Laterality: N/A;    Family History  Problem Relation Age of Onset   Heart attack Mother     Social History   Tobacco Use   Smoking status: Every Day    Packs/day: 1    Types: Cigarettes   Smokeless tobacco: Never  Vaping Use   Vaping Use: Never used  Substance Use Topics   Alcohol use: Yes    Comment: rare   Drug use: Never    Prior to Admission medications   Medication Sig Start Date End Date Taking? Authorizing Provider  aspirin 81 MG EC tablet Take 81 mg by mouth daily.    [provider]  atorvastatin (LIPITOR) 80 MG tablet Take 1 tablet (80 mg total) by mouth daily. 06/04/22 07/04/22  Arrien, York Ram, MD  dapagliflozin propanediol (FARXIGA) 10 MG TABS tablet Take 1 tablet (10 mg total) by mouth daily. 06/05/22  Arrien, York Ram, MD  losartan (COZAAR) 25 MG tablet Take 1 tablet (25 mg total) by mouth daily. 05/11/22   Theron Arista, PA-C  metoprolol succinate (TOPROL-XL) 50 MG 24 hr tablet Take 1 tablet (50 mg total) by mouth daily. Take with or immediately following a meal. 06/05/22 07/05/22  Arrien, York Ram, MD  nitroGLYCERIN (NITROSTAT) 0.4 MG SL tablet Place 1 tablet (0.4 mg total) under the tongue every 5 (five) minutes x 3 doses as needed for chest pain. 06/04/22   Arrien, York Ram, MD  prasugrel (EFFIENT) 10 MG TABS tablet Take 1 tablet (10 mg total) by mouth daily. 06/05/22   Arrien,  York Ram, MD  spironolactone (ALDACTONE) 25 MG tablet Take 0.5 tablets (12.5 mg total) by mouth daily. 06/05/22 07/05/22  Arrien, York Ram, MD  diphenhydrAMINE (BENADRYL) 25 MG tablet Take 1 tablet (25 mg total) by mouth every 6 (six) hours. Patient not taking: Reported on 12/09/2015 06/06/15 03/03/20  Azalia Bilis, MD  fluticasone [facility]) 50 MCG/ACT nasal spray Place 2 sprays into both nostrils daily. Patient not taking: Reported on 12/09/2015 06/06/15 03/03/20  Azalia Bilis, MD    Allergies Patient has no known allergies.   REVIEW OF SYSTEMS  Negative except as noted here or in the History of Present Illness.   PHYSICAL EXAMINATION  Initial Vital Signs Blood pressure (!) 144/103, pulse (!) 102, temperature 98.5 F (36.9 C), temperature source Oral, resp. rate 20, height 6\' 2"  (1.88 m), weight 130 kg, SpO2 96 %.  Examination General: Well-developed, well-nourished male in no acute distress; appearance consistent with age of record HENT: normocephalic; atraumatic Eyes: Normal appearance Neck: supple Heart: regular rate and rhythm; tachycardia Lungs: Mildly decreased air movement bilaterally Abdomen: soft; nondistended; nontender; bowel sounds present Extremities: No deformity; full range of motion Neurologic: Awake, alert and oriented; motor function intact in all extremities and symmetric; no facial droop Skin: Warm and dry Psychiatric: Normal mood and affect   RESULTS  Summary of this visit's results, reviewed and interpreted by myself:   EKG Interpretation  Date/Time:  Monday June 30 2022 02:39:36 EDT Ventricular Rate:  97 PR Interval:  155 QRS Duration: 106 QT Interval:  397 QTC Calculation: 505 R Axis:   33 Text Interpretation: Sinus rhythm Ventricular premature complex Probable left atrial enlargement Borderline repolarization abnormality Prolonged QT interval Confirmed by Paula Libra ([id number]) on 06/30/2022 2:42:47 AM       Laboratory Studies: Results  for orders placed or performed during the hospital encounter of 06/30/22 (from the past 24 hour(s))  CBC with Differential     Status: Abnormal   Collection Time: 06/30/22  3:31 AM  Result Value Ref Range   WBC 5.9 4.0 - 10.5 K/uL   RBC 4.82 4.22 - 5.81 MIL/uL   Hemoglobin 13.1 13.0 - 17.0 g/dL   HCT 19.1 47.8 - 29.5 %   MCV 86.3 80.0 - 100.0 fL   MCH 27.2 26.0 - 34.0 pg   MCHC 31.5 30.0 - 36.0 g/dL   RDW 62.1 (H) 30.8 - 65.7 %   Platelets 263 150 - 400 K/uL   nRBC 0.0 0.0 - 0.2 %   Neutrophils Relative % 32 %   Neutro Abs 1.9 1.7 - 7.7 K/uL   Lymphocytes Relative 54 %   Lymphs Abs 3.2 0.7 - 4.0 K/uL   Monocytes Relative 9 %   Monocytes Absolute 0.5 0.1 - 1.0 K/uL   Eosinophils Relative 4 %   Eosinophils Absolute 0.2 0.0 - 0.5 K/uL  Basophils Relative 1 %   Basophils Absolute 0.1 0.0 - 0.1 K/uL   Immature Granulocytes 0 %   Abs Immature Granulocytes 0.01 0.00 - 0.07 K/uL  Basic metabolic panel     Status: Abnormal   Collection Time: 06/30/22  3:31 AM  Result Value Ref Range   Sodium 137 135 - 145 mmol/L   Potassium 3.8 3.5 - 5.1 mmol/L   Chloride 106 98 - 111 mmol/L   CO2 23 22 - 32 mmol/L   Glucose, Bld 111 (H) 70 - 99 mg/dL   BUN 15 6 - 20 mg/dL   Creatinine, Ser 1.61 (H) 0.61 - 1.24 mg/dL   Calcium 8.6 (L) 8.9 - 10.3 mg/dL   GFR, Estimated 60 (L) >60 mL/min   Anion gap 8 5 - 15  Brain natriuretic peptide     Status: Abnormal   Collection Time: 06/30/22  3:31 AM  Result Value Ref Range   B Natriuretic Peptide 481.7 (H) 0.0 - 100.0 pg/mL  D-dimer, quantitative     Status: Abnormal   Collection Time: 06/30/22  3:31 AM  Result Value Ref Range   D-Dimer, Quant 2.03 (H) 0.00 - 0.50 ug/mL-FEU   Imaging Studies: CT Angio Chest PE W and/or Wo Contrast  Result Date: 06/30/2022 CLINICAL DATA:  Pulmonary embolism suspected. EXAM: CT ANGIOGRAPHY CHEST WITH CONTRAST TECHNIQUE: Multidetector CT imaging of the chest was performed using the standard protocol during bolus  administration of intravenous contrast. Multiplanar CT image reconstructions and MIPs were obtained to evaluate the vascular anatomy. RADIATION DOSE REDUCTION: This exam was performed according to the departmental dose-optimization program which includes automated exposure control, adjustment of the mA and/or kV according to patient size and/or use of iterative reconstruction technique. CONTRAST:  75mL OMNIPAQUE IOHEXOL 350 MG/ML SOLN COMPARISON:  03/06/2020 chest CT FINDINGS: Cardiovascular: Satisfactory opacification of the pulmonary arteries to the segmental level. No evidence of pulmonary embolism. Borderline heart size. Coronary stenting in the left circulation at least. No systemic arterial enhancement to assess the aorta. Mediastinum/Nodes: There are thickened lymph nodes in the mediastinum including a right paratracheal node measuring 17 mm in thickness, similar to 2022 and likely congestive in this setting. Lungs/Pleura: Small layering pleural effusions. Diffuse interlobular septal thickening and ground-glass opacity with airway cuffing. Upper Abdomen: Hepatic venous reflux. Musculoskeletal: No acute finding Review of the MIP images confirms the above findings. IMPRESSION: 1. CHF. 2. Negative for pulmonary embolism. Electronically Signed   By: Tiburcio Pea M.D.   On: 06/30/2022 05:00   DG Chest 2 View  Result Date: 06/30/2022 CLINICAL DATA:  Shortness of breath EXAM: CHEST - 2 VIEW COMPARISON:  05/31/2022 FINDINGS: Cardiac shadow is enlarged but stable. Central vascular congestion is noted. Lungs are well aerated bilaterally. Improved aeration in the bases is noted. No sizable effusion is noted. No bony abnormality is seen. IMPRESSION: Persistent central vascular congestion with improved aeration in the lungs bilaterally. Electronically Signed   By: Alcide Clever M.D.   On: 06/30/2022 03:22    ED COURSE and MDM  Nursing notes, initial and subsequent vitals signs, including pulse oximetry, reviewed  and interpreted by myself.  Vitals:   06/30/22 0330 06/30/22 0445 06/30/22 0530 06/30/22 0545  BP:  (!) 130/111 (!) 128/97 (!) 135/100  Pulse: (!) 105 100 99 98  Resp: (!) 34 (!) 35 20 (!) 36  Temp:      TempSrc:      SpO2: 99% 96% 97% 98%  Weight:  Height:       Medications  iohexol (OMNIPAQUE) 350 MG/ML injection 75 mL (75 mLs Intravenous Contrast Given 06/30/22 0442)  furosemide (LASIX) injection 40 mg (40 mg Intravenous Given 06/30/22 0522)   5:20 AM The patient's chest x-ray and CT are showing congestive heart failure.  His BNP is elevated.  I discussed this with Dr. Jayme Cloud of cardiology.  He thinks we should restart the patient on Lasix.  He was previously on 40 mg daily.  We will give him a dose of IV Lasix in the ED prior to discharge.  6:01 AM Patient has diuresed several 100 mL and is feeling better.  His oxygen saturation is now 97% on room air.  His respiratory rate is 18.  I will send a note to his cardiologist, Peter Swaziland.   PROCEDURES  Procedures   ED DIAGNOSES     ICD-10-CM   1. Acute on chronic systolic congestive heart failure (HCC)  I50.23          Branson Kranz, Jonny Ruiz, MD 06/30/22 [phone]

## 2022-06-30 NOTE — ED Notes (Signed)
Pt placed on 2 L O2 after desatting to 87

## 2022-06-30 NOTE — ED Notes (Signed)
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 EDP made aware.

## 2022-06-30 NOTE — ED Triage Notes (Signed)
Pt states SOB for over a week. States was seen here for same and was sent to [facility]. States was told has Pneumonia and got IV ABX and was told he was getting a Rx for ABX but was not on AVS. States was told to take Musinex OTC and feel like it has "dried him out" and has made SOB worse.

## 2022-06-30 NOTE — ED Notes (Signed)
EDP notified of tachypnea

## 2022-06-30 NOTE — ED Notes (Signed)
Pt taken off 2L per Edp request

## 2022-07-11 ENCOUNTER — Telehealth (HOSPITAL_COMMUNITY): Payer: Self-pay | Admitting: Licensed Clinical Social Worker

## 2022-07-11 ENCOUNTER — Encounter (HOSPITAL_COMMUNITY): Payer: Self-pay

## 2022-07-11 NOTE — Telephone Encounter (Signed)
CSW contacted patient to follow up on today's cancelled [facility] appointment. Patient denies transportation concerns as he states he has a car. He reports he is single and resides in a family home. He states he is not currently working and would like to apply for disability although having some difficulty navigating the system. He reports that he has [place] insurance through [employer] although has been told by various healthcare touch points that it has been terminated. Patient plans to come to rescheduled appointment at [facility]. CSW will have scheduler contact to get appointment and notify [employer] of contact information for further assistance with potential medicaid application. CSW continues to be available as needed. Lasandra Beech, LCSW, CCSW-MCS [phone]

## 2022-07-16 ENCOUNTER — Telehealth (HOSPITAL_COMMUNITY): Payer: Self-pay | Admitting: Licensed Clinical Social Worker

## 2022-07-16 NOTE — Telephone Encounter (Signed)
CSW contacted patient to follow up on uninsured status.  CSW was informed by [employer] that they are trying to reach him to complete Medicaid application and have sent patient documents via [employer] on 07-03-22 with no response. Patient to contact Marjory Sneddon at [employer] [phone] for further assistance. CSW left message as unable to speak with patient. CSW available as needed. Lasandra Beech, LCSW, CCSW-MCS [phone]

## 2022-07-25 ENCOUNTER — Emergency Department (HOSPITAL_BASED_OUTPATIENT_CLINIC_OR_DEPARTMENT_OTHER)
Admission: EM | Admit: 2022-07-25 | Discharge: 2022-07-25 | Disposition: A | Discharge status: Home / Self Care | Payer: Medicaid Other | Attending: Emergency Medicine | Admitting: Emergency Medicine

## 2022-07-25 ENCOUNTER — Encounter (HOSPITAL_BASED_OUTPATIENT_CLINIC_OR_DEPARTMENT_OTHER): Payer: Self-pay

## 2022-07-25 ENCOUNTER — Emergency Department (HOSPITAL_BASED_OUTPATIENT_CLINIC_OR_DEPARTMENT_OTHER): Payer: Medicaid Other

## 2022-07-25 ENCOUNTER — Other Ambulatory Visit: Payer: Self-pay

## 2022-07-25 DIAGNOSIS — I110 Hypertensive heart disease with heart failure: Secondary | ICD-10-CM | POA: Insufficient documentation

## 2022-07-25 DIAGNOSIS — F17210 Nicotine dependence, cigarettes, uncomplicated: Secondary | ICD-10-CM | POA: Diagnosis not present

## 2022-07-25 DIAGNOSIS — I5022 Chronic systolic (congestive) heart failure: Secondary | ICD-10-CM | POA: Insufficient documentation

## 2022-07-25 DIAGNOSIS — R0602 Shortness of breath: Secondary | ICD-10-CM | POA: Diagnosis not present

## 2022-07-25 DIAGNOSIS — Z7982 Long term (current) use of aspirin: Secondary | ICD-10-CM | POA: Diagnosis not present

## 2022-07-25 DIAGNOSIS — Z79899 Other long term (current) drug therapy: Secondary | ICD-10-CM | POA: Diagnosis not present

## 2022-07-25 MED ORDER — ALBUTEROL SULFATE HFA 108 (90 BASE) MCG/ACT IN AERS
2.0000 | INHALATION_SPRAY | RESPIRATORY_TRACT | Status: DC | PRN
  Administered 2022-07-25: 2 via RESPIRATORY_TRACT
  Filled 2022-07-25: qty 6.7

## 2022-07-25 NOTE — ED Triage Notes (Signed)
Complaining of shortness of breath for 2 -3 weeks, feels like he has fluid building up in his lungs. Has been coughing.

## 2022-07-25 NOTE — ED Provider Notes (Addendum)
[facility]-EMERGENCY DEPT [facility] Provider Note: Lowella Dell, MD, FACEP  CSN: [id number] MRN: [id number] ARRIVAL: 07/25/22 at 0457 ROOM: MH05/MH05   CHIEF COMPLAINT  Shortness of Breath   HISTORY OF PRESENT ILLNESS  07/25/22 5:14 AM Cuinn Bechel is a 52 y.o. male with history of chronic systolic congestive heart failure.  He was seen by myself on 06/10/2022 for shortness of breath.  Chest x-ray showed central vascular congestion and CT angio chest showed CHF.  He was given 40 mg of IV Lasix with moderate diuresis and improvement in his dyspnea.  He was discharged home on Lasix 40 mg daily which had been discontinued by his cardiologist.   He states he has been compliant with his Lasix but has not yet followed up with his cardiologist due to some insurance issues.  For the past 2 to 3 weeks he has had episodic shortness of breath.  He feels like his abdomen gets distended and this causes him to have trouble breathing.  When he has a significant bowel movement his symptoms are relieved and his breathing improves.  He is not having any abdominal pain or vomiting.  He is not having any chest pain.   Past Medical History:  Diagnosis Date   Chronic systolic (congestive) heart failure (HCC)    Coronary artery disease    Hypertension     Past Surgical History:  Procedure Laterality Date   ABDOMINAL SURGERY     CORONARY PRESSURE/FFR STUDY N/A 01/28/2021   Procedure: INTRAVASCULAR PRESSURE WIRE/FFR STUDY;  Surgeon: Runell Gess, MD;  Location: [facility] INVASIVE CV LAB;  Service: Cardiovascular;  Laterality: N/A;   CORONARY STENT INTERVENTION N/A 06/02/2022   Procedure: CORONARY STENT INTERVENTION;  Surgeon: Swaziland, Peter M, MD;  Location: [facility] INVASIVE CV LAB;  Service: Cardiovascular;  Laterality: N/A;   CORONARY ULTRASOUND/IVUS N/A 06/02/2022   Procedure: Intravascular Ultrasound/IVUS;  Surgeon: Swaziland, Peter M, MD;  Location: [facility] INVASIVE CV LAB;  Service: Cardiovascular;  Laterality: N/A;   LEFT HEART  CATH AND CORONARY ANGIOGRAPHY N/A 01/28/2021   Procedure: LEFT HEART CATH AND CORONARY ANGIOGRAPHY;  Surgeon: Runell Gess, MD;  Location: [facility] INVASIVE CV LAB;  Service: Cardiovascular;  Laterality: N/A;   LEFT HEART CATH AND CORONARY ANGIOGRAPHY N/A 06/02/2022   Procedure: LEFT HEART CATH AND CORONARY ANGIOGRAPHY;  Surgeon: Swaziland, Peter M, MD;  Location: [facility] INVASIVE CV LAB;  Service: Cardiovascular;  Laterality: N/A;    Family History  Problem Relation Age of Onset   Heart attack Mother     Social History   Tobacco Use   Smoking status: Every Day    Packs/day: 1    Types: Cigarettes   Smokeless tobacco: Never  Vaping Use   Vaping Use: Never used  Substance Use Topics   Alcohol use: Yes    Comment: rare   Drug use: Never    Prior to Admission medications   Medication Sig Start Date End Date Taking? Authorizing Provider  aspirin 81 MG EC tablet Take 81 mg by mouth daily.    [provider]  atorvastatin (LIPITOR) 80 MG tablet Take 1 tablet (80 mg total) by mouth daily. 06/04/22 07/04/22  Arrien, York Ram, MD  dapagliflozin propanediol (FARXIGA) 10 MG TABS tablet Take 1 tablet (10 mg total) by mouth daily. 06/05/22   Arrien, York Ram, MD  furosemide (LASIX) 40 MG tablet Take 1 tablet (40 mg total) by mouth daily. 06/30/22   Aniketh Huberty, MD  losartan (COZAAR) 25 MG tablet Take 1  tablet (25 mg total) by mouth daily. 05/11/22   Theron Arista, PA-C  metoprolol succinate (TOPROL-XL) 50 MG 24 hr tablet Take 1 tablet (50 mg total) by mouth daily. Take with or immediately following a meal. 06/05/22 07/05/22  Arrien, York Ram, MD  nitroGLYCERIN (NITROSTAT) 0.4 MG SL tablet Place 1 tablet (0.4 mg total) under the tongue every 5 (five) minutes x 3 doses as needed for chest pain. 06/04/22   Arrien, York Ram, MD  prasugrel (EFFIENT) 10 MG TABS tablet Take 1 tablet (10 mg total) by mouth daily. 06/05/22   Arrien, York Ram, MD  spironolactone (ALDACTONE) 25 MG tablet  Take 0.5 tablets (12.5 mg total) by mouth daily. 06/05/22 07/05/22  Arrien, York Ram, MD  diphenhydrAMINE (BENADRYL) 25 MG tablet Take 1 tablet (25 mg total) by mouth every 6 (six) hours. Patient not taking: Reported on 12/09/2015 06/06/15 03/03/20  Azalia Bilis, MD  fluticasone [facility]) 50 MCG/ACT nasal spray Place 2 sprays into both nostrils daily. Patient not taking: Reported on 12/09/2015 06/06/15 03/03/20  Azalia Bilis, MD    Allergies Patient has no known allergies.   REVIEW OF SYSTEMS  Negative except as noted here or in the History of Present Illness.   PHYSICAL EXAMINATION  Initial Vital Signs Blood pressure (!) 133/101, pulse 93, temperature 97.7 F (36.5 C), temperature source Oral, resp. rate 18, height 6\' 1"  (1.854 m), weight 127 kg, SpO2 97 %.  Examination General: Well-developed, well-nourished male in no acute distress; appearance consistent with age of record HENT: normocephalic; atraumatic Eyes: Normal appearance Neck: supple Heart: regular rate and rhythm Lungs: clear to auscultation bilaterally Abdomen: soft; nondistended; nontender; bowel sounds present Extremities: No deformity; full range of motion Neurologic: Awake, alert and oriented; motor function intact in all extremities and symmetric; no facial droop Skin: Warm and dry Psychiatric: Normal mood and affect   RESULTS  Summary of this visit's results, reviewed and interpreted by myself:   EKG Interpretation  Date/Time:    Ventricular Rate:    PR Interval:    QRS Duration:   QT Interval:    QTC Calculation:   R Axis:     Text Interpretation:         Laboratory Studies: No results found for this or any previous visit (from the past 24 hour(s)). Imaging Studies: DG Abdomen 1 View  Result Date: 07/25/2022 CLINICAL DATA:  52 year old male with shortness of breath. Abdominal distension. EXAM: ABDOMEN - 1 VIEW COMPARISON:  CT Abdomen and Pelvis 06/23/2022. FINDINGS: Two views of the abdomen and  pelvis at 0552 hours. Non obstructed bowel gas pattern. Abdominal and pelvic visceral contours appear stable and normal. Lung bases reported on portable chest x-ray separately today. No evidence of pneumoperitoneum on these two views. No acute osseous abnormality identified. IMPRESSION: Normal bowel gas pattern. Electronically Signed   By: Odessa Fleming M.D.   On: 07/25/2022 06:17   DG Chest 2 View  Result Date: 07/25/2022 CLINICAL DATA:  52 year old male with shortness of breath. Abdominal distension. EXAM: CHEST - 2 VIEW COMPARISON:  CTA chest 06/30/2022 and earlier. FINDINGS: PA and lateral views of the chest at 0543 hours. Mildly improved lung volumes compared to radiographs 06/30/2022. Stable mild cardiomegaly. Generalized increased pulmonary interstitial opacity similar to multiple prior exams, and not significantly improved from last month. Associated trace pleural fluid in the fissures. No pneumothorax, consolidation or significant layering effusion. Visualized tracheal air column is within normal limits. No acute osseous abnormality identified. IMPRESSION: Cardiomegaly and pulmonary edema  with trace pleural fluid very similar to last month. Electronically Signed   By: Odessa Fleming M.D.   On: 07/25/2022 06:16    ED COURSE and MDM  Nursing notes, initial and subsequent vitals signs, including pulse oximetry, reviewed and interpreted by myself.  Vitals:   07/25/22 0510 07/25/22 0511  BP: (!) 133/101   Pulse: 93   Resp: 18   Temp: 97.7 F (36.5 C)   TempSrc: Oral   SpO2: 97%   Weight:  127 kg  Height:  6\' 1"  (1.854 m)   Medications  albuterol (VENTOLIN HFA) 108 (90 Base) MCG/ACT inhaler 2 puff (has no administration in time range)    The patient's chest x-ray does not appear significantly different from my previous experience.  His lungs sound clear, he is not tachypneic and his oxygen saturation is 97%.  He appears to have a fairly significant stool burden on his KUB and he may benefit from a  laxative.  He also tells me he has had a cough at times and has gotten relief by inhaling boiling lemon water.  He may benefit from an albuterol inhaler as he is a daily smoker and this may be contributing to his dyspnea.  6:42 AM Patient got significant relief with albuterol.  He was given an albuterol inhaler and AeroChamber and instructed in their use.  PROCEDURES  Procedures   ED DIAGNOSES     ICD-10-CM   1. Shortness of breath  R06.02          Paula Libra, MD 07/25/22 1610    Paula Libra, MD 07/25/22 [phone]

## 2022-08-11 ENCOUNTER — Telehealth (HOSPITAL_COMMUNITY): Payer: Self-pay | Admitting: Adult Health

## 2022-08-12 ENCOUNTER — Telehealth (HOSPITAL_COMMUNITY): Payer: Self-pay | Admitting: Adult Health

## 2022-09-01 IMAGING — DX DG CHEST 1V PORT
1 series · 1 of 1 positions shown · non-contrast
Comparison: None.

CLINICAL DATA: Shortness of breath

EXAM:
PORTABLE CHEST 1 VIEW

[chest ap]
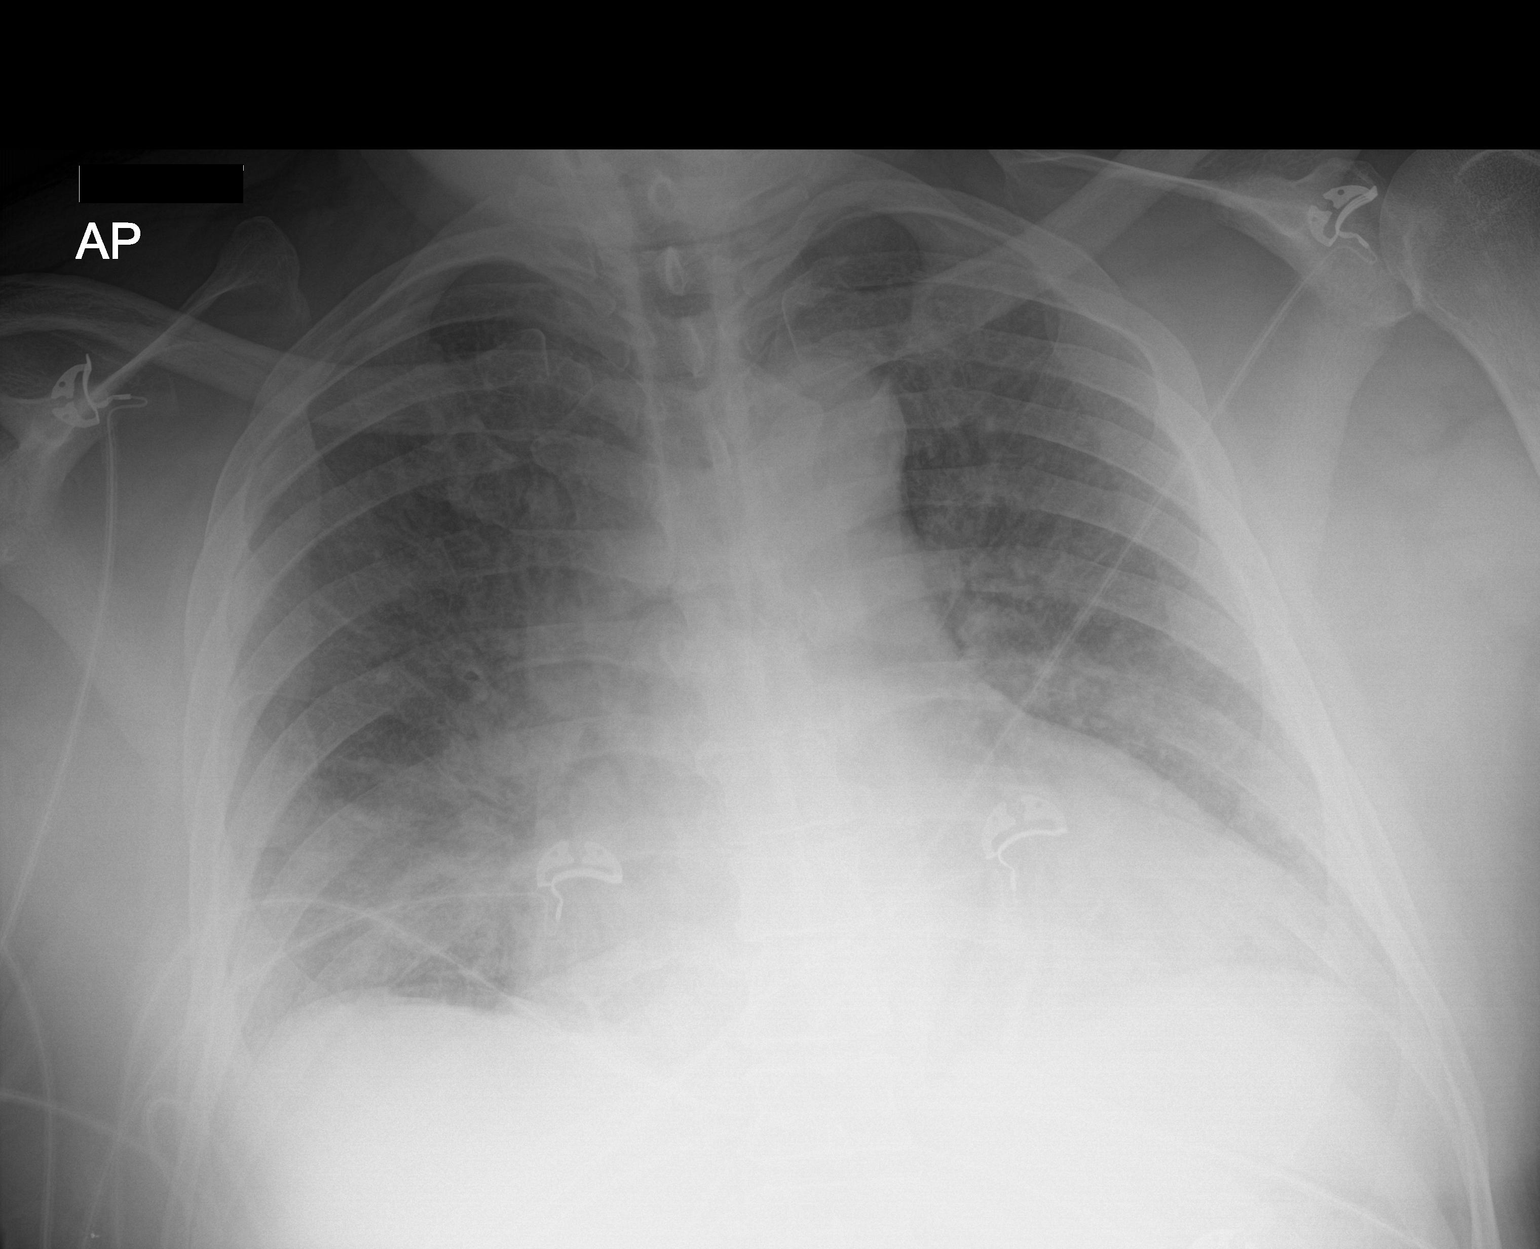

[1 of 1 positions shown; findings below may reference images not displayed]

FINDINGS: Bilateral interstitial thickening and hazy right lower lobe airspace
disease concerning for pulmonary edema versus pneumonia. No pleural
effusion or pneumothorax. Stable cardiomediastinal silhouette. No
aggressive osseous lesion.
IMPRESSION: Bilateral interstitial thickening and hazy right lower lobe airspace
disease concerning for pulmonary edema versus pneumonia.

## 2022-12-02 IMAGING — CT CT CHEST W/O CM
2 of 3 series · 15 of 36 positions shown, 18 images · non-contrast
Comparison: None.

CLINICAL DATA: Dyspnea

EXAM:
CT CHEST WITHOUT CONTRAST
TECHNIQUE: Multidetector CT imaging of the chest was performed following the
standard protocol without IV contrast.

[Series 2: thorax · axial · 0.85mm/px · z∈[+1067,+1343]mm · 12 of 162 slices shown, 15 images]
[im 12/162  mediastinal]
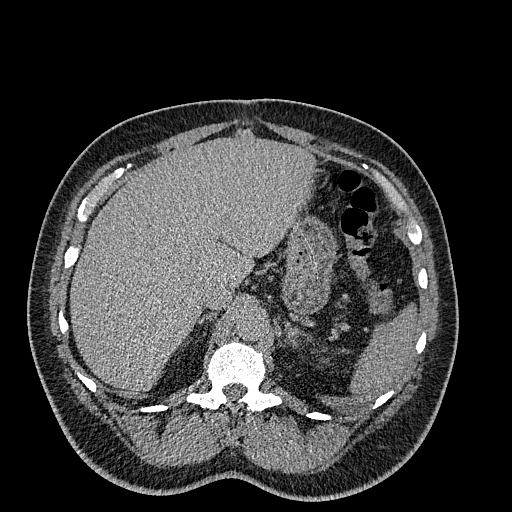
[im 12/162  lung]
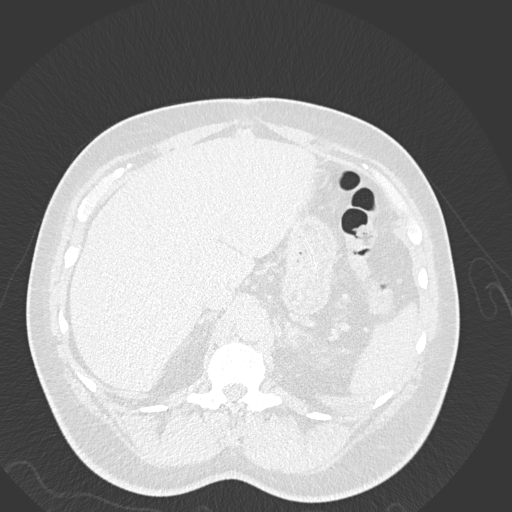
[im 24/162  lung]
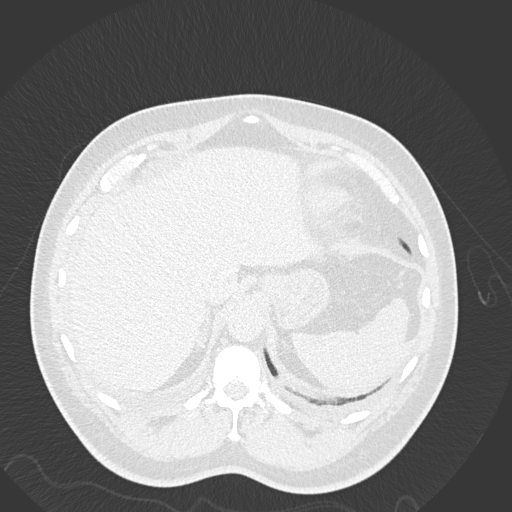
[im 36/162  lung]
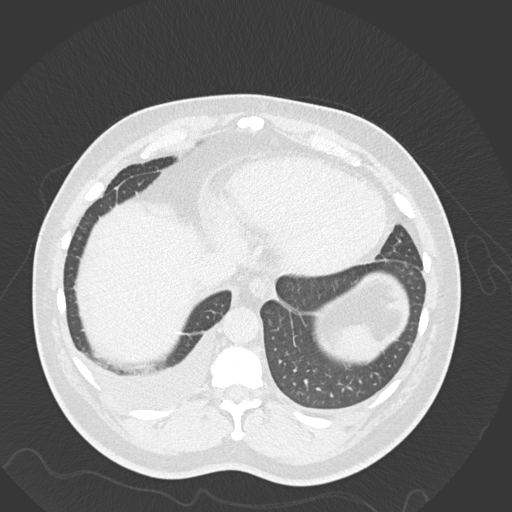
[im 48/162  lung]
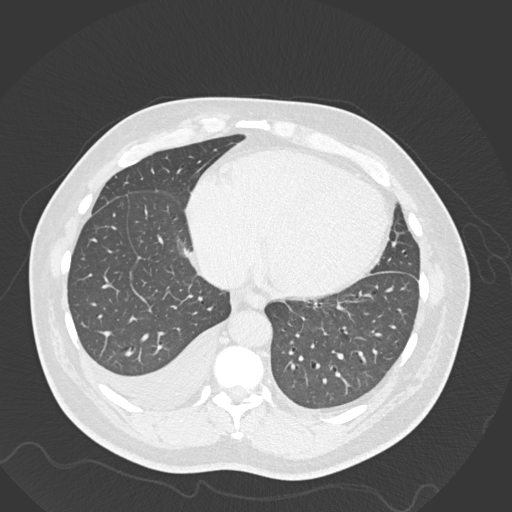
[im 60/162  mediastinal]
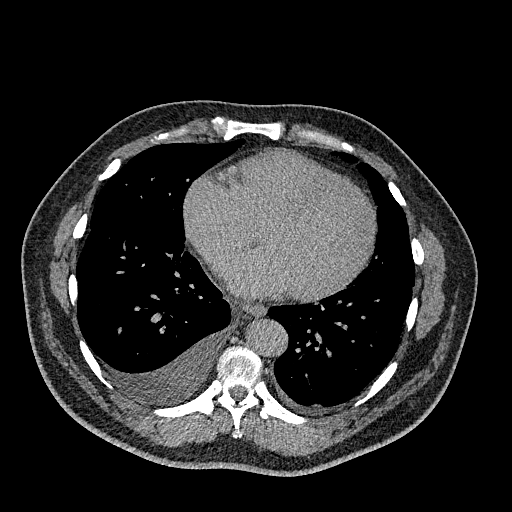
[im 60/162  lung]
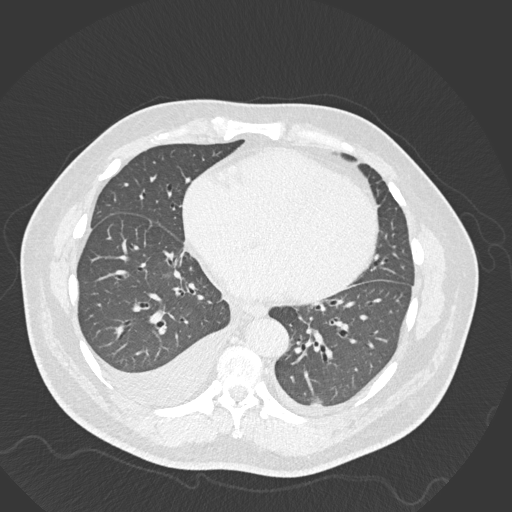
[im 72/162  lung]
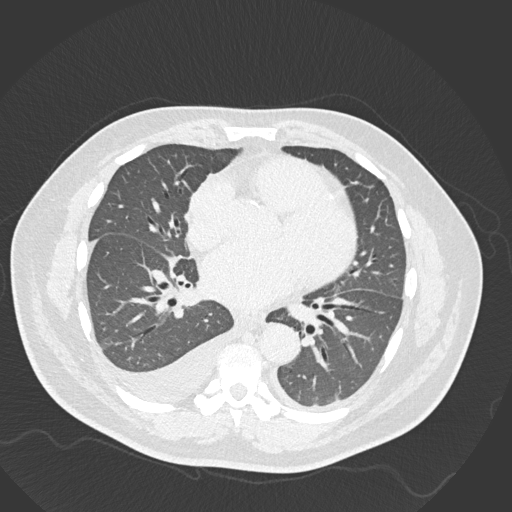
[im 90/162  lung]
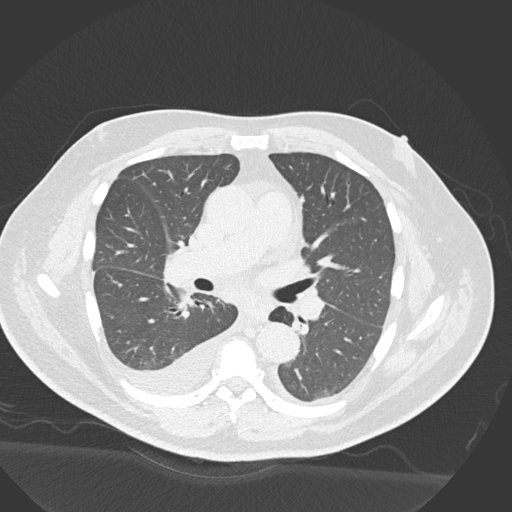
[im 102/162  lung]
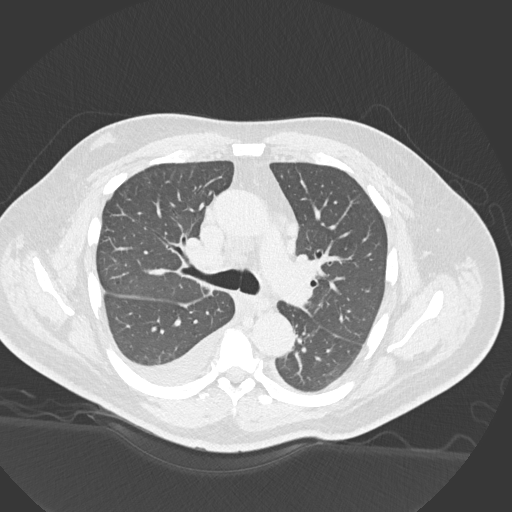
[im 114/162  mediastinal]
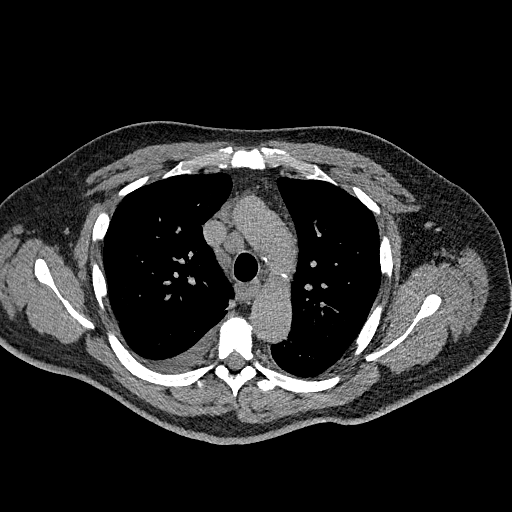
[im 114/162  lung]
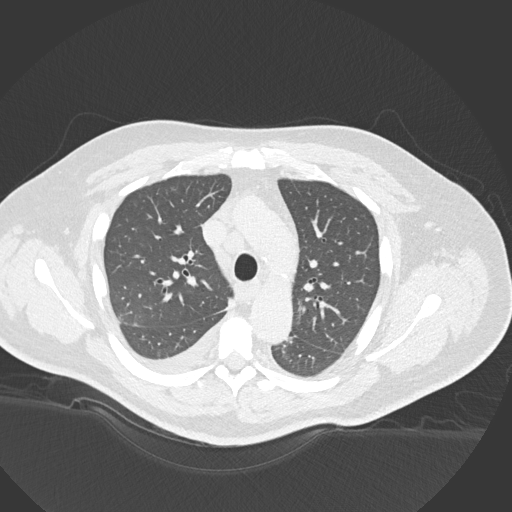
[im 126/162  lung]
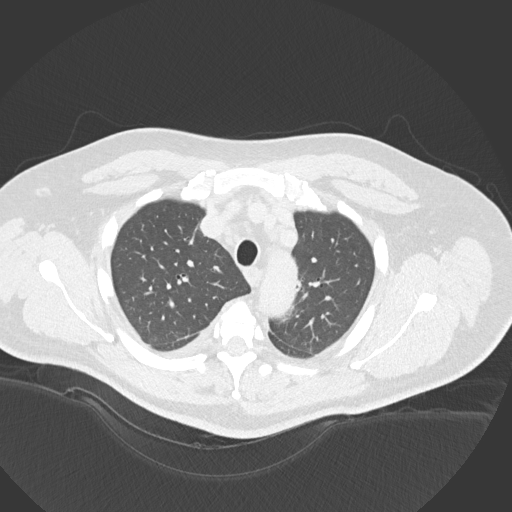
[im 138/162  lung]
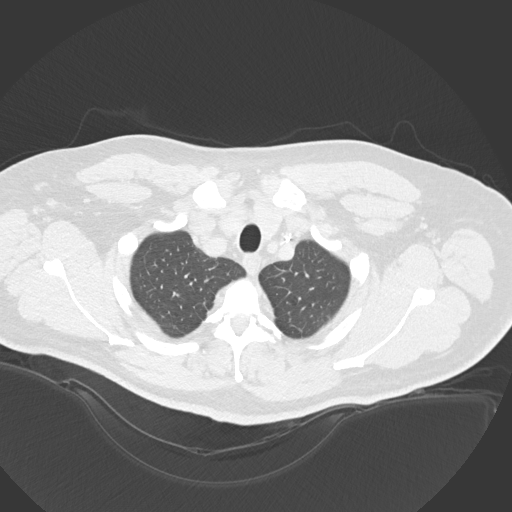
[im 150/162  lung]
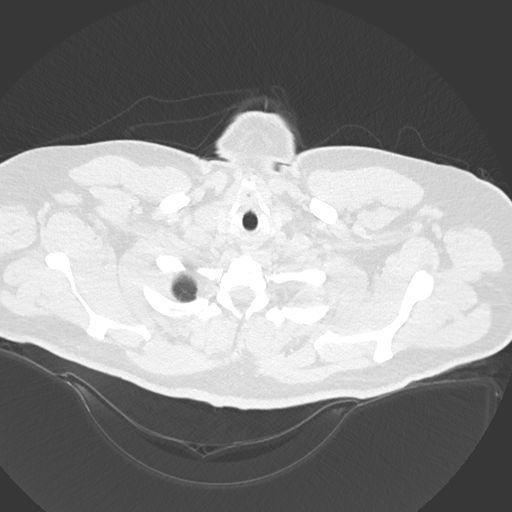

[Series 5: coronal · coronal · 0.67mm/px · 3 of 146 slices shown]
[im 30/146  lung]
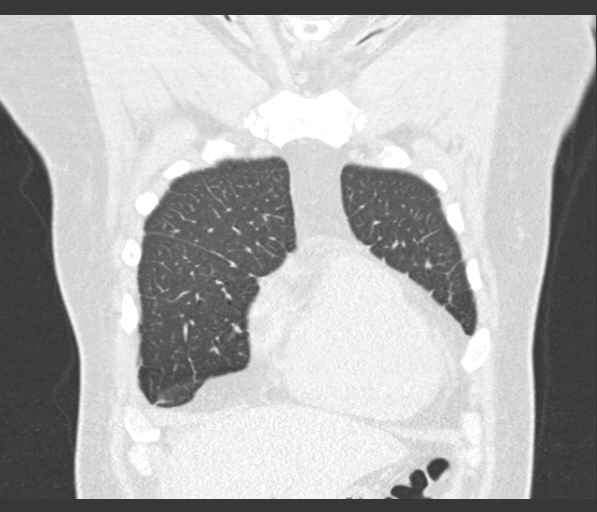
[im 59/146  lung]
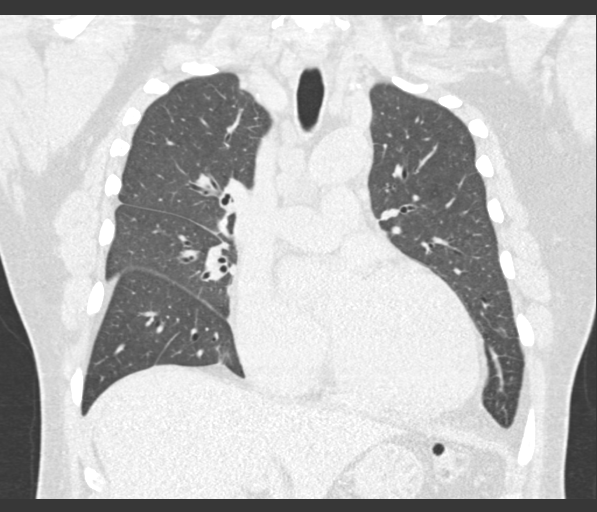
[im 88/146  lung]
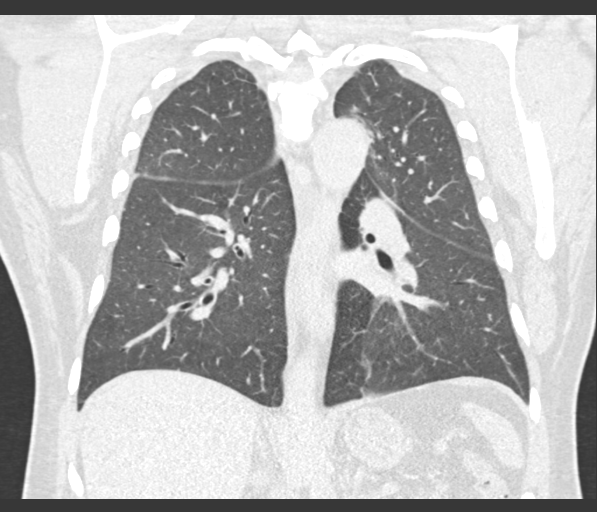

[15 of 36 positions shown; findings below may reference images not displayed]

FINDINGS: Cardiovascular: Left anterior descending coronary artery stenting
has been performed. Global cardiac size within normal limits. No
pericardial effusion. Central pulmonary arteries are mildly dilated
in keeping with changes of pulmonary arterial hypertension. Mild
atherosclerotic calcification within the thoracic aorta. No aortic
aneurysm.

Mediastinum/Nodes: Thyroid unremarkable. Pathologic mediastinal
adenopathy is present with multiple right paratracheal, prevascular,
and aortopulmonary window lymph nodes measuring up to 16 mm in short
axis diameter. This is new from prior examination and is
nonspecific. This may be reactive, inflammatory, or
lymphoproliferative in nature. The esophagus is unremarkable.

Lungs/Pleura: Moderate right and trace left pleural effusions are
present. Trace superimposed interstitial pulmonary edema is present.
No focal consolidation. No pneumothorax. No central obstructing
lesion.

Upper Abdomen: No acute abnormality.

Musculoskeletal: No chest wall mass or suspicious bone lesions
identified.
IMPRESSION: Trace pulmonary edema and small bilateral pleural effusions, right
greater than left, possibly reflecting changes of a mild cardiogenic
failure.

Interval development of pathologic mediastinal adenopathy,
nonspecific. Follow-up imaging in 3 months may be helpful to
document resolution, once the patient's acute issues have resolved.
Alternatively, these could be further assessed with PET CT
examination to assess metabolic activity.

Morphologic changes of pulmonary arterial hypertension.

Aortic Atherosclerosis ([id number]-ZSC.C).  Is

## 2024-04-26 IMAGING — CT CT ABD-PELV W/ CM
2 of 5 series · 16 of 46 positions shown, 18 images · IV contrast (Omnipaque)
Comparison: Abdominopelvic CT 03/06/2020.

CLINICAL DATA: Abdominal pain, acute nonlocalized.

EXAM:
CT ABDOMEN AND PELVIS WITH CONTRAST
TECHNIQUE: Multidetector CT imaging of the abdomen and pelvis was performed
using the standard protocol following bolus administration of
intravenous contrast.

[Series 2: axial st · axial · 0.98mm/px · z∈[-596,-176]mm · 13 of 96 slices shown, 15 images]
[im 6/96  soft-tissue]
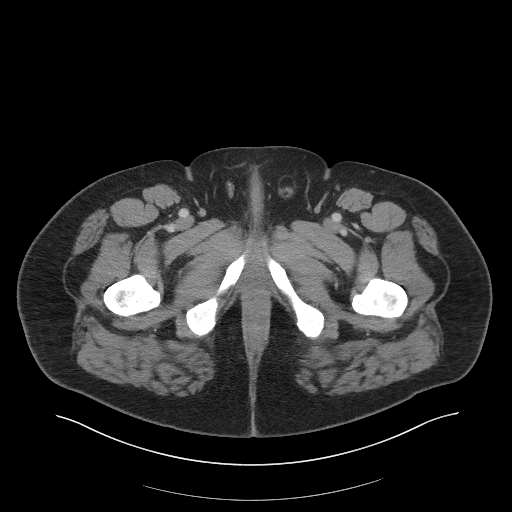
[im 6/96  bone]
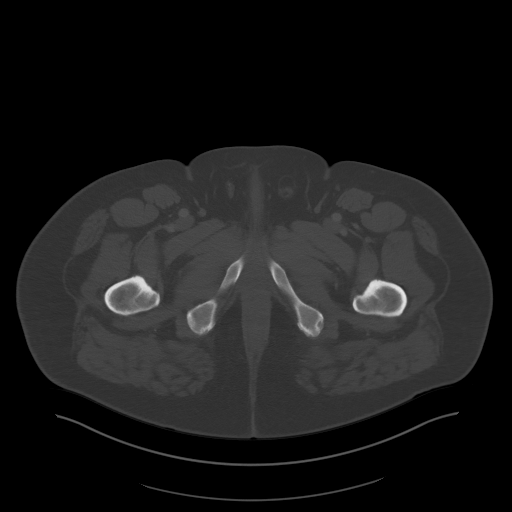
[im 11/96  soft-tissue]
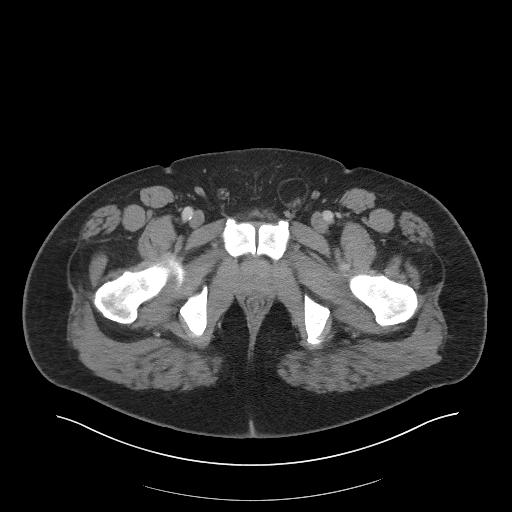
[im 22/96  soft-tissue]
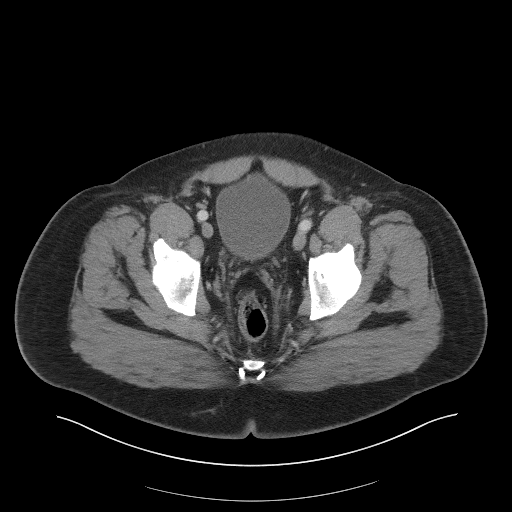
[im 27/96  soft-tissue]
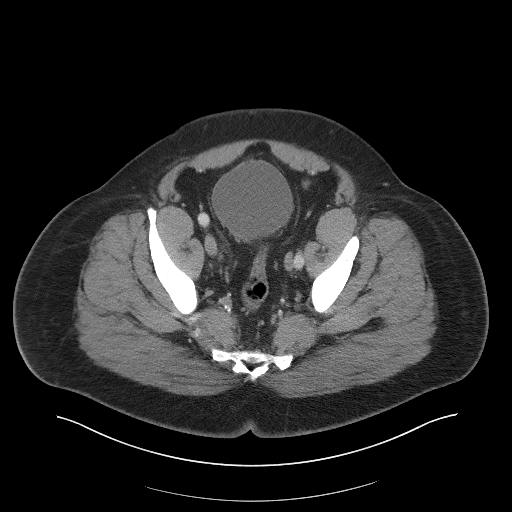
[im 32/96  soft-tissue]
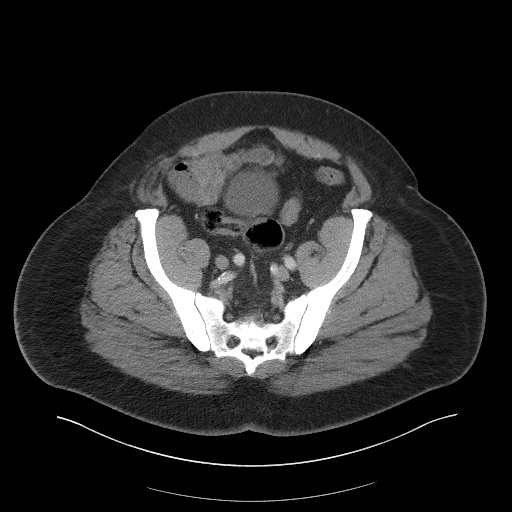
[im 43/96  soft-tissue]
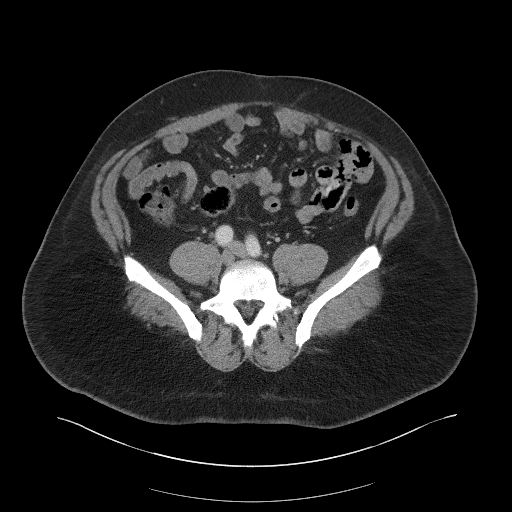
[im 48/96  soft-tissue]
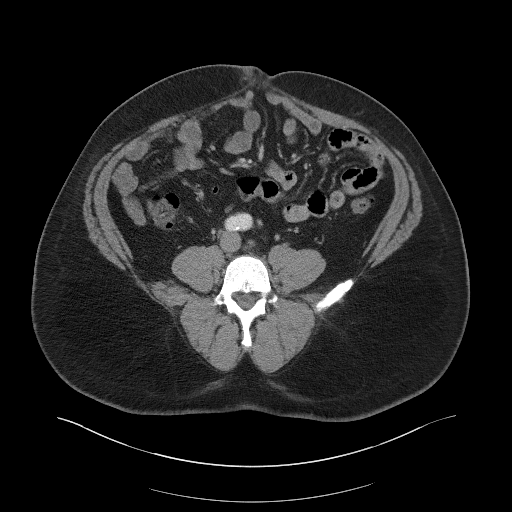
[im 53/96  soft-tissue]
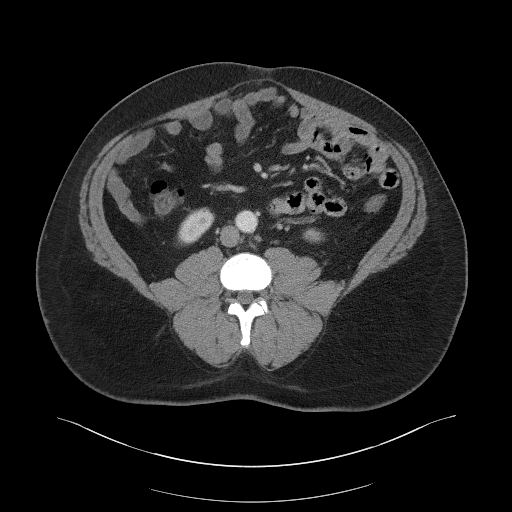
[im 64/96  soft-tissue]
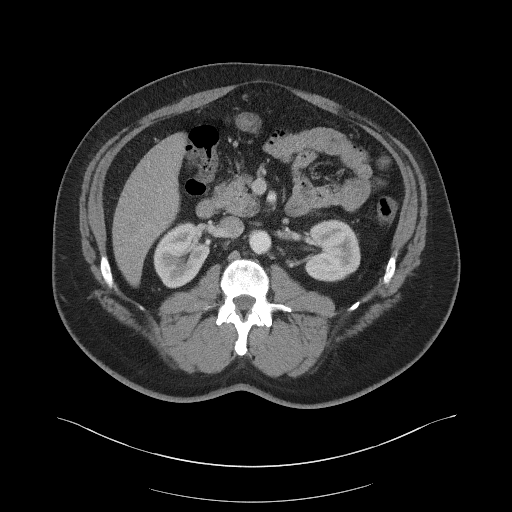
[im 64/96  bone]
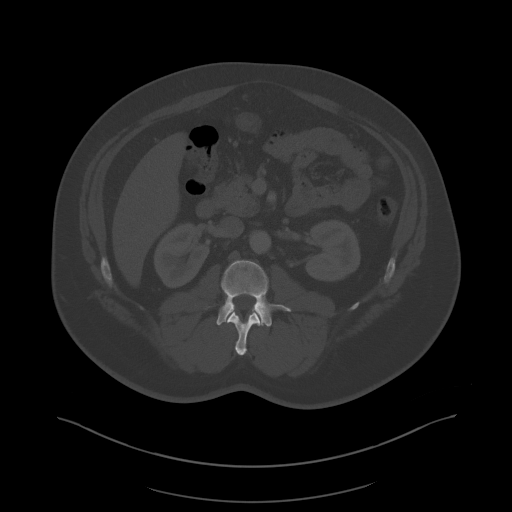
[im 69/96  soft-tissue]
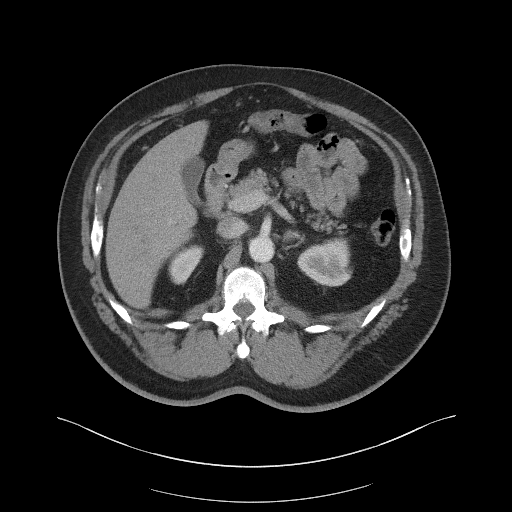
[im 74/96  soft-tissue]
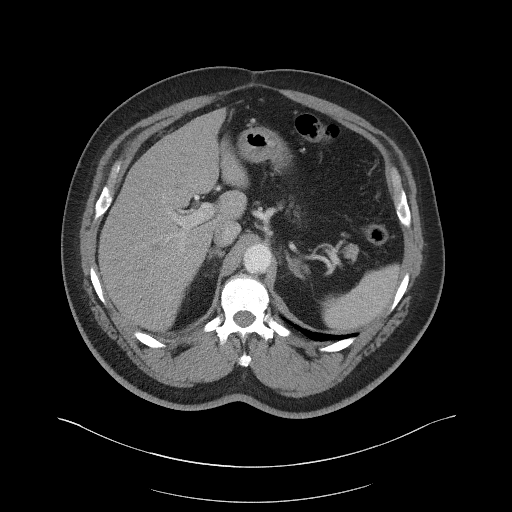
[im 85/96  soft-tissue]
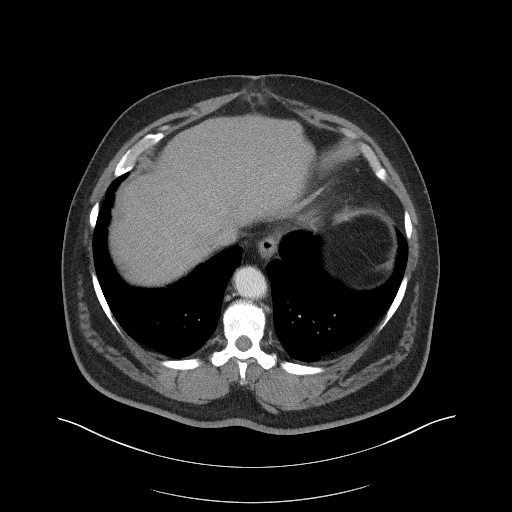
[im 90/96  soft-tissue]
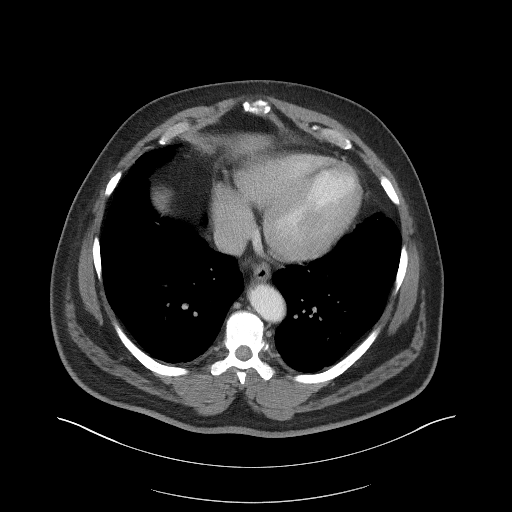

[Series 5: coronal st · coronal · 0.99mm/px · 3 of 140 slices shown]
[im 47/140  soft-tissue]
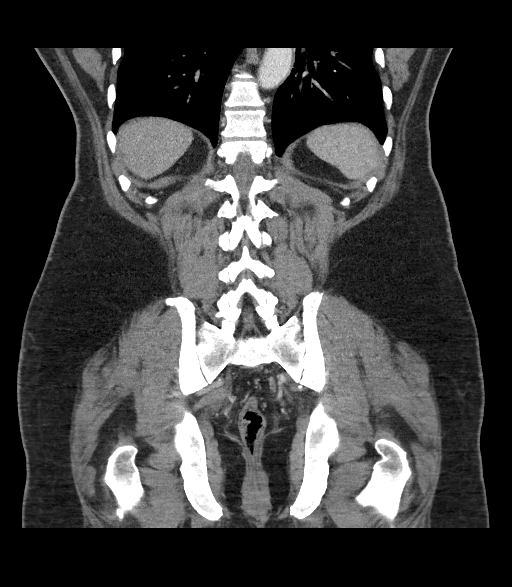
[im 62/140  soft-tissue]
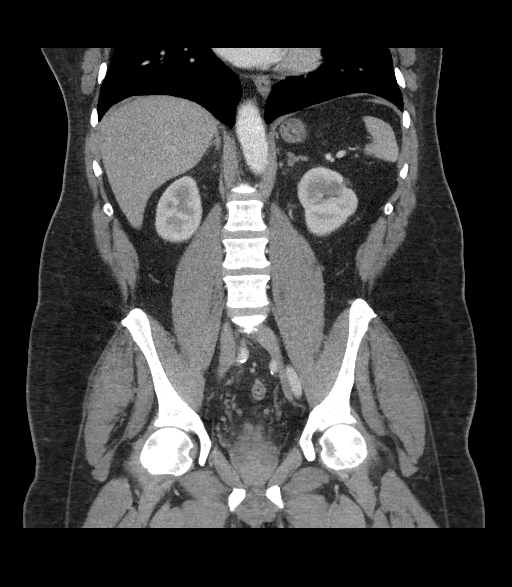
[im 78/140  soft-tissue]
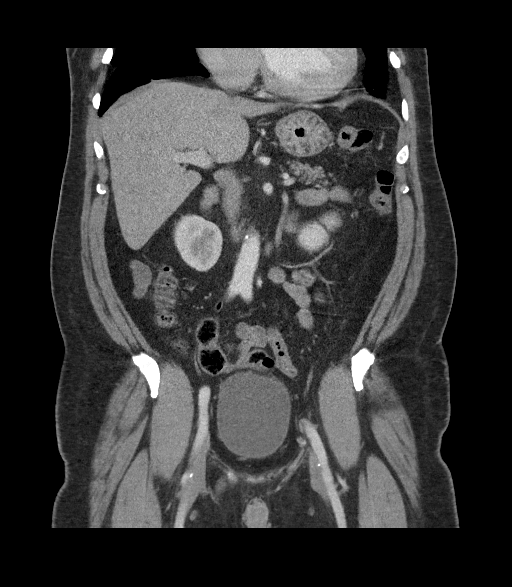

[16 of 46 positions shown; findings below may reference images not displayed]

RADIATION DOSE REDUCTION: This exam was performed according to the
departmental dose-optimization program which includes automated
exposure control, adjustment of the mA and/or kV according to
patient size and/or use of iterative reconstruction technique.

CONTRAST:  100mL OMNIPAQUE IOHEXOL 300 MG/ML  SOLN
FINDINGS: Lower chest: Previously demonstrated right pleural effusion has
resolved. The lung bases are clear. Chronic distal interventricular
septal thinning. The heart size is normal. No significant
pericardial fluid.

Hepatobiliary: The liver is normal in density without suspicious
focal abnormality. No evidence of gallstones, gallbladder wall
thickening or biliary dilatation.

Pancreas: Unremarkable. No pancreatic ductal dilatation or
surrounding inflammatory changes.

Spleen: Normal in size without focal abnormality.

Adrenals/Urinary Tract: Both adrenal glands appear normal. The
previously demonstrated probable infarct or focal pyelonephritis
involving the upper pole of the left kidney and associated
perinephric soft tissue stranding have resolved. No evidence of
renal mass, urinary tract calculus or hydronephrosis. The bladder
appears unremarkable.

Stomach/Bowel: No enteric contrast administered. The stomach appears
unremarkable for its degree of distension. No evidence of bowel wall
thickening, distention or surrounding inflammatory change. The
appendix appears normal.

Vascular/Lymphatic: There are no enlarged abdominal or pelvic lymph
nodes. Mild aortic and branch vessel atherosclerosis. No acute
vascular findings.

Reproductive: The prostate gland and seminal vesicles appear normal.

Other: Stable small umbilical hernia containing fat. Stable small
left inguinal hernia containing fat. No ascites or free air.

Musculoskeletal: No acute or significant osseous findings.
IMPRESSION: 1. No acute findings or explanation for the patient's symptoms.
2. Interval resolution of previously demonstrated process involving
the upper pole of the left kidney, likely representing an infarct or
focal pyelonephritis. No acute renal findings.
3. Resolved right pleural effusion.
4.  Aortic Atherosclerosis ([id number].R).
